# Patient Record
Sex: Male | Born: 1980 | Race: Black or African American | Hispanic: No | Marital: Single | Smoking: Current every day smoker
Health system: Southern US, Community
[De-identification: ages and names within clinical notes are randomized; demographics above are authoritative.]

## PROBLEM LIST (undated history)

## (undated) DIAGNOSIS — H9191 Unspecified hearing loss, right ear: Secondary | ICD-10-CM

## (undated) DIAGNOSIS — N200 Calculus of kidney: Secondary | ICD-10-CM

## (undated) DIAGNOSIS — K2960 Other gastritis without bleeding: Secondary | ICD-10-CM

## (undated) DIAGNOSIS — R1115 Cyclical vomiting syndrome unrelated to migraine: Secondary | ICD-10-CM

## (undated) DIAGNOSIS — T39395A Adverse effect of other nonsteroidal anti-inflammatory drugs [NSAID], initial encounter: Secondary | ICD-10-CM

## (undated) DIAGNOSIS — I4891 Unspecified atrial fibrillation: Secondary | ICD-10-CM

## (undated) DIAGNOSIS — I10 Essential (primary) hypertension: Secondary | ICD-10-CM

## (undated) HISTORY — PX: ESOPHAGOGASTRODUODENOSCOPY: SHX1529

## (undated) HISTORY — PX: CHOLECYSTECTOMY: SHX55

## (undated) HISTORY — PX: INNER EAR SURGERY: SHX679

---

## 2008-07-27 ENCOUNTER — Emergency Department: Payer: Self-pay | Admitting: Unknown Physician Specialty

## 2010-05-21 ENCOUNTER — Emergency Department: Payer: Self-pay | Admitting: Unknown Physician Specialty

## 2010-05-25 ENCOUNTER — Emergency Department: Payer: Self-pay | Admitting: Emergency Medicine

## 2010-05-26 ENCOUNTER — Emergency Department: Payer: Self-pay | Admitting: Emergency Medicine

## 2011-11-02 HISTORY — PX: ESOPHAGOGASTRODUODENOSCOPY: SHX1529

## 2011-11-16 ENCOUNTER — Emergency Department: Payer: Self-pay | Admitting: *Deleted

## 2011-11-16 LAB — TROPONIN I: Troponin-I: 0.04 ng/mL

## 2011-11-16 LAB — CBC
HCT: 52.9 % — ABNORMAL HIGH (ref 40.0–52.0)
HGB: 17.4 g/dL (ref 13.0–18.0)
MCH: 30.7 pg (ref 26.0–34.0)
MCHC: 32.8 g/dL (ref 32.0–36.0)
Platelet: 260 10*3/uL (ref 150–440)
RBC: 5.66 10*6/uL (ref 4.40–5.90)
WBC: 15.3 10*3/uL — ABNORMAL HIGH (ref 3.8–10.6)

## 2011-11-16 LAB — AMYLASE: Amylase: 49 U/L (ref 25–115)

## 2011-11-16 LAB — COMPREHENSIVE METABOLIC PANEL
Alkaline Phosphatase: 93 U/L (ref 50–136)
Bilirubin,Total: 1.2 mg/dL — ABNORMAL HIGH (ref 0.2–1.0)
Creatinine: 1.67 mg/dL — ABNORMAL HIGH (ref 0.60–1.30)
Osmolality: 278 (ref 275–301)
Potassium: 3.4 mmol/L — ABNORMAL LOW (ref 3.5–5.1)
SGPT (ALT): 45 U/L
Sodium: 137 mmol/L (ref 136–145)

## 2011-11-16 LAB — CK TOTAL AND CKMB (NOT AT ARMC)
CK, Total: 1587 U/L — ABNORMAL HIGH (ref 35–232)
CK-MB: 4.9 ng/mL — ABNORMAL HIGH (ref 0.5–3.6)

## 2011-11-17 LAB — COMPREHENSIVE METABOLIC PANEL
Albumin: 5.6 g/dL — ABNORMAL HIGH (ref 3.4–5.0)
Alkaline Phosphatase: 86 U/L (ref 50–136)
BUN: 13 mg/dL (ref 7–18)
Bilirubin,Total: 1.3 mg/dL — ABNORMAL HIGH (ref 0.2–1.0)
Co2: 20 mmol/L — ABNORMAL LOW (ref 21–32)
Creatinine: 1.65 mg/dL — ABNORMAL HIGH (ref 0.60–1.30)
EGFR (African American): >60
Osmolality: 285 (ref 275–301)
Potassium: 3.3 mmol/L — ABNORMAL LOW (ref 3.5–5.1)
SGOT(AST): 45 U/L — ABNORMAL HIGH (ref 15–37)
Sodium: 141 mmol/L (ref 136–145)
Total Protein: 9.6 g/dL — ABNORMAL HIGH (ref 6.4–8.2)

## 2011-11-17 LAB — URINALYSIS, COMPLETE
Nitrite: NEGATIVE
Ph: 5 (ref 4.5–8.0)
Protein: >=500
Squamous Epithelial: NONE SEEN

## 2011-11-17 LAB — LIPASE, BLOOD: Lipase: 111 U/L (ref 73–393)

## 2011-11-17 LAB — CBC
HCT: 52.7 % — ABNORMAL HIGH (ref 40.0–52.0)
HGB: 17.7 g/dL (ref 13.0–18.0)
MCH: 31.7 pg (ref 26.0–34.0)
MCHC: 33.6 g/dL (ref 32.0–36.0)
WBC: 15.6 10*3/uL — ABNORMAL HIGH (ref 3.8–10.6)

## 2011-11-18 ENCOUNTER — Inpatient Hospital Stay: Payer: Self-pay | Admitting: Internal Medicine

## 2011-11-18 LAB — URINALYSIS, COMPLETE
Bacteria: NONE SEEN
Bilirubin,UR: NEGATIVE
Blood: NEGATIVE
Ketone: NEGATIVE
Nitrite: NEGATIVE
Ph: 6 (ref 4.5–8.0)
RBC,UR: 2 /HPF (ref 0–5)
Specific Gravity: 1.028 (ref 1.003–1.030)
Squamous Epithelial: NONE SEEN
WBC UR: 3 /HPF (ref 0–5)

## 2011-11-18 LAB — PROTEIN / CREATININE RATIO, URINE
Creatinine, Urine: 448.1 mg/dL — ABNORMAL HIGH (ref 30.0–125.0)
Protein, Random Urine: 29 mg/dL — ABNORMAL HIGH (ref 0–12)
Protein/Creat. Ratio: 65 mg/gCREAT (ref 0–200)

## 2011-11-18 LAB — PROTIME-INR: Prothrombin Time: 12.9 secs (ref 11.5–14.7)

## 2011-11-18 LAB — HEMOGLOBIN: HGB: 15.6 g/dL (ref 13.0–18.0)

## 2011-11-19 LAB — CBC WITH DIFFERENTIAL/PLATELET
Basophil #: 0 10*3/uL (ref 0.0–0.1)
Basophil %: 0.5 %
Eosinophil #: 0.1 10*3/uL (ref 0.0–0.7)
Eosinophil %: 0.8 %
HCT: 41.3 % (ref 40.0–52.0)
HGB: 13.7 g/dL (ref 13.0–18.0)
Lymphocyte %: 32.3 %
MCH: 31.6 pg (ref 26.0–34.0)
MCHC: 33.2 g/dL (ref 32.0–36.0)
MCV: 95 fL (ref 80–100)
Monocyte #: 1 x10 3/mm (ref 0.2–1.0)
Monocyte %: 12.9 %
Neutrophil %: 53.5 %
Platelet: 157 10*3/uL (ref 150–440)
RDW: 12.5 % (ref 11.5–14.5)
WBC: 7.5 10*3/uL (ref 3.8–10.6)

## 2011-11-19 LAB — BASIC METABOLIC PANEL
Anion Gap: 7 (ref 7–16)
BUN: 10 mg/dL (ref 7–18)
Calcium, Total: 8.3 mg/dL — ABNORMAL LOW (ref 8.5–10.1)
Chloride: 106 mmol/L (ref 98–107)
Creatinine: 1.11 mg/dL (ref 0.60–1.30)
EGFR (African American): >60
Potassium: 3.4 mmol/L — ABNORMAL LOW (ref 3.5–5.1)
Sodium: 139 mmol/L (ref 136–145)

## 2011-11-19 LAB — MAGNESIUM: Magnesium: 1.4 mg/dL — ABNORMAL LOW

## 2011-11-19 LAB — CK: CK, Total: 765 U/L — ABNORMAL HIGH (ref 35–232)

## 2011-11-19 LAB — PROTEIN ELECTROPHORESIS(ARMC)

## 2013-01-07 ENCOUNTER — Emergency Department: Payer: Self-pay | Admitting: Emergency Medicine

## 2013-01-07 LAB — URINALYSIS, COMPLETE
Ketone: NEGATIVE
Nitrite: NEGATIVE
Ph: 6 (ref 4.5–8.0)
Protein: 30
Squamous Epithelial: <1
WBC UR: 4 /HPF (ref 0–5)

## 2013-01-07 LAB — CBC
HCT: 45.2 % (ref 40.0–52.0)
MCH: 32.1 pg (ref 26.0–34.0)
MCHC: 34.8 g/dL (ref 32.0–36.0)
MCV: 92 fL (ref 80–100)
RBC: 4.9 10*6/uL (ref 4.40–5.90)
WBC: 8.5 10*3/uL (ref 3.8–10.6)

## 2013-01-07 LAB — BASIC METABOLIC PANEL
Anion Gap: 7 (ref 7–16)
Calcium, Total: 9.4 mg/dL (ref 8.5–10.1)
Creatinine: 1.17 mg/dL (ref 0.60–1.30)
EGFR (Non-African Amer.): >60
Glucose: 107 mg/dL — ABNORMAL HIGH (ref 65–99)
Osmolality: 281 (ref 275–301)
Potassium: 3.4 mmol/L — ABNORMAL LOW (ref 3.5–5.1)
Sodium: 141 mmol/L (ref 136–145)

## 2013-01-07 LAB — CBC WITH DIFFERENTIAL/PLATELET
Basophil #: 0.1 10*3/uL (ref 0.0–0.1)
Eosinophil #: 0.2 10*3/uL (ref 0.0–0.7)
Eosinophil %: 1.9 %
Lymphocyte #: 3.6 10*3/uL (ref 1.0–3.6)
Monocyte #: 0.8 x10 3/mm (ref 0.2–1.0)
Monocyte %: 9.6 %
Neutrophil #: 3.8 10*3/uL (ref 1.4–6.5)
Neutrophil %: 44.5 %

## 2013-01-09 ENCOUNTER — Emergency Department: Payer: Self-pay | Admitting: Emergency Medicine

## 2013-01-09 LAB — COMPREHENSIVE METABOLIC PANEL
Albumin: 4.4 g/dL (ref 3.4–5.0)
Alkaline Phosphatase: 66 U/L (ref 50–136)
Anion Gap: 6 — ABNORMAL LOW (ref 7–16)
BUN: 9 mg/dL (ref 7–18)
Chloride: 106 mmol/L (ref 98–107)
EGFR (Non-African Amer.): >60
Glucose: 109 mg/dL — ABNORMAL HIGH (ref 65–99)
Potassium: 3 mmol/L — ABNORMAL LOW (ref 3.5–5.1)
SGOT(AST): 21 U/L (ref 15–37)
Sodium: 137 mmol/L (ref 136–145)

## 2013-01-09 LAB — CBC
MCH: 31.2 pg (ref 26.0–34.0)
MCHC: 34.2 g/dL (ref 32.0–36.0)
MCV: 91 fL (ref 80–100)
Platelet: 183 10*3/uL (ref 150–440)
RBC: 5.07 10*6/uL (ref 4.40–5.90)
WBC: 10.8 10*3/uL — ABNORMAL HIGH (ref 3.8–10.6)

## 2013-01-09 LAB — LIPASE, BLOOD: Lipase: 146 U/L (ref 73–393)

## 2013-08-15 ENCOUNTER — Emergency Department: Payer: Self-pay | Admitting: Emergency Medicine

## 2013-08-15 LAB — URINALYSIS, COMPLETE
Bacteria: NONE SEEN
Bilirubin,UR: NEGATIVE
Blood: NEGATIVE
Glucose,UR: NEGATIVE mg/dL (ref 0–75)
LEUKOCYTE ESTERASE: NEGATIVE
Nitrite: NEGATIVE
PH: 8 (ref 4.5–8.0)
Specific Gravity: 1.029 (ref 1.003–1.030)
WBC UR: 1 /HPF (ref 0–5)

## 2013-08-15 LAB — COMPREHENSIVE METABOLIC PANEL
ALBUMIN: 4.5 g/dL (ref 3.4–5.0)
ALK PHOS: 75 U/L
ALT: 31 U/L (ref 12–78)
AST: 32 U/L (ref 15–37)
Anion Gap: 7 (ref 7–16)
BUN: 10 mg/dL (ref 7–18)
Bilirubin,Total: 0.5 mg/dL (ref 0.2–1.0)
CHLORIDE: 104 mmol/L (ref 98–107)
CO2: 25 mmol/L (ref 21–32)
CREATININE: 1.11 mg/dL (ref 0.60–1.30)
Calcium, Total: 9.9 mg/dL (ref 8.5–10.1)
EGFR (African American): >60
EGFR (Non-African Amer.): >60
Glucose: 106 mg/dL — ABNORMAL HIGH (ref 65–99)
Osmolality: 271 (ref 275–301)
POTASSIUM: 3.2 mmol/L — AB (ref 3.5–5.1)
Sodium: 136 mmol/L (ref 136–145)
TOTAL PROTEIN: 8.5 g/dL — AB (ref 6.4–8.2)

## 2013-08-15 LAB — CBC
HCT: 48.6 % (ref 40.0–52.0)
HGB: 16.5 g/dL (ref 13.0–18.0)
MCH: 31 pg (ref 26.0–34.0)
MCHC: 34 g/dL (ref 32.0–36.0)
MCV: 91 fL (ref 80–100)
PLATELETS: 213 10*3/uL (ref 150–440)
RBC: 5.32 10*6/uL (ref 4.40–5.90)
RDW: 12.3 % (ref 11.5–14.5)
WBC: 11.8 10*3/uL — ABNORMAL HIGH (ref 3.8–10.6)

## 2013-08-15 LAB — LIPASE, BLOOD: LIPASE: 149 U/L (ref 73–393)

## 2014-01-01 ENCOUNTER — Emergency Department: Payer: Self-pay | Admitting: Emergency Medicine

## 2014-01-01 LAB — CBC WITH DIFFERENTIAL/PLATELET
BASOS PCT: 0.7 %
Basophil #: 0.1 10*3/uL (ref 0.0–0.1)
EOS PCT: 0.6 %
Eosinophil #: 0.1 10*3/uL (ref 0.0–0.7)
HCT: 51.1 % (ref 40.0–52.0)
HGB: 16.8 g/dL (ref 13.0–18.0)
LYMPHS PCT: 17.4 %
Lymphocyte #: 2.8 10*3/uL (ref 1.0–3.6)
MCH: 30.9 pg (ref 26.0–34.0)
MCHC: 32.9 g/dL (ref 32.0–36.0)
MCV: 94 fL (ref 80–100)
MONO ABS: 1.4 x10 3/mm — AB (ref 0.2–1.0)
Monocyte %: 8.8 %
NEUTROS ABS: 11.6 10*3/uL — AB (ref 1.4–6.5)
Neutrophil %: 72.5 %
Platelet: 225 10*3/uL (ref 150–440)
RBC: 5.44 10*6/uL (ref 4.40–5.90)
RDW: 12.5 % (ref 11.5–14.5)
WBC: 16.1 10*3/uL — ABNORMAL HIGH (ref 3.8–10.6)

## 2014-01-01 LAB — COMPREHENSIVE METABOLIC PANEL
ALK PHOS: 86 U/L
ALT: 26 U/L (ref 12–78)
Albumin: 5.3 g/dL — ABNORMAL HIGH (ref 3.4–5.0)
Anion Gap: 10 (ref 7–16)
BUN: 12 mg/dL (ref 7–18)
Bilirubin,Total: 0.9 mg/dL (ref 0.2–1.0)
CALCIUM: 10.8 mg/dL — AB (ref 8.5–10.1)
Chloride: 103 mmol/L (ref 98–107)
Co2: 25 mmol/L (ref 21–32)
Creatinine: 1.42 mg/dL — ABNORMAL HIGH (ref 0.60–1.30)
EGFR (African American): >60
EGFR (Non-African Amer.): >60
Glucose: 146 mg/dL — ABNORMAL HIGH (ref 65–99)
Osmolality: 278 (ref 275–301)
Potassium: 3.5 mmol/L (ref 3.5–5.1)
SGOT(AST): 36 U/L (ref 15–37)
Sodium: 138 mmol/L (ref 136–145)
Total Protein: 9.6 g/dL — ABNORMAL HIGH (ref 6.4–8.2)

## 2014-01-01 LAB — URINALYSIS, COMPLETE
BACTERIA: NONE SEEN
BILIRUBIN, UR: NEGATIVE
Glucose,UR: NEGATIVE mg/dL (ref 0–75)
Leukocyte Esterase: NEGATIVE
Nitrite: NEGATIVE
Ph: 5 (ref 4.5–8.0)
RBC,UR: 1 /HPF (ref 0–5)
SPECIFIC GRAVITY: 1.028 (ref 1.003–1.030)
SQUAMOUS EPITHELIAL: NONE SEEN
WBC UR: 8 /HPF (ref 0–5)

## 2014-01-01 LAB — LIPASE, BLOOD: LIPASE: 129 U/L (ref 73–393)

## 2014-01-01 LAB — TROPONIN I

## 2014-03-13 ENCOUNTER — Emergency Department: Payer: Self-pay | Admitting: Student

## 2014-03-13 LAB — COMPREHENSIVE METABOLIC PANEL
ANION GAP: 9 (ref 7–16)
Albumin: 5 g/dL (ref 3.4–5.0)
Alkaline Phosphatase: 94 U/L
BUN: 10 mg/dL (ref 7–18)
Bilirubin,Total: 0.7 mg/dL (ref 0.2–1.0)
CREATININE: 1.49 mg/dL — AB (ref 0.60–1.30)
Calcium, Total: 10.3 mg/dL — ABNORMAL HIGH (ref 8.5–10.1)
Chloride: 104 mmol/L (ref 98–107)
Co2: 29 mmol/L (ref 21–32)
Glucose: 128 mg/dL — ABNORMAL HIGH (ref 65–99)
Osmolality: 284 (ref 275–301)
Potassium: 3.2 mmol/L — ABNORMAL LOW (ref 3.5–5.1)
SGOT(AST): 30 U/L (ref 15–37)
SGPT (ALT): 30 U/L
Sodium: 142 mmol/L (ref 136–145)
TOTAL PROTEIN: 9.4 g/dL — AB (ref 6.4–8.2)

## 2014-03-13 LAB — CBC WITH DIFFERENTIAL/PLATELET
BASOS PCT: 0.8 %
Basophil #: 0.1 10*3/uL (ref 0.0–0.1)
EOS PCT: 1.5 %
Eosinophil #: 0.3 10*3/uL (ref 0.0–0.7)
HCT: 50 % (ref 40.0–52.0)
HGB: 16.2 g/dL (ref 13.0–18.0)
Lymphocyte #: 3.8 10*3/uL — ABNORMAL HIGH (ref 1.0–3.6)
Lymphocyte %: 21.4 %
MCH: 31.1 pg (ref 26.0–34.0)
MCHC: 32.4 g/dL (ref 32.0–36.0)
MCV: 96 fL (ref 80–100)
Monocyte #: 1.2 x10 3/mm — ABNORMAL HIGH (ref 0.2–1.0)
Monocyte %: 7 %
NEUTROS ABS: 12.1 10*3/uL — AB (ref 1.4–6.5)
Neutrophil %: 69.3 %
PLATELETS: 234 10*3/uL (ref 150–440)
RBC: 5.21 10*6/uL (ref 4.40–5.90)
RDW: 12.8 % (ref 11.5–14.5)
WBC: 17.5 10*3/uL — ABNORMAL HIGH (ref 3.8–10.6)

## 2014-03-13 LAB — LIPASE, BLOOD: Lipase: 146 U/L (ref 73–393)

## 2014-07-04 ENCOUNTER — Emergency Department (HOSPITAL_COMMUNITY): Payer: Self-pay

## 2014-07-04 ENCOUNTER — Emergency Department (HOSPITAL_COMMUNITY)
Admission: EM | Admit: 2014-07-04 | Discharge: 2014-07-05 | Disposition: A | Discharge status: Home / Self Care | Payer: Self-pay | Attending: Emergency Medicine | Admitting: Emergency Medicine

## 2014-07-04 ENCOUNTER — Encounter (HOSPITAL_COMMUNITY): Payer: Self-pay | Admitting: Emergency Medicine

## 2014-07-04 DIAGNOSIS — R197 Diarrhea, unspecified: Secondary | ICD-10-CM | POA: Insufficient documentation

## 2014-07-04 DIAGNOSIS — Z720 Tobacco use: Secondary | ICD-10-CM | POA: Insufficient documentation

## 2014-07-04 DIAGNOSIS — R61 Generalized hyperhidrosis: Secondary | ICD-10-CM | POA: Insufficient documentation

## 2014-07-04 DIAGNOSIS — Z9049 Acquired absence of other specified parts of digestive tract: Secondary | ICD-10-CM | POA: Insufficient documentation

## 2014-07-04 DIAGNOSIS — R1013 Epigastric pain: Secondary | ICD-10-CM | POA: Insufficient documentation

## 2014-07-04 DIAGNOSIS — R109 Unspecified abdominal pain: Secondary | ICD-10-CM

## 2014-07-04 DIAGNOSIS — I4891 Unspecified atrial fibrillation: Secondary | ICD-10-CM | POA: Insufficient documentation

## 2014-07-04 DIAGNOSIS — R112 Nausea with vomiting, unspecified: Secondary | ICD-10-CM | POA: Insufficient documentation

## 2014-07-04 DIAGNOSIS — R6883 Chills (without fever): Secondary | ICD-10-CM | POA: Insufficient documentation

## 2014-07-04 LAB — URINALYSIS, ROUTINE W REFLEX MICROSCOPIC
Glucose, UA: NEGATIVE mg/dL
Leukocytes, UA: NEGATIVE
NITRITE: NEGATIVE
Protein, ur: 100 mg/dL — AB
Specific Gravity, Urine: 1.035 — ABNORMAL HIGH (ref 1.005–1.030)
UROBILINOGEN UA: 0.2 mg/dL (ref 0.0–1.0)
pH: 6 (ref 5.0–8.0)

## 2014-07-04 LAB — DIFFERENTIAL
Basophils Absolute: 0 10*3/uL (ref 0.0–0.1)
Basophils Relative: 0 % (ref 0–1)
EOS PCT: 1 % (ref 0–5)
Eosinophils Absolute: 0.1 10*3/uL (ref 0.0–0.7)
LYMPHS PCT: 16 % (ref 12–46)
Lymphs Abs: 2.4 10*3/uL (ref 0.7–4.0)
Monocytes Absolute: 0.9 10*3/uL (ref 0.1–1.0)
Monocytes Relative: 6 % (ref 3–12)
Neutro Abs: 11.1 10*3/uL — ABNORMAL HIGH (ref 1.7–7.7)
Neutrophils Relative %: 77 % (ref 43–77)

## 2014-07-04 LAB — COMPREHENSIVE METABOLIC PANEL
ALK PHOS: 64 U/L (ref 39–117)
ALT: 17 U/L (ref 0–53)
ANION GAP: 18 — AB (ref 5–15)
AST: 22 U/L (ref 0–37)
Albumin: 5 g/dL (ref 3.5–5.2)
BUN: 13 mg/dL (ref 6–23)
CALCIUM: 10.4 mg/dL (ref 8.4–10.5)
CO2: 23 mEq/L (ref 19–32)
Chloride: 101 mEq/L (ref 96–112)
Creatinine, Ser: 1.13 mg/dL (ref 0.50–1.35)
GFR calc non Af Amer: 83 mL/min — ABNORMAL LOW (ref >90)
GLUCOSE: 128 mg/dL — AB (ref 70–99)
POTASSIUM: 3.3 meq/L — AB (ref 3.7–5.3)
Sodium: 142 mEq/L (ref 137–147)
TOTAL PROTEIN: 8.6 g/dL — AB (ref 6.0–8.3)
Total Bilirubin: 0.5 mg/dL (ref 0.3–1.2)

## 2014-07-04 LAB — CBC
HCT: 46.8 % (ref 39.0–52.0)
Hemoglobin: 16.6 g/dL (ref 13.0–17.0)
MCH: 32.5 pg (ref 26.0–34.0)
MCHC: 35.5 g/dL (ref 30.0–36.0)
MCV: 91.8 fL (ref 78.0–100.0)
Platelets: 219 10*3/uL (ref 150–400)
RBC: 5.1 MIL/uL (ref 4.22–5.81)
RDW: 12 % (ref 11.5–15.5)
WBC: 14.3 10*3/uL — ABNORMAL HIGH (ref 4.0–10.5)

## 2014-07-04 LAB — I-STAT TROPONIN, ED: TROPONIN I, POC: 0 ng/mL (ref 0.00–0.08)

## 2014-07-04 LAB — URINE MICROSCOPIC-ADD ON

## 2014-07-04 LAB — RAPID URINE DRUG SCREEN, HOSP PERFORMED
AMPHETAMINES: NOT DETECTED
Barbiturates: NOT DETECTED
Benzodiazepines: NOT DETECTED
Cocaine: NOT DETECTED
OPIATES: NOT DETECTED
Tetrahydrocannabinol: POSITIVE — AB

## 2014-07-04 LAB — LIPASE, BLOOD: Lipase: 35 U/L (ref 11–59)

## 2014-07-04 MED ORDER — HALOPERIDOL LACTATE 5 MG/ML IJ SOLN
2.5000 mg | Freq: Once | INTRAMUSCULAR | Status: AC
  Administered 2014-07-04: 2.5 mg via INTRAVENOUS
  Filled 2014-07-04: qty 1

## 2014-07-04 MED ORDER — SODIUM CHLORIDE 0.9 % IV BOLUS (SEPSIS)
1000.0000 mL | Freq: Once | INTRAVENOUS | Status: AC
  Administered 2014-07-04: 1000 mL via INTRAVENOUS

## 2014-07-04 MED ORDER — HYDROMORPHONE HCL 1 MG/ML IJ SOLN
1.0000 mg | Freq: Once | INTRAMUSCULAR | Status: AC
  Administered 2014-07-04: 1 mg via INTRAVENOUS
  Filled 2014-07-04: qty 1

## 2014-07-04 MED ORDER — DILTIAZEM HCL 25 MG/5ML IV SOLN
20.0000 mg | Freq: Once | INTRAVENOUS | Status: AC
  Administered 2014-07-05: 20 mg via INTRAVENOUS
  Filled 2014-07-04: qty 5

## 2014-07-04 MED ORDER — DIPHENHYDRAMINE HCL 50 MG/ML IJ SOLN
25.0000 mg | Freq: Once | INTRAMUSCULAR | Status: AC
  Administered 2014-07-04: 25 mg via INTRAVENOUS
  Filled 2014-07-04: qty 1

## 2014-07-04 NOTE — ED Provider Notes (Signed)
CSN: [id number]     Arrival date & time 07/04/14  2048 History   First MD Initiated Contact with Patient 07/04/14 2110     Chief Complaint  Patient presents with  . Abdominal Pain  . Chest Pain  . Nausea   (Consider location/radiation/quality/duration/timing/severity/associated sxs/prior Treatment) HPI Brandon Allen is a 33 yo male presenting with report of abd pain onset tonight. He reports appr 4 hr PTA he began having epigastric abd pain, followed by nausea, vomiting and profuse sweating.  He reports vomiting multiple times.  He denies any diarrhea.  He also reports feeling very cold and is wrapped in several blankets.  He states this has happened before and is his usual presentation when he has kidney stones. He denies diarrhea, bloody/bilious emesis, or drug use other than marijuana.   History reviewed. No pertinent past medical history. Past Surgical History  Procedure Laterality Date  . Cholecystectomy     No family history on file. History  Substance Use Topics  . Smoking status: Current Every Day Smoker -- 0.50 packs/day    Types: Cigarettes  . Smokeless tobacco: Not on file  . Alcohol Use: No    Review of Systems  Constitutional: Positive for chills and diaphoresis. Negative for fever.  HENT: Negative for sore throat.   Eyes: Negative for visual disturbance.  Respiratory: Negative for cough and shortness of breath.   Cardiovascular: Negative for chest pain and leg swelling.  Gastrointestinal: Positive for nausea, vomiting, abdominal pain and diarrhea.  Genitourinary: Negative for dysuria.  Musculoskeletal: Negative for myalgias.  Skin: Negative for rash.  Neurological: Negative for weakness, numbness and headaches.    Allergies  Review of patient's allergies indicates no known allergies.  Home Medications   Prior to Admission medications   Not on File   BP 156/102 mmHg  Pulse 80  Temp(Src) 98 F (36.7 C) (Oral)  Resp 16  Ht 5\' 11"  (1.803 m)  Wt 210 lb  (95.255 kg)  BMI 29.30 kg/m2  SpO2 100% Physical Exam  Constitutional: He is oriented to person, place, and time. He appears well-developed and well-nourished. He appears ill. No distress.  HENT:  Head: Normocephalic and atraumatic.  Mouth/Throat: Oropharynx is clear and moist. No oropharyngeal exudate.  Eyes: Conjunctivae are normal.  Neck: Neck supple. No thyromegaly present.  Cardiovascular: Intact distal pulses.  An irregular rhythm present. Tachycardia present.  Exam reveals no gallop and no friction rub.   No murmur heard. Pulmonary/Chest: Effort normal and breath sounds normal. No respiratory distress. He has no wheezes. He has no rales. He exhibits no tenderness.  Abdominal: Soft. He exhibits no distension and no mass. There is no hepatosplenomegaly. There is tenderness in the epigastric area. There is no rigidity, no rebound, no guarding, no CVA tenderness, no tenderness at McBurney's point and negative Murphy's sign.  Musculoskeletal: He exhibits no tenderness.  Lymphadenopathy:    He has no cervical adenopathy.  Neurological: He is alert and oriented to person, place, and time.  Skin: Skin is warm. No rash noted. He is diaphoretic. There is pallor.  Psychiatric: He has a normal mood and affect.  Nursing note and vitals reviewed.   ED Course  Procedures (including critical care time) Labs Review Labs Reviewed  CBC - Abnormal; Notable for the following:    WBC 14.3 (*)    All other components within normal limits  URINALYSIS, ROUTINE W REFLEX MICROSCOPIC - Abnormal; Notable for the following:    Color, Urine AMBER (*)  APPearance CLOUDY (*)    Specific Gravity, Urine 1.035 (*)    Hgb urine dipstick TRACE (*)    Bilirubin Urine SMALL (*)    Ketones, ur >80 (*)    Protein, ur 100 (*)    All other components within normal limits  COMPREHENSIVE METABOLIC PANEL - Abnormal; Notable for the following:    Potassium 3.3 (*)    Glucose, Bld 128 (*)    Total Protein 8.6 (*)     GFR calc non Af Amer 83 (*)    Anion gap 18 (*)    All other components within normal limits  DIFFERENTIAL - Abnormal; Notable for the following:    Neutro Abs 11.1 (*)    All other components within normal limits  URINE RAPID DRUG SCREEN (HOSP PERFORMED) - Abnormal; Notable for the following:    Tetrahydrocannabinol POSITIVE (*)    All other components within normal limits  URINE MICROSCOPIC-ADD ON - Abnormal; Notable for the following:    Squamous Epithelial / LPF FEW (*)    All other components within normal limits  LIPASE, BLOOD  I-STAT TROPOININ, ED    Imaging Review  CT ABDOMEN AND PELVIS WITHOUT CONTRAST  TECHNIQUE: Multidetector CT imaging of the abdomen and pelvis was performed following the standard protocol without IV contrast.  COMPARISON: 03/13/2014  FINDINGS: Lung bases are unremarkable.  The patient is status postcholecystectomy. Unenhanced liver shows no biliary ductal dilatation. Unenhanced pancreas, spleen and adrenal glands are unremarkable. Unenhanced kidneys are symmetrical in size. No nephrolithiasis. No hydronephrosis or hydroureter.  No calcified ureteral calculi. Abdominal aorta is unremarkable.  No small bowel obstruction. No ascites or free air. No adenopathy.  Sagittal images of the spine are unremarkable.  Bilateral distal ureter is unremarkable. No calcified calculi are noted within urinary bladder. No pericecal inflammation. Normal appendix clearly visualized in coronal image 19.  Prostate gland and seminal vesicles are unremarkable. Small nonspecific bilateral inguinal lymph nodes are noted. No destructive bony lesions are noted within pelvis.  IMPRESSION: 1. No nephrolithiasis. No hydronephrosis or hydroureter. 2. No calcified ureteral calculi. 3. Normal appendix. No pericecal inflammation. 4. No calcified calculi are noted within urinary bladder. 5. No small bowel obstruction.  EXAM: PORTABLE CHEST - 1  VIEW  COMPARISON: 05/21/2010  FINDINGS: The heart size and mediastinal contours are within normal limits. Both lungs are clear. The visualized skeletal structures are unremarkable.  IMPRESSION: No active disease.    EKG Interpretation   Date/Time:  Thursday July 05 2014 00:17:40 EST Ventricular Rate:  100 PR Interval:  174 QRS Duration: 99 QT Interval:  363 QTC Calculation: 468 R Axis:   102 Text Interpretation:  Atrial fibrillation Right axis deviation Probable  anteroseptal infarct, old ED PHYSICIAN INTERPRETATION AVAILABLE IN [facility] Confirmed by TEST, Record ([id number]) on 07/07/2014 12:48:11 PM      MDM   Final diagnoses:  Abdominal pain  Atrial fibrillation, new onset  Non-intractable vomiting with nausea, vomiting of unspecified type   33 yo male initially appearing ill and distressed, pale and diaphoretic, reports epigastric abd pain and nausea and vomiting.  Pt and mother report this is a recurrent problem and has been evaluated at [facility] without conclusive findings.  At one visit, his gall bladder was removed and his symptoms returned.  At another visit, they saw a non-obstructive stone and the assumption was the symptoms were because of the stone. Case discussed with Dr. Fayrene Fearing.  His labs and imaging are negative for significant abnormality except  for mild leukocytosis and hypokalemia. He also has elevated ketones and an anion gap consistent with dehydration from vomiting.  His ekg on arrival was sinus rhythm with a rate in the 70s.  During his ED visit he went into afib with rvr which is new onset for him.  His rate was controlled with diltiazem but he did not convert to a sinus rhythm.  Consulted Dr. Tresa EndoKelly (cardiology), who reported anti-coagulation not necessary at this time, but pt should be evaluated in their clinic.  Until pt is seen in clinic, he should be given presciption for diltiazem with instructions how to check heart rate and when to take diltiazem  prn.  Teaching provided and pt returned demonstration of to check pulse and count heart rate.  Pt is well-appearing, in no acute distress and heart rate is between 80s to low 100s.   At end of shift, hand-off report given to Dr. Ranae PalmsYelverton, who will assume care of pt.  Pt is still receiving IV potassium repletion, but plan is to discharge as soon as complete assuming heart rate remains rate controlled and pt is still well-appearing and without complaints as he is currently.      Filed Vitals:   07/04/14 2104 07/04/14 2108 07/04/14 2115 07/04/14 2130  BP:  156/102  134/75  Pulse:  80 155   Temp:  98 F (36.7 C)    TempSrc:  Oral    Resp:  16 16 15   Height:  5\' 11"  (1.803 m)    Weight:  210 lb (95.255 kg)    SpO2: 98% 100% 100%    Meds given in ED:  Medications  sodium chloride 0.9 % bolus 1,000 mL (0 mLs Intravenous Stopped 07/04/14 2327)  HYDROmorphone (DILAUDID) injection 1 mg (1 mg Intravenous Given 07/04/14 2147)  haloperidol lactate (HALDOL) injection 2.5 mg (2.5 mg Intravenous Given 07/04/14 2148)  diphenhydrAMINE (BENADRYL) injection 25 mg (25 mg Intravenous Given 07/04/14 2147)  HYDROmorphone (DILAUDID) injection 1 mg (1 mg Intravenous Given 07/04/14 2223)  sodium chloride 0.9 % bolus 1,000 mL (0 mLs Intravenous Stopped 07/05/14 0113)  diltiazem (CARDIZEM) injection 20 mg (0 mg Intravenous Stopped 07/05/14 0005)  potassium chloride 10 mEq in 100 mL IVPB (0 mEq Intravenous Stopped 07/05/14 0218)  diltiazem (CARDIZEM) injection 10 mg (0 mg Intravenous Stopped 07/05/14 0119)    Discharge Medication List as of 07/05/2014  1:08 AM    START taking these medications   Details  diltiazem (CARDIZEM) 30 MG tablet Take 1 tablet (30 mg total) by mouth as needed (Count your heart rate for a full minute.  Take one pill as needed for heart rate greater than 110)., Starting 07/05/2014, Until Discontinued, Print           Harle BattiestElizabeth Cassandra Mcmanaman, NP 07/07/14 1435  Rolland PorterMark James, MD 07/15/14 [id number]

## 2014-07-04 NOTE — ED Notes (Signed)
Per EMS: pt from home for eval of abd pain and cp x2 hours associated with nausea and vomiting after eating chinese food. EMS noted pt was diaphoretic upon arrival, 4 mg of zofran given with no relief and 300 cc of NS. Pt with hx of cholecystectomy. nad noted.

## 2014-07-04 NOTE — ED Notes (Signed)
The patient is unable to give an urine specimen at this time. The tech reported to the RN  In charge.

## 2014-07-04 NOTE — ED Notes (Signed)
Provider informed of vs

## 2014-07-04 NOTE — ED Notes (Signed)
PT states that he cannot provide a urine sample at this time. PT instructed to use call bell when he can urinate.

## 2014-07-04 NOTE — ED Notes (Signed)
Returned from ct 

## 2014-07-04 NOTE — ED Notes (Addendum)
Pt refusing to have rectal temp done, states hx of kidney stones with same symptoms. Pt refusing to have blankets taken off to ensure accurate temp is taken.

## 2014-07-04 NOTE — ED Notes (Signed)
To ct

## 2014-07-05 MED ORDER — DILTIAZEM HCL 30 MG PO TABS: 30.0000 mg | ORAL_TABLET | ORAL | Status: DC | PRN

## 2014-07-05 MED ORDER — POTASSIUM CHLORIDE 10 MEQ/100ML IV SOLN
10.0000 meq | Freq: Once | INTRAVENOUS | Status: AC
  Administered 2014-07-05: 10 meq via INTRAVENOUS
  Filled 2014-07-05: qty 100

## 2014-07-05 MED ORDER — DILTIAZEM HCL 25 MG/5ML IV SOLN
10.0000 mg | Freq: Once | INTRAVENOUS | Status: AC
  Administered 2014-07-05: 10 mg via INTRAVENOUS
  Filled 2014-07-05: qty 5

## 2014-07-05 NOTE — Discharge Instructions (Signed)
Please follow the directions provided.  Be sure to follow-up with the cardiologist to evaluate your heart rhythm.  Check your pulse throughout the day, count your pulse for a full minute, if it is greater than 110 beats per minute, take one pill.  Wait 30 min to an hour to re-check your heart rate.  If your have chest pain, nausea,vomiting or any other new, worsening or concerning symptoms, don't hesitate to return.     SEEK IMMEDIATE MEDICAL CARE IF:  You have chest pain, abdominal pain, sweating, or weakness.  You feel nauseous.  You have shortness of breath.  You suddenly have swollen feet and ankles.  You feel dizzy.  Your face or limbs feel numb or weak.  You have a change in your vision or speech.

## 2014-07-06 ENCOUNTER — Emergency Department: Payer: Self-pay | Admitting: Emergency Medicine

## 2014-07-06 LAB — COMPREHENSIVE METABOLIC PANEL
ALK PHOS: 68 U/L
ALT: 23 U/L
Albumin: 4.5 g/dL (ref 3.4–5.0)
Anion Gap: 8 (ref 7–16)
BUN: 9 mg/dL (ref 7–18)
Bilirubin,Total: 0.9 mg/dL (ref 0.2–1.0)
Calcium, Total: 9.6 mg/dL (ref 8.5–10.1)
Chloride: 107 mmol/L (ref 98–107)
Co2: 26 mmol/L (ref 21–32)
Creatinine: 1.27 mg/dL (ref 0.60–1.30)
EGFR (Non-African Amer.): >60
GLUCOSE: 138 mg/dL — AB (ref 65–99)
OSMOLALITY: 282 (ref 275–301)
POTASSIUM: 4 mmol/L (ref 3.5–5.1)
SGOT(AST): 28 U/L (ref 15–37)
Sodium: 141 mmol/L (ref 136–145)
Total Protein: 8.3 g/dL — ABNORMAL HIGH (ref 6.4–8.2)

## 2014-07-06 LAB — URINALYSIS, COMPLETE
Bacteria: NONE SEEN
Bilirubin,UR: NEGATIVE
GLUCOSE, UR: NEGATIVE mg/dL (ref 0–75)
Leukocyte Esterase: NEGATIVE
Nitrite: NEGATIVE
PH: 5 (ref 4.5–8.0)
RBC,UR: 3 /HPF (ref 0–5)
SPECIFIC GRAVITY: 1.031 (ref 1.003–1.030)
Squamous Epithelial: <1
WBC UR: 3 /HPF (ref 0–5)

## 2014-07-06 LAB — CBC
HCT: 47.6 % (ref 40.0–52.0)
HGB: 15.7 g/dL (ref 13.0–18.0)
MCH: 31.4 pg (ref 26.0–34.0)
MCHC: 32.9 g/dL (ref 32.0–36.0)
MCV: 95 fL (ref 80–100)
Platelet: 210 10*3/uL (ref 150–440)
RBC: 4.99 10*6/uL (ref 4.40–5.90)
RDW: 12.4 % (ref 11.5–14.5)
WBC: 15.8 10*3/uL — AB (ref 3.8–10.6)

## 2014-07-06 LAB — LIPASE, BLOOD: Lipase: 152 U/L (ref 73–393)

## 2014-11-25 NOTE — Discharge Summary (Signed)
PATIENT NAME:  Brandon Allen, Shakeel MR#:  [id number] DATE OF BIRTH:  1980/12/12  DATE OF ADMISSION:  11/18/2011 DATE OF DISCHARGE:  11/19/2011  DISCHARGE DIAGNOSES:  1. Acute gastritis.  2. Acute renal failure.  3. Urinary tract infection.  4. Cholelithiasis.  5. Hypokalemia.  6. Hypertension, controlled.   CONSULTATIONS: Dr. Lutricia FeilPaul Oh.  PROCEDURE:  EGD.   CONDITION: Good.  DISCHARGE MEDICATIONS:  1. Protonix 40 mg p.o. daily for 30 days. 2. Cipro 500 mg p.o. b.i.d. for 3 days.   DIET: Low sodium diet.   ACTIVITY: As tolerated.   OTHER INSTRUCTIONS: Avoid nonsteroidal anti-inflammatory drugs.   FOLLOW-UP CARE: Follow up with primary care physician, Dr. Thurmond ButtsWade, within 1 to 2 weeks.   HOSPITAL COURSE: The patient is a 34 year old African-American male with no significant history but with hypertension, presented to the ED with nausea and vomiting with coffee-ground emesis for three days.  In the  ED, the patient had another episode of coffee ground emesis, and his hemoglobin was around 17.4, and creatinine was 1.65 with an abnormal urinalysis. For a detailed History and Physical examination, please refer to the admission note dictated by Dr. Huey Bienenstockawood Elgergawy.  LABORATORY, DIAGNOSTIC AND RADIOLOGICAL DATA:  The patient's labs on admission showed a white count of 15.6, hemoglobin 17.7, hematocrit 52.7.  Urinalysis: Protein positive.  WBC positive.  CAT scan of the abdomen: No obstruction or inflammatory abnormalities.   HOSPITAL COURSE:  After admission, the patient has been treated with Protonix IV drip.  Dr. Bluford Kaufmannh, GI physician, evaluated the patient with endoscopy which showed gastritis which was possibly caused by NSAIDs.  In addition, the patient was treated with Zofran and IV fluids, kept n.p.o.   Acute renal failure: Dr. Cherylann RatelLateef suggested the patient continue IV fluids and ordered many tests. Today the patient's creatinine is normal.   Hypokalemia: The patient's potassium was low, was  treated with supplement.   Cholelithiasis: Abdominal ultrasound showed cholelithiasis, but according to the patient the patient had gallbladder stones before. In addition, kidney ultrasound showed no hydronephrosis or other acute changes.   Since the patient has a mild urinary tract infection, the patient was treated with Rocephin. White count decreased to 7.5 today. In addition, the patient's creatinine decreased to 1.11. The patient's magnesium is 1.4. He was treated with magnesium oxide today.   PERTINENT DISCHARGE PHYSICAL EXAMINATION:  VITAL SIGNS: The patient's vital signs are stable. Temperature is 97.4, blood pressure 123/73, pulse 56. Physical examination is unremarkable. The patient is clinically stable.   DISPOSITION: According to Dr. Bluford Kaufmannh, the patient can be discharged to home today. I discussed the patient's situation and the discharge plan with the patient.   TIME SPENT: 35 minutes.   ____________________________ Shaune PollackQing Overton Boggus, MD qc:cbb D: 11/19/2011 16:41:04 ET T: 11/20/2011 12:55:41 ET JOB#: [id number] cc: Reginold AgentEugene H. Thurmond ButtsWade, MD Shaune PollackQING Kahleah Crass MD ELECTRONICALLY SIGNED 11/24/2011 6:21

## 2014-11-25 NOTE — Consult Note (Signed)
EGD showed diffuse gastritis. No active bleeding. Bx taken for H.pylori though NSAIDS use likely cause of bleeding. Full liquid diet ordered. Can advance to reg diet if tolerated. Can switch to po protonix. If hgb remains stable and patient can tolerate solids, then ok for discharge in AM on PPI BID and no NSAIDS. thanks.  Electronic Signatures: Lutricia Feilh, Jenine Krisher (MD)  (Signed on 17-Apr-13 12:00)  Authored  Last Updated: 17-Apr-13 12:00 by Lutricia Feilh, Vertie Dibbern (MD)

## 2014-11-25 NOTE — Consult Note (Signed)
Pt seen and examined. Admitted with hematemesis since last night. Was in ER on Sun for abd pain. Given antispasmotic agent?, which made him nauseous. Coffee ground emesis. Hgb stable. Takes ibuprofen, aleve, etc regularly for headaches and muscle aches from lifting weights. Pt npo. Protonix iv ordered. Plan EGD this AM. thanks  Electronic Signatures: Lutricia Feilh, Lekeshia Kram (MD)  (Signed on 17-Apr-13 11:41)  Authored  Last Updated: 17-Apr-13 11:41 by Lutricia Feilh, Blasa Raisch (MD)

## 2014-11-25 NOTE — Consult Note (Signed)
PATIENT NAME:  Brandon Allen, Brandon Allen MR#:  [id number] DATE OF BIRTH:  [birth year]  DATE OF CONSULTATION:  11/18/2011  REFERRING PHYSICIAN:   CONSULTING PHYSICIAN:  Ezzard StandingPaul Y. Wister Hoefle, MD  HISTORY OF PRESENT ILLNESS: Patient is a 34 year old white male with no significant past medical history has been complaining of nausea, vomiting and abdominal pain for the past several days. He started vomiting on Sunday, which is three days ago. The vomitus was dark and coffee ground in color. He also started noticing some dark stools as well on Monday. At that time he was seen in the Emergency Room where he was given some medication. It is unclear what medication he was given. Unfortunately when he took it, it actually made him sicker and more nausea. Because of the worsening symptoms he actually came back to the Emergency Room and was then admitted for further evaluation.   Patient takes ibuprofen and Aleve on a regular basis for his headaches and muscle aches. He has muscle aches from lifting weight. Patient denies any prior history of ulcers or history of reflux.   REVIEW OF SYSTEMS: No fevers or chills. There is no weakness or fatigue. There are no visual or hearing changes. There is no coughing or shortness of breath. There is no chest pain or palpitation. GI symptoms have been described already.   OTHER PAST MEDICAL HISTORY: Hypertension which is controlled with diet.   FAMILY HISTORY: Negative.   SOCIAL HISTORY: He denies alcohol use. He quit smoking last December.   PHYSICAL EXAMINATION:  GENERAL: Patient is in no acute distress.   VITAL SIGNS: His vital signs were stable. He is afebrile.   HEENT: Normocephalic, atraumatic head. Pupils are equally reactive. Throat was clear.   NECK: Supple.   CARDIAC: Regular rhythm and rate without murmurs.   LUNGS: Clear bilaterally.   ABDOMEN: Normoactive bowel sounds, soft. There is some tenderness in the epigastrium. There is no rebound or guarding. There is no  hepatomegaly. There are no palpable masses.   EXTREMITIES: No clubbing, cyanosis, edema.   NEUROLOGICAL: There were no focal deficits. He is alert and oriented.   SKIN: Skin exam was generally normal.   LABORATORY, DIAGNOSTIC, AND RADIOLOGICAL DATA: Sodium 139, potassium 3.4, chloride 106, CO2 26, BUN 10, creatinine 1.1, glucose 90, magnesium level 1.4, CPK 765.   ASSESSMENT AND RECOMMENDATIONS: This is apartment with abdominal pain and nausea and vomiting with hematemesis and melena. We have to be concerned about bleeding ulcers, especially from Aleve and ibuprofen use. Patient is already on Protonix IV. Patient is n.p.o. since admission. Will plan on doing endoscopy later this morning to find out the source of bleeding. Patient agreed.   Thank you for the referral.   ____________________________ Ezzard StandingPaul Y. Bluford Kaufmannh, MD pyo:cms D: 11/26/2011 13:39:37 ET T: 11/26/2011 13:59:25 ET JOB#: [id number]  cc: Ezzard StandingPaul Y. Bluford Kaufmannh, MD, <Dictator> Wallace CullensPAUL Y Nafeesa Dils MD ELECTRONICALLY SIGNED 11/27/2011 9:37

## 2014-11-25 NOTE — H&P (Signed)
PATIENT NAME:  Brandon Allen, Brandon Allen MR#:  [id number] DATE OF BIRTH:  23-Mar-1981  DATE OF ADMISSION:  11/18/2011  PRIMARY CARE PHYSICIAN: Dr. Hassan RowanEugene Wade  REFERRING PHYSICIAN: Dr. Bayard Malesandolph Brown   CHIEF COMPLAINT: Coffee-ground emesis.   HISTORY OF PRESENT ILLNESS: Brandon Allen is a 34 year old male without significant past medical history presents with vomiting, abdominal pain for the last three days. Patient reports he started to have vomiting starting Sunday, multiple episodes, significant amount per day. He said it is a dark in color. As well patient reports he started to notice dark color stools started on Monday. As well patient has been complaining of abdomen pain as well. In ED patient had another episode of coffee-ground emesis. Patient's hemoglobin has been stable around 17.4 yesterday with repeat 17.7 today.   As well patient was found to have elevated creatinine of 1.65 and abnormal urinalysis with some protein and blood present and hyaline casts.   PAST MEDICAL HISTORY: Hypertension, which was controlled with diet.   SURGICAL HISTORY: None.   FAMILY HISTORY: No history of peptic ulcer disease.   SOCIAL HISTORY: Patient is in lawn moving business. Quit smoking 08/03/2011. Smokes marijuana occasionally. No alcohol or drug abuse.   REVIEW OF SYSTEMS: CONSTITUTIONAL: Denies any fever, fatigue, weakness. EYES: Denies any blurry vision, double vision or pain. ENT: Denies any tinnitus, ear pain, hearing loss. RESPIRATORY: Denies any cough, wheezing, hemoptysis. CARDIOVASCULAR: Denies any chest pain, orthopnea, edema. GASTROINTESTINAL: Denies any jaundice, rectal bleed. Has complaints of dark-colored stools, hematemesis, nausea and vomiting. GENITOURINARY: Denies any dysuria, hematuria. ENDO: Denies any polyuria, nocturia, thyroid problems. HEMATOLOGY: Denies any easy bruising, blood clots. NEUROLOGICAL: Denies any numbness, weakness, dysarthria, epilepsy. PSYCH: Denies any alcohol or drug abuse. No  depression or anxiety.   physical exam: vital signs: BP 126/81, temp 98, RR 20, pulse 88, SO2 96%  GENERAL: Well-developed, well-nourished [place], in no acute distress.   HEENT: Normocephalic, atraumatic. Extraocular movements are intact. Pupils equal, round, and reactive to light. No scleral icterus. Conjunctivae are pink. No epistaxis noted. Gross hearing intact. Oral mucosa moist.   NECK: Supple without JVD or lymphadenopathy. no thyromegaly.  LUNGS: Clear to auscultation bilaterally with normal respiratory effort.   HEART: S1 and S2 regular rate and rhythm. No murmurs, rubs, or gallops appreciated.   ABDOMEN: Soft. Mild mid epigastric tenderness noted. No rebound or guarding. No gross organomegaly appreciated.   EXTREMITIES: No clubbing, cyanosis, or edema.   NEUROLOGIC: The patient is alert and oriented to time, person, and place. Strength is 5/5 in both upper and lower extremities.   SKIN: Warm and dry. No rashes noted. Right upper extremity tattoo is noted.   MUSCULOSKELETAL: No joint redness, swelling, or tenderness appreciated.   PSYCHIATRIC: The patient is with an appropriate affect ,AAOX3.     LABORATORY, DIAGNOSTIC AND RADIOLOGICAL DATA: Glucose 159, BUN 13, creatinine 1.65, sodium 141, potassium 3.3, chloride 105, CO2 20.  Total protein 9.6, albumin 5.6, total bilirubin 1.3, alkaline phosphatase 86, AST 45, ALT 45, troponin 0.04.   Total CK 1587.   White blood cell 15.6, hemoglobin 17.7, hematocrit 52.7. Urinalysis protein positive, has hyaline casts and cellular casts.   CT abdomen: No CT evidence of obstructive or inflammatory abnormalities.   ASSESSMENT AND PLAN:  1. Hematemesis. Patient appears to be having upper GI bleed even though with stable hemoglobin will check hemoglobin and hematocrit q.8 hours. Will start him on Protonix drip. Will keep him n.p.o. and will consult GI service.  2.  Renal failure. It is unclear if it is acute or chronic  but patient denies any renal problem so especially given the fact patient has protein in his urine and some hyaline casts and cellular casts and elevated CK total it is unclear if he is having rhabdomyolysis. .Will start patient on fluids and will consult nephrology service.  3. Hypokalemia. Will replace.  4. Will hold any chemical anticoagulation secondary to coffee-ground emesis.  5. CODE STATUS: Patient is FULL CODE.   TOTAL TIME SPENT FOR COORDINATION OF PATIENT CARE: 55 minutes.   ____________________________ Starleen Arms, MD dse:cms D: 11/18/2011 07:07:33 ET T: 11/18/2011 09:13:21 ET JOB#: [id number]  cc: Starleen Arms, MD, <Dictator> Reginold Agent. Thurmond Butts, MD Eilah Common Teena Irani MD ELECTRONICALLY SIGNED 11/19/2011 22:15

## 2014-11-25 NOTE — Consult Note (Signed)
PATIENT NAME:  Brandon Allen, BOROWSKI MR#:  [id number] DATE OF BIRTH:  12-15-1980  DATE OF CONSULTATION:  11/18/2011  REFERRING PHYSICIAN:  Phillips Climes, MD CONSULTING PHYSICIAN:  Plumer Mittelstaedt Lilian Kapur, MD  REASON FOR CONSULTATION: Abnormal renal function tests and proteinuria.   HISTORY OF PRESENT ILLNESS: The patient is a very pleasant 34 year old African American male with past medical history of hypertension, nephrolithiasis, and tobacco abuse who was admitted to [facility] for coffee-ground emesis. The patient presented with vomiting and abdominal pain for three days prior to admission. He originally sought care on Sunday, but was subsequently sent home on Phenergan and another medication that he cannot recall. His symptoms progressed; therefore, he came back for reevaluation. He underwent upper endoscopy today which demonstrated gastritis. The patient revealed that he had been taking significant amounts of NSAIDs, particularly in the form of ibuprofen at home. He reports that he was taking 2 pills twice a day for many, many days. We are consulted for the evaluation and management of abnormal renal function testing, as well as proteinuria. The patient denies history of chronic kidney disease. His baseline creatinine has fluctuated, but it appears on 05/25/2010 his creatinine was 1.28 with an eGFR greater than 60. Currently his creatinine is 1.65. A urinalysis was performed which showed significant amounts of protein, greater than 500 mg/dL. He reports foamy urine at home. In addition, there is a mild bit of hematuria with 9 RBCs per high-power field and there is also significant pyuria with WBCs of 47 per high-power field. Hyalin casts were also noted. CT scan of the abdomen and pelvis was performed, however, kidney size was not mentioned on this study. He also had an abdominal ultrasound; however, it appears that his kidneys were not evaluated on that study.   PAST MEDICAL HISTORY:   1. Hypertension.  2. Nephrolithiasis.  3. History of tobacco abuse, quit smoking in December 2012.   PAST SURGICAL HISTORY: None.   HOME MEDICATIONS: NSAIDs including ibuprofen.     CURRENT INPATIENT MEDICATIONS:  1. Half-normal saline at 100 mL/h. 2. Ceftriaxone 1 gram IV every 24 hours. 3. Dilaudid 1 mg IV every four hours p.r.n. pain.  4. Morphine 2 mg IV every four hours p.r.n. pain.  5. Zofran 4 mg IV every four hours p.r.n. nausea and vomiting. 6. Protonix 40 mg p.o. twice a day.   SOCIAL HISTORY: The patient lives in [place]. He has two children. He is not married. He works in Biomedical scientist. He quit smoking tobacco on 08/03/2011. He smokes marijuana occasionally. He also drinks alcohol very seldom. He denies any illicit drug use, however.   FAMILY HISTORY: Significant for hypertension. No family history of endstage kidney disease or chronic kidney disease, per the patient's report.  REVIEW OF SYSTEMS: CONSTITUTIONAL: Denies fevers, chills, or weight loss. EYES: Denies diplopia or blurry vision. HENT: Has some intermittent headaches for which he takes ibuprofen. Denies hearing loss or tinnitus. CARDIOVASCULAR: Denies chest pain, palpitations, PND, or orthopnea. PULMONARY: Denies cough, shortness of breath, or hemoptysis. GI: Has been having some nausea and vomiting, also had some coffee-ground emesis earlier. GU: Denies frequency, urgency, or dysuria. MUSCULOSKELETAL: Does have some musculoskeletal pain as he lifts weights and takes ibuprofen for this. INTEGUMENTARY: Denies skin rashes or lesions. NEUROLOGIC: Denies focal extremity numbness, weakness, or tingling. PSYCHIATRIC: Denies depression or bipolar disorder. ENDOCRINE: Denies polyuria, polydipsia, or polyphagia.  HEMATOLOGIC/LYMPHATIC: Denies easy bruisability, bleeding, or swollen lymph nodes. ALLERGY/IMMUNOLOGIC: Denies seasonal allergies or history of immunodeficiency.  PHYSICAL EXAMINATION:   VITAL SIGNS: Temperature  98.8, pulse 88, respirations 20, and blood pressure 126/81.   GENERAL: Well-developed, well-nourished African American male who appears his stated age, currently in no acute distress.   HEENT: Normocephalic, atraumatic. Extraocular movements are intact. Pupils equal, round, and reactive to light. No scleral icterus. Conjunctivae are pink. No epistaxis noted. Gross hearing intact. Oral mucosa moist.   NECK: Supple without JVD or lymphadenopathy.   LUNGS: Clear to auscultation bilaterally with normal respiratory effort.   HEART: S1 and S2 regular rate and rhythm. No murmurs, rubs, or gallops appreciated.   ABDOMEN: Soft. Mild mid epigastric tenderness noted. No rebound or guarding. No gross organomegaly appreciated.   EXTREMITIES: No clubbing, cyanosis, or edema.   NEUROLOGIC: The patient is alert and oriented to time, person, and place. Strength is 5/5 in both upper and lower extremities.   SKIN: Warm and dry. No rashes noted. Right upper extremity tattoo is noted.   MUSCULOSKELETAL: No joint redness, swelling, or tenderness appreciated.   PSYCHIATRIC: The patient is with an appropriate affect and appears to have good insight into his current illness.   LABS/STUDIES: Sodium 141, potassium 3.3, chloride 105, CO2 20, BUN 13, creatinine 1.65, and glucose 159. LFTs show total protein 9.6, albumin 5.6, total bilirubin 1.3, alkaline phosphatase 86, AST 45, and ALT 45. Troponin 0.04. CBC shows WBC 15.6, hemoglobin 17, hematocrit 52, and platelets 245. INR 0.9.   Urinalysis shows urine protein greater than 500 mg/dL, 9 RBCs per high-power field, 47 WBCs per high-power field, and 348 hyaline casts.  CK was also noted to be high at 1587.   CT scan of the abdomen and pelvis did not mention kidney size.   Abdominal ultrasound was unremarkable. Kidneys were not evaluated on this study.   IMPRESSION: This is a 34 year old African American male with past medical history of hypertension,  nephrolithiasis, and tobacco abuse who presented to [facility] with coffee-ground emesis and found to have abnormal renal function testing with elevated creatinine and also found to have proteinuria and mild rhabdomyolysis.   PROBLEM LIST:  1. Abnormal labs with elevated creatinine.  2. Proteinuria.  3. Rhabdomyolysis.  4. Hypertension.   PLAN: The patient presents with a very interesting case. It appears that his gastritis was likely due to NSAIDs. He appears to have an elevated creatinine now. However, the chronicity of this is currently unknown. He also had some evidence for proteinuria on the initial urinalysis. We will proceed with extensive serologic work-up including ANA, ANCA antibodies, GBM antibodies, SPEP, UPEP, C3, C4, and urine for eosinophils as a start. We will also obtain a dedicated renal ultrasound to evaluate the size and the shape of the kidneys. Once we further quantify his proteinuria, we will be able to make a better determination in terms of potential etiologies. NSAIDs are known to induce minimal-change disease at times. Alternatively this patient is a young [place] American male and could be at risk for focal segmental glomerulosclerosis. We will await further serologic work-up before recommending any further diagnostic testing. However, he may end up requiring renal biopsy if he is found to have significant proteinuria. I advised the patient to avoid any further NSAID use. Hypertension control will also be important; however, we would like to document stability of creatinine before suggesting an ACE inhibitor.   I would like to thank Dr. Phillips Climes for this interesting referral. Further plan as the patient progresses.  ____________________________ Tama High, MD mnl:slb D:  11/18/2011 15:53:25 ET T: 11/18/2011 16:29:48 ET JOB#: [id number]  cc: Tama High, MD, <Dictator> Mariah Milling Edwena Mayorga MD ELECTRONICALLY SIGNED 12/07/2011 21:08

## 2014-11-25 NOTE — Consult Note (Signed)
Chief Complaint:   Subjective/Chief Complaint Feels much better. No more vomiting. Less abd pain.   VITAL SIGNS/ANCILLARY NOTES: **Vital Signs.:   18-Apr-13 13:47   Vital Signs Type Routine   Temperature Temperature (F) 97.4   Celsius 36.3   Temperature Source oral   Pulse Pulse 56   Pulse source per Dinamap   Respirations Respirations 18   Systolic BP Systolic BP 123   Diastolic BP (mmHg) Diastolic BP (mmHg) 73   Mean BP 89   BP Source Dinamap   Pulse Ox % Pulse Ox % 99   Pulse Ox Activity Level  At rest   Oxygen Delivery Room Air/ 21 %   Brief Assessment:   Cardiac Regular    Respiratory clear BS    Gastrointestinal Normal  lot less tender today   Routine Hem:  18-Apr-13 07:44    Hemoglobin (CBC) 13.3   Assessment/Plan:  Assessment/Plan:   Assessment Gastritis. On protonix    Plan Soft diet ordered. If tolerated, ok for discharge on protonix or PPI bid and no NSAIDS. Pt can f/u with us in few wks. Will sign off.  thanks.   Electronic Signatures: Lutricia Feilh, Zylie Mumaw (MD)  (Signed 18-Apr-13 14:07)  Authored: Chief Complaint, VITAL SIGNS/ANCILLARY NOTES, Brief Assessment, Lab Results, Assessment/Plan   Last Updated: 18-Apr-13 14:07 by Lutricia Feilh, Naomee Nowland (MD)

## 2014-12-19 ENCOUNTER — Encounter: Payer: Self-pay | Admitting: Emergency Medicine

## 2014-12-19 ENCOUNTER — Observation Stay
Admission: EM | Admit: 2014-12-19 | Discharge: 2014-12-21 | Disposition: A | Discharge status: Home / Self Care | Payer: Self-pay | Attending: Internal Medicine | Admitting: Internal Medicine

## 2014-12-19 DIAGNOSIS — R002 Palpitations: Secondary | ICD-10-CM | POA: Insufficient documentation

## 2014-12-19 DIAGNOSIS — E860 Dehydration: Secondary | ICD-10-CM | POA: Insufficient documentation

## 2014-12-19 DIAGNOSIS — Z8249 Family history of ischemic heart disease and other diseases of the circulatory system: Secondary | ICD-10-CM | POA: Insufficient documentation

## 2014-12-19 DIAGNOSIS — R079 Chest pain, unspecified: Secondary | ICD-10-CM

## 2014-12-19 DIAGNOSIS — R61 Generalized hyperhidrosis: Secondary | ICD-10-CM | POA: Insufficient documentation

## 2014-12-19 DIAGNOSIS — I10 Essential (primary) hypertension: Secondary | ICD-10-CM | POA: Diagnosis present

## 2014-12-19 DIAGNOSIS — R112 Nausea with vomiting, unspecified: Secondary | ICD-10-CM | POA: Insufficient documentation

## 2014-12-19 DIAGNOSIS — Z885 Allergy status to narcotic agent status: Secondary | ICD-10-CM | POA: Insufficient documentation

## 2014-12-19 DIAGNOSIS — F17210 Nicotine dependence, cigarettes, uncomplicated: Secondary | ICD-10-CM | POA: Insufficient documentation

## 2014-12-19 DIAGNOSIS — I340 Nonrheumatic mitral (valve) insufficiency: Secondary | ICD-10-CM | POA: Insufficient documentation

## 2014-12-19 DIAGNOSIS — N179 Acute kidney failure, unspecified: Secondary | ICD-10-CM | POA: Insufficient documentation

## 2014-12-19 DIAGNOSIS — Z79899 Other long term (current) drug therapy: Secondary | ICD-10-CM | POA: Insufficient documentation

## 2014-12-19 DIAGNOSIS — Z87442 Personal history of urinary calculi: Secondary | ICD-10-CM | POA: Insufficient documentation

## 2014-12-19 DIAGNOSIS — I4891 Unspecified atrial fibrillation: Secondary | ICD-10-CM

## 2014-12-19 DIAGNOSIS — R1010 Upper abdominal pain, unspecified: Secondary | ICD-10-CM | POA: Insufficient documentation

## 2014-12-19 DIAGNOSIS — R197 Diarrhea, unspecified: Secondary | ICD-10-CM | POA: Insufficient documentation

## 2014-12-19 DIAGNOSIS — I480 Paroxysmal atrial fibrillation: Principal | ICD-10-CM | POA: Insufficient documentation

## 2014-12-19 DIAGNOSIS — Z9049 Acquired absence of other specified parts of digestive tract: Secondary | ICD-10-CM | POA: Insufficient documentation

## 2014-12-19 DIAGNOSIS — R1013 Epigastric pain: Secondary | ICD-10-CM

## 2014-12-19 DIAGNOSIS — D72829 Elevated white blood cell count, unspecified: Secondary | ICD-10-CM | POA: Insufficient documentation

## 2014-12-19 HISTORY — DX: Calculus of kidney: N20.0

## 2014-12-19 HISTORY — DX: Other gastritis without bleeding: K29.60

## 2014-12-19 HISTORY — DX: Unspecified atrial fibrillation: I48.91

## 2014-12-19 HISTORY — DX: Unspecified hearing loss, right ear: H91.91

## 2014-12-19 HISTORY — DX: Adverse effect of other nonsteroidal anti-inflammatory drugs (NSAID), initial encounter: T39.395A

## 2014-12-19 HISTORY — DX: Essential (primary) hypertension: I10

## 2014-12-19 HISTORY — DX: Cyclical vomiting syndrome unrelated to migraine: R11.15

## 2014-12-19 LAB — LACTIC ACID, PLASMA: LACTIC ACID, VENOUS: 2.3 mmol/L — AB (ref 0.5–2.0)

## 2014-12-19 MED ORDER — LORAZEPAM 2 MG/ML IJ SOLN
2.0000 mg | Freq: Once | INTRAMUSCULAR | Status: AC
  Administered 2014-12-19: 2 mg via INTRAVENOUS

## 2014-12-19 MED ORDER — LORAZEPAM 2 MG/ML IJ SOLN
INTRAMUSCULAR | Status: AC
  Administered 2014-12-19: 2 mg via INTRAVENOUS
  Filled 2014-12-19: qty 1

## 2014-12-19 MED ORDER — HYDROMORPHONE HCL 1 MG/ML IJ SOLN
1.0000 mg | Freq: Once | INTRAMUSCULAR | Status: AC
  Administered 2014-12-19: 1 mg via INTRAVENOUS

## 2014-12-19 MED ORDER — SODIUM CHLORIDE 0.9 % IV BOLUS (SEPSIS)
2000.0000 mL | Freq: Once | INTRAVENOUS | Status: AC
  Administered 2014-12-19: 2000 mL via INTRAVENOUS

## 2014-12-19 MED ORDER — HYDROMORPHONE HCL 1 MG/ML IJ SOLN
INTRAMUSCULAR | Status: AC
  Administered 2014-12-19: 1 mg via INTRAVENOUS
  Filled 2014-12-19: qty 1

## 2014-12-19 NOTE — ED Notes (Addendum)
Pt to triage via w/c, wrapped in many blankets, moaning, diaphoretic; Pt reports mid upper abd pain and CP accomp by N/V since 8pm; st hx of same but doesn't know why; pt SB 39-42; charge nurse called for bed; pt now actively vomiting

## 2014-12-19 NOTE — ED Notes (Signed)
   12/19/14 2240  Cardiac  Cardiac (WDL) X  Cardiac Regularity Irregular  Bedside Cardiac Monitor On Yes  Upon arrival to ED room 16, pt alert, with increased respirations, skin clammy with beads of sweat noted.

## 2014-12-19 NOTE — ED Notes (Signed)
After vomiting, noted pt now SVT 180's; taken immediately to room 18 via w/c for further treatment

## 2014-12-19 NOTE — ED Notes (Signed)
   12/19/14 2300  Cardiac  Cardiac (WDL) X  Cardiac Regularity Irregular  Cardiac Rhythm Atrial fibrillation  Bedside Cardiac Monitor On Yes  Pt alert, respirations even and unlabored, skin warm and dry.

## 2014-12-20 ENCOUNTER — Observation Stay: Payer: MEDICAID

## 2014-12-20 ENCOUNTER — Observation Stay: Payer: Self-pay

## 2014-12-20 ENCOUNTER — Observation Stay
Admit: 2014-12-20 | Discharge: 2014-12-20 | Disposition: A | Discharge status: Home / Self Care | Payer: Self-pay | Attending: Internal Medicine | Admitting: Internal Medicine

## 2014-12-20 ENCOUNTER — Emergency Department: Payer: Self-pay

## 2014-12-20 DIAGNOSIS — I10 Essential (primary) hypertension: Secondary | ICD-10-CM

## 2014-12-20 DIAGNOSIS — R079 Chest pain, unspecified: Secondary | ICD-10-CM

## 2014-12-20 DIAGNOSIS — I4891 Unspecified atrial fibrillation: Secondary | ICD-10-CM

## 2014-12-20 HISTORY — DX: Essential (primary) hypertension: I10

## 2014-12-20 LAB — NM MYOCAR MULTI W/SPECT W/WALL MOTION / EF
CHL CUP NUCLEAR SRS: 1
CHL CUP STRESS STAGE 1 HR: 86 {beats}/min
CHL CUP STRESS STAGE 2 HR: 87 {beats}/min
CHL CUP STRESS STAGE 4 HR: 127 {beats}/min
CHL CUP STRESS STAGE 4 SBP: 166 mmHg
CHL CUP STRESS STAGE 5 DBP: 91 mmHg
CHL CUP STRESS STAGE 5 GRADE: 0 %
CHL CUP STRESS STAGE 5 SPEED: 0 mph
CSEPEW: 1 METS
Exercise duration (min): 1 min
LVDIAVOL: 108 mL
LVSYSVOL: 48 mL
NUC STRESS TID: 1.04
Peak HR: 110 {beats}/min
Percent of predicted max HR: 59 %
RATE: 0.58
SDS: 8
SSS: 9
Stage 1 Grade: 0 %
Stage 1 Speed: 0 mph
Stage 2 Grade: 0 %
Stage 2 Speed: 0 mph
Stage 3 Grade: 0 %
Stage 3 HR: 110 {beats}/min
Stage 3 Speed: 0 mph
Stage 4 DBP: 91 mmHg
Stage 4 Grade: 0 %
Stage 4 Speed: 0 mph
Stage 5 HR: 95 {beats}/min
Stage 5 SBP: 171 mmHg

## 2014-12-20 LAB — BASIC METABOLIC PANEL
Anion gap: 17 — ABNORMAL HIGH (ref 5–15)
BUN: 13 mg/dL (ref 6–20)
CALCIUM: 10.8 mg/dL — AB (ref 8.9–10.3)
CHLORIDE: 109 mmol/L (ref 101–111)
CO2: 22 mmol/L (ref 22–32)
CREATININE: 1.26 mg/dL — AB (ref 0.61–1.24)
GFR calc non Af Amer: >60 mL/min (ref >60)
Glucose, Bld: 102 mg/dL — ABNORMAL HIGH (ref 65–99)
Potassium: 3.5 mmol/L (ref 3.5–5.1)
SODIUM: 148 mmol/L — AB (ref 135–145)

## 2014-12-20 LAB — CREATININE, SERUM
CREATININE: 0.98 mg/dL (ref 0.61–1.24)
GFR calc Af Amer: >60 mL/min (ref >60)

## 2014-12-20 LAB — TROPONIN I
Troponin I: <0.03 ng/mL (ref <0.031)
Troponin I: <0.03 ng/mL (ref <0.031)

## 2014-12-20 LAB — URINE DRUG SCREEN, QUALITATIVE (ARMC ONLY)
Amphetamines, Ur Screen: NOT DETECTED
BARBITURATES, UR SCREEN: NOT DETECTED
BENZODIAZEPINE, UR SCRN: NOT DETECTED
COCAINE METABOLITE, UR ~~LOC~~: NOT DETECTED
Cannabinoid 50 Ng, Ur ~~LOC~~: POSITIVE — AB
MDMA (Ecstasy)Ur Screen: NOT DETECTED
Methadone Scn, Ur: NOT DETECTED
OPIATE, UR SCREEN: POSITIVE — AB
PHENCYCLIDINE (PCP) UR S: NOT DETECTED
Tricyclic, Ur Screen: NOT DETECTED

## 2014-12-20 LAB — CBC WITH DIFFERENTIAL/PLATELET
Basophils Absolute: 0.1 10*3/uL (ref 0–0.1)
Basophils Relative: 1 %
Eosinophils Absolute: 0.3 10*3/uL (ref 0–0.7)
Eosinophils Relative: 2 %
HEMATOCRIT: 52.3 % — AB (ref 40.0–52.0)
HEMOGLOBIN: 17.4 g/dL (ref 13.0–18.0)
Lymphocytes Relative: 21 %
Lymphs Abs: 4.5 10*3/uL — ABNORMAL HIGH (ref 1.0–3.6)
MCH: 31.1 pg (ref 26.0–34.0)
MCHC: 33.3 g/dL (ref 32.0–36.0)
MCV: 93.2 fL (ref 80.0–100.0)
MONOS PCT: 7 %
Monocytes Absolute: 1.5 10*3/uL — ABNORMAL HIGH (ref 0.2–1.0)
Neutro Abs: 14.5 10*3/uL — ABNORMAL HIGH (ref 1.4–6.5)
Neutrophils Relative %: 69 %
Platelets: 251 10*3/uL (ref 150–440)
RBC: 5.61 MIL/uL (ref 4.40–5.90)
RDW: 12.6 % (ref 11.5–14.5)
WBC: 21 10*3/uL — ABNORMAL HIGH (ref 3.8–10.6)

## 2014-12-20 LAB — URINALYSIS COMPLETE WITH MICROSCOPIC (ARMC ONLY)
Bacteria, UA: NONE SEEN
Bilirubin Urine: NEGATIVE
GLUCOSE, UA: NEGATIVE mg/dL
Hgb urine dipstick: NEGATIVE
LEUKOCYTES UA: NEGATIVE
NITRITE: NEGATIVE
Protein, ur: 30 mg/dL — AB
Specific Gravity, Urine: 1.023 (ref 1.005–1.030)
Squamous Epithelial / LPF: NONE SEEN
pH: 6 (ref 5.0–8.0)

## 2014-12-20 LAB — HEPATIC FUNCTION PANEL
ALBUMIN: 5.6 g/dL — AB (ref 3.5–5.0)
ALK PHOS: 68 U/L (ref 38–126)
ALT: 18 U/L (ref 17–63)
AST: 30 U/L (ref 15–41)
BILIRUBIN TOTAL: 0.7 mg/dL (ref 0.3–1.2)
TOTAL PROTEIN: 9.5 g/dL — AB (ref 6.5–8.1)

## 2014-12-20 LAB — CBC
HEMATOCRIT: 41.7 % (ref 40.0–52.0)
HEMOGLOBIN: 14.4 g/dL (ref 13.0–18.0)
MCH: 31.8 pg (ref 26.0–34.0)
MCHC: 34.6 g/dL (ref 32.0–36.0)
MCV: 91.9 fL (ref 80.0–100.0)
Platelets: 169 10*3/uL (ref 150–440)
RBC: 4.53 MIL/uL (ref 4.40–5.90)
RDW: 12.2 % (ref 11.5–14.5)
WBC: 10.4 10*3/uL (ref 3.8–10.6)

## 2014-12-20 LAB — TSH: TSH: 0.734 u[IU]/mL (ref 0.350–4.500)

## 2014-12-20 LAB — LIPASE, BLOOD: Lipase: 42 U/L (ref 22–51)

## 2014-12-20 LAB — LACTIC ACID, PLASMA: LACTIC ACID, VENOUS: 0.7 mmol/L (ref 0.5–2.0)

## 2014-12-20 MED ORDER — PROMETHAZINE HCL 25 MG/ML IJ SOLN
12.5000 mg | INTRAMUSCULAR | Status: DC | PRN
  Administered 2014-12-20 – 2014-12-21 (×3): 12.5 mg via INTRAVENOUS
  Filled 2014-12-20 (×3): qty 1

## 2014-12-20 MED ORDER — TECHNETIUM TC 99M SESTAMIBI - CARDIOLITE
13.5360 | Freq: Once | INTRAVENOUS | Status: AC | PRN
  Administered 2014-12-20: 13:00:00 13.53 via INTRAVENOUS

## 2014-12-20 MED ORDER — HYDROMORPHONE HCL 1 MG/ML IJ SOLN
1.0000 mg | INTRAMUSCULAR | Status: DC | PRN
  Administered 2014-12-20 – 2014-12-21 (×6): 1 mg via INTRAVENOUS
  Filled 2014-12-20 (×6): qty 1

## 2014-12-20 MED ORDER — PROMETHAZINE HCL 25 MG PO TABS
25.0000 mg | ORAL_TABLET | Freq: Four times a day (QID) | ORAL | Status: DC | PRN
  Administered 2014-12-20: 25 mg via ORAL
  Filled 2014-12-20 (×2): qty 1

## 2014-12-20 MED ORDER — REGADENOSON 0.4 MG/5ML IV SOLN
0.4000 mg | Freq: Once | INTRAVENOUS | Status: AC
  Administered 2014-12-20: 0.4 mg via INTRAVENOUS

## 2014-12-20 MED ORDER — ONDANSETRON HCL 4 MG/2ML IJ SOLN: 4.0000 mg | Freq: Four times a day (QID) | INTRAMUSCULAR | Status: DC | PRN

## 2014-12-20 MED ORDER — ACETAMINOPHEN 325 MG PO TABS
650.0000 mg | ORAL_TABLET | Freq: Four times a day (QID) | ORAL | Status: DC | PRN
  Administered 2014-12-21: 650 mg via ORAL
  Filled 2014-12-20: qty 2

## 2014-12-20 MED ORDER — HYDROMORPHONE HCL 1 MG/ML IJ SOLN
1.0000 mg | Freq: Once | INTRAMUSCULAR | Status: AC
Start: 2014-12-20 — End: 2014-12-20
  Administered 2014-12-20: 1 mg via INTRAVENOUS

## 2014-12-20 MED ORDER — ONDANSETRON HCL 4 MG/2ML IJ SOLN
4.0000 mg | Freq: Once | INTRAMUSCULAR | Status: AC
  Administered 2014-12-20: 4 mg via INTRAVENOUS

## 2014-12-20 MED ORDER — TECHNETIUM TC 99M SESTAMIBI - CARDIOLITE
30.0000 | Freq: Once | INTRAVENOUS | Status: AC | PRN
  Administered 2014-12-20: 14:00:00 32.88 via INTRAVENOUS

## 2014-12-20 MED ORDER — HYDROMORPHONE HCL 1 MG/ML IJ SOLN
INTRAMUSCULAR | Status: AC
  Administered 2014-12-20: 1 mg via INTRAVENOUS
  Filled 2014-12-20: qty 1

## 2014-12-20 MED ORDER — DILTIAZEM HCL 30 MG PO TABS: 30.0000 mg | ORAL_TABLET | ORAL | Status: DC | PRN

## 2014-12-20 MED ORDER — PANTOPRAZOLE SODIUM 40 MG PO TBEC
40.0000 mg | DELAYED_RELEASE_TABLET | Freq: Every day | ORAL | Status: DC
  Administered 2014-12-20: 40 mg via ORAL
  Filled 2014-12-20: qty 1

## 2014-12-20 MED ORDER — IOHEXOL 350 MG/ML SOLN
100.0000 mL | Freq: Once | INTRAVENOUS | Status: AC | PRN
  Administered 2014-12-20: 100 mL via INTRAVENOUS

## 2014-12-20 MED ORDER — SODIUM CHLORIDE 0.9 % IV SOLN
INTRAVENOUS | Status: DC
  Administered 2014-12-20 – 2014-12-21 (×4): via INTRAVENOUS

## 2014-12-20 MED ORDER — ONDANSETRON HCL 4 MG PO TABS: 4.0000 mg | ORAL_TABLET | Freq: Four times a day (QID) | ORAL | Status: DC | PRN

## 2014-12-20 MED ORDER — SODIUM CHLORIDE 0.9 % IJ SOLN
3.0000 mL | Freq: Two times a day (BID) | INTRAMUSCULAR | Status: DC
  Administered 2014-12-20 (×3): 3 mL via INTRAVENOUS

## 2014-12-20 MED ORDER — HEPARIN SODIUM (PORCINE) 5000 UNIT/ML IJ SOLN
5000.0000 [IU] | Freq: Three times a day (TID) | INTRAMUSCULAR | Status: DC
  Administered 2014-12-20 – 2014-12-21 (×4): 5000 [IU] via SUBCUTANEOUS
  Filled 2014-12-20 (×4): qty 1

## 2014-12-20 MED ORDER — SODIUM CHLORIDE 0.9 % IV BOLUS (SEPSIS)
1000.0000 mL | Freq: Once | INTRAVENOUS | Status: AC
  Administered 2014-12-20: 1000 mL via INTRAVENOUS

## 2014-12-20 MED ORDER — ACETAMINOPHEN 650 MG RE SUPP: 650.0000 mg | Freq: Four times a day (QID) | RECTAL | Status: DC | PRN

## 2014-12-20 MED ORDER — ONDANSETRON HCL 4 MG/2ML IJ SOLN
INTRAMUSCULAR | Status: AC
  Administered 2014-12-20: 4 mg via INTRAVENOUS
  Filled 2014-12-20: qty 2

## 2014-12-20 NOTE — Progress Notes (Signed)
Patient back on floor from stress test. Dr. Mariah MillingGollan at bedside. York SpanielSaid it would be a few hours before stress test can be read. Patient complaining of LUQ pain and an episode of vomiting while in nuclear med. Adella NissenBailey, Tavious Griesinger G

## 2014-12-20 NOTE — ED Notes (Signed)
[facility] Charity fundraiserN. Pt to go to room 258 via stretcher.

## 2014-12-20 NOTE — ED Notes (Signed)
Consult at bedside.

## 2014-12-20 NOTE — Discharge Summary (Signed)
[facility] [place] [facility]   PATIENT NAME: Brandon HuhKevin Allen    MR#:  [id number]  DATE OF BIRTH:  08/18/1980  DATE OF ADMISSION:  12/19/2014 ADMITTING PHYSICIAN: Wyatt Hasteavid K Hower, MD  DATE OF DISCHARGE: 12/20/2014   PRIMARY CARE PHYSICIAN: PROVIDER NOT IN SYSTEM    ADMISSION DIAGNOSIS:  Chest pain at rest [R07.9] Atrial fibrillation with rapid ventricular response [I48.91] Abdominal pain, acute, epigastric [R10.13]  DISCHARGE DIAGNOSIS:  Principal Problem:   Atrial fibrillation with rapid ventricular response Active Problems:   HTN (hypertension)   SECONDARY DIAGNOSIS:   Past Medical History  Diagnosis Date  . HTN (hypertension) 12/20/2014    HOSPITAL COURSE:   34 year old African gentleman history of essential hypertension who originally presented for nausea, vomiting, chest tightness found to be in rapid atrial fibrillation.  1. New onset A. fib with rapid ventricular response:    Troponin negative, monitored on tele- spontaneous conversion to NSR.   TSH and electrolytes are within normal range.   Had some dehydration when came as a result for vomiting and diarrhea for last 2 days.    Likely that is the reason for A fib.    Low [place] score and converted to NSR- no need for anticoagulants.    Cardio decided to do Echo and stress test as his brother had CAD.  2. Abdominal pain:     no acute finding on CT, likely his vomiting and diarrhea was a viral gastritis- feel better now.  3. Essential hypertension: BP stable.  4.Venous thromboembolism prophylactic: Heparin subcutaneous  DISCHARGE CONDITIONS:   stable  CONSULTS OBTAINED:  Treatment Team:  Wyatt Hasteavid K Hower, MD Antonieta Ibaimothy J Gollan, MD  DRUG ALLERGIES:   Allergies  Allergen Reactions  . Morphine And Related Nausea And Vomiting    DISCHARGE MEDICATIONS:   Current Discharge Medication List    CONTINUE these medications which have NOT CHANGED   Details  acetaminophen (TYLENOL)  500 MG tablet Take 500 mg by mouth every 6 (six) hours as needed.    diltiazem (CARDIZEM) 30 MG tablet Take 1 tablet (30 mg total) by mouth as needed (Count your heart rate for a full minute.  Take one pill as needed for heart rate greater than 110). Qty: 20 tablet, Refills: 0         DISCHARGE INSTRUCTIONS:    folow with cardiology clinic in 2 weeks.  If you experience worsening of your admission symptoms, develop shortness of breath, life threatening emergency, suicidal or homicidal thoughts you must seek medical attention immediately by calling 911 or calling your MD immediately  if symptoms less severe.  You Must read complete instructions/literature along with all the possible adverse reactions/side effects for all the Medicines you take and that have been prescribed to you. Take any new Medicines after you have completely understood and accept all the possible adverse reactions/side effects.   Please note  You were cared for by a hospitalist during your hospital stay. If you have any questions about your discharge medications or the care you received while you were in the hospital after you are discharged, you can call the unit and asked to speak with the hospitalist on call if the hospitalist that took care of you is not available. Once you are discharged, your primary care physician will handle any further medical issues. Please note that NO REFILLS for any discharge medications will be authorized once you are discharged, as it is imperative that you return to your primary  care physician (or establish a relationship with a primary care physician if you do not have one) for your aftercare needs so that they can reassess your need for medications and monitor your lab values.    Today   CHIEF COMPLAINT:   Chief Complaint  Patient presents with  . Abdominal Pain    HISTORY OF PRESENT ILLNESS:  Brandon Allen  is a 34 y.o. male with a known history of essential hypertension who is  presenting with nausea and vomiting. He describes 3-4 day duration of abdominal pain, epigastric in location, described only as "pain" intensity ranging from 7-8 out of 10, and no worsening or relieving factors. He describes having associated nausea vomiting for 1 day duration. With today's events he also noted chest tightness without frank chest pain. On arrival to the emergency department he was noted to be in rapid atrial fibrillation heart rate 180s. Since arrival his heart rate has improved as well as his symptoms.  VITAL SIGNS:  Blood pressure 135/85, pulse 81, temperature 98.1 F (36.7 C), temperature source Oral, resp. rate 18, height 6' (1.829 m), weight 98.567 kg (217 lb 4.8 oz), SpO2 99 %.  I/O:    Intake/Output Summary (Last 24 hours) at 12/20/14 1317 Last data filed at 12/20/14 0830  Gross per 24 hour  Intake     50 ml  Output      0 ml  Net     50 ml    PHYSICAL EXAMINATION:  GENERAL:  34 y.o.-year-old patient lying in the bed with no acute distress.  EYES: Pupils equal, round, reactive to light and accommodation. No scleral icterus. Extraocular muscles intact.  HEENT: Head atraumatic, normocephalic. Oropharynx and nasopharynx clear.  NECK:  Supple, no jugular venous distention. No thyroid enlargement, no tenderness.  LUNGS: Normal breath sounds bilaterally, no wheezing, rales,rhonchi or crepitation. No use of accessory muscles of respiration.  CARDIOVASCULAR: S1, S2 normal. No murmurs, rubs, or gallops.  ABDOMEN: Soft, non-tender, non-distended. Bowel sounds present. No organomegaly or mass.  EXTREMITIES: No pedal edema, cyanosis, or clubbing.  NEUROLOGIC: Cranial nerves II through XII are intact. Muscle strength 5/5 in all extremities. Sensation intact. Gait not checked.  PSYCHIATRIC: The patient is alert and oriented x 3.  SKIN: No obvious rash, lesion, or ulcer.   DATA REVIEW:   CBC  Recent Labs Lab 12/20/14 0434  WBC 10.4  HGB 14.4  HCT 41.7  PLT 169     Chemistries   Recent Labs Lab 12/19/14 2245 12/20/14 0434  NA 148*  --   K 3.5  --   CL 109  --   CO2 22  --   GLUCOSE 102*  --   BUN 13  --   CREATININE 1.26* 0.98  CALCIUM 10.8*  --   AST 30  --   ALT 18  --   ALKPHOS 68  --   BILITOT 0.7  --     Cardiac Enzymes  Recent Labs Lab 12/20/14 0958  TROPONINI <0.03    Microbiology Results  Results for orders placed or performed in visit on 11/18/11  Urine culture     Status: None   Collection Time: 11/18/11  7:42 PM  Result Value Ref Range Status   Micro Text Report   Final       SOURCE: CC    COMMENT                   NO GROWTH IN 36 HOURS   ANTIBIOTIC  RADIOLOGY:  Ct Abdomen Pelvis W Contrast  12/20/2014   CLINICAL DATA:  Severe central chest and upper abdominal pain for 2 days.  EXAM: CT ANGIOGRAPHY CHEST  CT ABDOMEN AND PELVIS WITH CONTRAST  TECHNIQUE: Multidetector CT imaging of the chest was performed using the standard protocol during bolus administration of intravenous contrast. Multiplanar CT image reconstructions and MIPs were obtained to evaluate the vascular anatomy. Multidetector CT imaging of the abdomen and pelvis was performed using the standard protocol during bolus administration of intravenous contrast.  CONTRAST:  OMNIPAQUE IOHEXOL 350 MG/ML SOLN  COMPARISON:  CT abdomen/ pelvis 07/04/2014  FINDINGS: CTA CHEST FINDINGS  Normal caliber thoracic aorta without aneurysm, dissection, or acute aortic syndrome. There is a conventional branching pattern from the aortic arch. No significant atherosclerotic disease. There are no filling defects in the central pulmonary arteries to suggest pulmonary embolus. The heart size is normal. There is no pleural or pericardial effusion. No mediastinal or hilar adenopathy.  Mild dependent linear densities in the right and left lower lobe consistent with atelectasis. The lungs are otherwise clear. No  consolidation to suggest pneumonia. No findings of pulmonary edema. No pulmonary nodule or mass. There are no acute or suspicious osseous abnormalities.  CT ABDOMEN and PELVIS FINDINGS  The liver, spleen, pancreas, and adrenal glands are normal. The gallbladder is not seen, surgically absent. Minimal prominence of the common bile duct is a common finding postcholecystectomy. The kidneys demonstrate symmetric enhancement without hydronephrosis or localizing abnormality.  Stomach is physiologically distended. There are no dilated or thickened bowel loops. The appendix is normal. Small volume of stool throughout the colon, no colonic abnormality. No free air, free fluid, or intra-abdominal fluid collection.  No retroperitoneal adenopathy. Abdominal aorta is normal in caliber.  Within the pelvis the bladder is physiologically distended. Prostate gland is normal. No pelvic free fluid or adenopathy.  There are no acute or suspicious osseous abnormalities. Lucency within L5 vertebra extending to the superior endplate is unchanged dating back to 2013 and considered benign. Scattered bone islands are seen.  Review of the MIP images confirms the above findings.  IMPRESSION: 1. Mild dependent atelectasis in the right and left lower lobe. Otherwise normal CT of the chest. Normal thoracic aorta. 2. No acute abnormality in the abdomen/pelvis.  Postcholecystectomy.   Electronically Signed   By: Rubye Oaks M.D.   On: 12/20/2014 02:53   Ct Angio Chest Aorta W/cm &/or Wo/cm  12/20/2014   CLINICAL DATA:  Severe central chest and upper abdominal pain for 2 days.  EXAM: CT ANGIOGRAPHY CHEST  CT ABDOMEN AND PELVIS WITH CONTRAST  TECHNIQUE: Multidetector CT imaging of the chest was performed using the standard protocol during bolus administration of intravenous contrast. Multiplanar CT image reconstructions and MIPs were obtained to evaluate the vascular anatomy. Multidetector CT imaging of the abdomen and pelvis was performed  using the standard protocol during bolus administration of intravenous contrast.  CONTRAST:  OMNIPAQUE IOHEXOL 350 MG/ML SOLN  COMPARISON:  CT abdomen/ pelvis 07/04/2014  FINDINGS: CTA CHEST FINDINGS  Normal caliber thoracic aorta without aneurysm, dissection, or acute aortic syndrome. There is a conventional branching pattern from the aortic arch. No significant atherosclerotic disease. There are no filling defects in the central pulmonary arteries to suggest pulmonary embolus. The heart size is normal. There is no pleural or pericardial effusion. No mediastinal or hilar adenopathy.  Mild dependent linear densities in the right and left lower lobe consistent with atelectasis. The lungs are otherwise clear.  No consolidation to suggest pneumonia. No findings of pulmonary edema. No pulmonary nodule or mass. There are no acute or suspicious osseous abnormalities.  CT ABDOMEN and PELVIS FINDINGS  The liver, spleen, pancreas, and adrenal glands are normal. The gallbladder is not seen, surgically absent. Minimal prominence of the common bile duct is a common finding postcholecystectomy. The kidneys demonstrate symmetric enhancement without hydronephrosis or localizing abnormality.  Stomach is physiologically distended. There are no dilated or thickened bowel loops. The appendix is normal. Small volume of stool throughout the colon, no colonic abnormality. No free air, free fluid, or intra-abdominal fluid collection.  No retroperitoneal adenopathy. Abdominal aorta is normal in caliber.  Within the pelvis the bladder is physiologically distended. Prostate gland is normal. No pelvic free fluid or adenopathy.  There are no acute or suspicious osseous abnormalities. Lucency within L5 vertebra extending to the superior endplate is unchanged dating back to 2013 and considered benign. Scattered bone islands are seen.  Review of the MIP images confirms the above findings.  IMPRESSION: 1. Mild dependent atelectasis in the  right and left lower lobe. Otherwise normal CT of the chest. Normal thoracic aorta. 2. No acute abnormality in the abdomen/pelvis.  Postcholecystectomy.   Electronically Signed   By: Rubye OaksMelanie  Ehinger M.D.   On: 12/20/2014 02:53    Management plans discussed with the patient, family and they are in agreement.  CODE STATUS: full code TOTAL TIME TAKING CARE OF THIS PATIENT: 35  minutes.    Altamese DillingVACHHANI, Brix Brearley M.D on 12/20/2014 at 1:17 PM  Between 7am to 6pm - Pager - [phone]  After 6pm go to [email or web address] - password [email or web address]  [facility]  Office  [phone]  CC: Primary care physician; PROVIDER NOT IN SYSTEM

## 2014-12-20 NOTE — Consult Note (Addendum)
Cardiology Consultation Note  Patient ID: Brandon HuhKevin Segers, MRN: [id number], DOB/AGE: 35/04/1981 35 y.o. Admit date: 12/19/2014   Date of Consult: 12/20/2014 Primary Physician: PROVIDER NOT IN SYSTEM Primary Cardiologist: [facility]  Chief Complaint: Epigastric abdominal pain, chest pain, nausea, vomiting, and diaphoresis x 2-3 days Reason for Consult: A-fib with RVR and chest pain  HPI: 34 y.o. male with h/o HTN and status post cholecystectomy 11/2013 who [facility] with 2-3 day history of epigastric abdominal pain, chest pain, nausea, vomiting, diaphoresis and a 1 day history of palpitations who was found to be in new onset a-fib with RVR with heart rate in the 180s.   He has no previously known cardiac history and has never had a stress test or cardiac catheterization. He is fairly active at baseline and runs on the treadmill a couple of times per week without symptoms. He previously underwent successful laparoscopic cholecystomy on 11/2013. Since undergoing his cholecystectomy he has changed his diet and mainly eats backed foods, fish, chicken,and fruits. [facility] for severe abdominal pain that was traced to undiagnosed source at that time after undergoing CT scan. He continues to smoke <1 pack of cigarettes daily and has done so for the past 10 years. He smokes marijuana and denies ever using any other illicit drug. He also denies any alcohol use.  He does report his younger brother did receive PCI/stenting at age 35. He reports his younger brother was not a smoker. He did drink and ate poorly. He reports he mother and father are in good health. His grandmother has history of CHF.   Beginning 2-3 days ago he started to have severe epigastric abdominal pain that was associated with nausea and vomiting. There was also some associated diaphoresis. He did have some left over Phenergan and "pain pills" from his cholecystectomy and took those with help at first, however  his symptoms returned with associated chest pain prompting him to present [facility]. He did not have associated palpitations until his [facility].   [facility] he was noted to be in new onset a-fib with RVR with heart rate in the 180s. He was complaining of the severe epigastric pain. He was noted to be diaphoretic. Vitals were stable. He was given Dilaudid and Ativan with improvement in symptoms. CT abdomen/pelvis and CTA angio of the chest showed mild dependent atelectasis in the right and left lower lobe, otherwise normal CT of the chest and thoracic aorta. No acute abnormality in the abdomen or pelvis. Urine drug screen was positive for opiates and MJ. Troponin negative x 2 to date. EKG showed a-fib with RVR, 184 bpm, nonspecific st/t changes. WBC 21-->10.4, lipase 42, Ca 10.8. Echo is pending. This morning he is pain free. He was placed on diltiazem 30 mg prn by IM. He did convert to NSR at 10:20 AM this morning.   Past Medical History  Diagnosis Date  . HTN (hypertension) 12/20/2014      Most Recent Cardiac Studies: None   Surgical History:  Past Surgical History  Procedure Laterality Date  . Cholecystectomy       Home Meds: Prior to Admission medications   Medication Sig Start Date End Date Taking? Authorizing Provider  acetaminophen (TYLENOL) 500 MG tablet Take 500 mg by mouth every 6 (six) hours as needed.    Historical Provider, MD  diltiazem (CARDIZEM) 30 MG tablet Take 1 tablet (30 mg total) by  mouth as needed (Count your heart rate for a full minute.  Take one pill as needed for heart rate greater than 110). 07/05/14   Harle Battiest, NP    Inpatient Medications:  . heparin  5,000 Units Subcutaneous 3 times per day  . sodium chloride  3 mL Intravenous Q12H   . sodium chloride 100 mL/hr at 12/20/14 0530    Allergies:  Allergies  Allergen Reactions  . Morphine And Related Nausea And Vomiting    History   Social History  . Marital Status:  Single    Spouse Name: N/A  . Number of Children: N/A  . Years of Education: N/A   Occupational History  . Not on file.   Social History Main Topics  . Smoking status: Current Every Day Smoker -- 0.50 packs/day    Types: Cigarettes  . Smokeless tobacco: Not on file  . Alcohol Use: No  . Drug Use: Yes    Special: Marijuana  . Sexual Activity: Not on file   Other Topics Concern  . Not on file   Social History Narrative     Family History  Problem Relation Age of Onset  . Heart failure Other      Review of Systems: Review of Systems  Constitutional: Positive for diaphoresis. Negative for fever, chills, weight loss and malaise/fatigue.  HENT: Negative for congestion and sore throat.   Eyes: Negative for discharge and redness.  Respiratory: Positive for shortness of breath. Negative for cough, hemoptysis, sputum production and wheezing.   Cardiovascular: Positive for chest pain and palpitations. Negative for orthopnea, claudication, leg swelling and PND.  Gastrointestinal: Positive for nausea, vomiting and abdominal pain. Negative for heartburn, diarrhea, constipation, blood in stool and melena.  Genitourinary: Negative for dysuria, urgency and frequency.  Musculoskeletal: Negative for myalgias and falls.  Skin: Negative for rash.  Neurological: Negative for dizziness, sensory change, speech change, focal weakness and weakness.  Psychiatric/Behavioral: Negative for depression. The patient is not nervous/anxious.   All other systems were reviewed and negative.   Labs:  Recent Labs  12/19/14 2245 12/20/14 0434  TROPONINI <0.03 <0.03   Lab Results  Component Value Date   WBC 10.4 12/20/2014   HGB 14.4 12/20/2014   HCT 41.7 12/20/2014   MCV 91.9 12/20/2014   PLT 169 12/20/2014    Recent Labs Lab 12/19/14 2245 12/20/14 0434  NA 148*  --   K 3.5  --   CL 109  --   CO2 22  --   BUN 13  --   CREATININE 1.26* 0.98  CALCIUM 10.8*  --   PROT 9.5*  --   BILITOT  0.7  --   ALKPHOS 68  --   ALT 18  --   AST 30  --   GLUCOSE 102*  --    No results found for: CHOL, HDL, LDLCALC, TRIG No results found for: DDIMER  Radiology/Studies:  Ct Abdomen Pelvis W Contrast  12/20/2014   CLINICAL DATA:  Severe central chest and upper abdominal pain for 2 days.  EXAM: CT ANGIOGRAPHY CHEST  CT ABDOMEN AND PELVIS WITH CONTRAST  TECHNIQUE: Multidetector CT imaging of the chest was performed using the standard protocol during bolus administration of intravenous contrast. Multiplanar CT image reconstructions and MIPs were obtained to evaluate the vascular anatomy. Multidetector CT imaging of the abdomen and pelvis was performed using the standard protocol during bolus administration of intravenous contrast.  CONTRAST:  OMNIPAQUE IOHEXOL 350 MG/ML SOLN  COMPARISON:  CT abdomen/ pelvis 07/04/2014  FINDINGS: CTA CHEST FINDINGS  Normal caliber thoracic aorta without aneurysm, dissection, or acute aortic syndrome. There is a conventional branching pattern from the aortic arch. No significant atherosclerotic disease. There are no filling defects in the central pulmonary arteries to suggest pulmonary embolus. The heart size is normal. There is no pleural or pericardial effusion. No mediastinal or hilar adenopathy.  Mild dependent linear densities in the right and left lower lobe consistent with atelectasis. The lungs are otherwise clear. No consolidation to suggest pneumonia. No findings of pulmonary edema. No pulmonary nodule or mass. There are no acute or suspicious osseous abnormalities.  CT ABDOMEN and PELVIS FINDINGS  The liver, spleen, pancreas, and adrenal glands are normal. The gallbladder is not seen, surgically absent. Minimal prominence of the common bile duct is a common finding postcholecystectomy. The kidneys demonstrate symmetric enhancement without hydronephrosis or localizing abnormality.  Stomach is physiologically distended. There are no dilated or thickened bowel  loops. The appendix is normal. Small volume of stool throughout the colon, no colonic abnormality. No free air, free fluid, or intra-abdominal fluid collection.  No retroperitoneal adenopathy. Abdominal aorta is normal in caliber.  Within the pelvis the bladder is physiologically distended. Prostate gland is normal. No pelvic free fluid or adenopathy.  There are no acute or suspicious osseous abnormalities. Lucency within L5 vertebra extending to the superior endplate is unchanged dating back to 2013 and considered benign. Scattered bone islands are seen.  Review of the MIP images confirms the above findings.  IMPRESSION: 1. Mild dependent atelectasis in the right and left lower lobe. Otherwise normal CT of the chest. Normal thoracic aorta. 2. No acute abnormality in the abdomen/pelvis.  Postcholecystectomy.   Electronically Signed   By: Rubye OaksMelanie  Ehinger M.D.   On: 12/20/2014 02:53   Ct Angio Chest Aorta W/cm &/or Wo/cm  12/20/2014   CLINICAL DATA:  Severe central chest and upper abdominal pain for 2 days.  EXAM: CT ANGIOGRAPHY CHEST  CT ABDOMEN AND PELVIS WITH CONTRAST  TECHNIQUE: Multidetector CT imaging of the chest was performed using the standard protocol during bolus administration of intravenous contrast. Multiplanar CT image reconstructions and MIPs were obtained to evaluate the vascular anatomy. Multidetector CT imaging of the abdomen and pelvis was performed using the standard protocol during bolus administration of intravenous contrast.  CONTRAST:  100mL OMNIPAQUE IOHEXOL 350 MG/ML SOLN  COMPARISON:  CT abdomen/ pelvis 07/04/2014  FINDINGS: CTA CHEST FINDINGS  Normal caliber thoracic aorta without aneurysm, dissection, or acute aortic syndrome. There is a conventional branching pattern from the aortic arch. No significant atherosclerotic disease. There are no filling defects in the central pulmonary arteries to suggest pulmonary embolus. The heart size is normal. There is no pleural or pericardial  effusion. No mediastinal or hilar adenopathy.  Mild dependent linear densities in the right and left lower lobe consistent with atelectasis. The lungs are otherwise clear. No consolidation to suggest pneumonia. No findings of pulmonary edema. No pulmonary nodule or mass. There are no acute or suspicious osseous abnormalities.  CT ABDOMEN and PELVIS FINDINGS  The liver, spleen, pancreas, and adrenal glands are normal. The gallbladder is not seen, surgically absent. Minimal prominence of the common bile duct is a common finding postcholecystectomy. The kidneys demonstrate symmetric enhancement without hydronephrosis or localizing abnormality.  Stomach is physiologically distended. There are no dilated or thickened bowel loops. The appendix is normal. Small volume of stool throughout the colon, no colonic abnormality. No free air, free fluid, or  intra-abdominal fluid collection.  No retroperitoneal adenopathy. Abdominal aorta is normal in caliber.  Within the pelvis the bladder is physiologically distended. Prostate gland is normal. No pelvic free fluid or adenopathy.  There are no acute or suspicious osseous abnormalities. Lucency within L5 vertebra extending to the superior endplate is unchanged dating back to 2013 and considered benign. Scattered bone islands are seen.  Review of the MIP images confirms the above findings.  IMPRESSION: 1. Mild dependent atelectasis in the right and left lower lobe. Otherwise normal CT of the chest. Normal thoracic aorta. 2. No acute abnormality in the abdomen/pelvis.  Postcholecystectomy.   Electronically Signed   By: Rubye Oaks M.D.   On: 12/20/2014 02:53    EKG: a-fib with RVR, 184 bpm, nonspecific st/t changes  Weights: Filed Weights   12/19/14 2226 12/20/14 0500  Weight: 220 lb (99.791 kg) 217 lb 4.8 oz (98.567 kg)     Physical Exam: Blood pressure 127/90, pulse 100, temperature 97.5 F (36.4 C), temperature source Oral, resp. rate 16, height 6' (1.829 m),  weight 217 lb 4.8 oz (98.567 kg), SpO2 100 %. Body mass index is 29.46 kg/(m^2). General: Well developed, well nourished, in no acute distress. Head: Normocephalic, atraumatic, sclera non-icteric, no xanthomas, nares are without discharge.  Neck: Negative for carotid bruits. JVD not elevated. Lungs: Clear bilaterally to auscultation without wheezes, rales, or rhonchi. Breathing is unlabored. Heart: RRR with S1 S2. No murmurs, rubs, or gallops appreciated. Abdomen: Soft, non-distended with normoactive bowel sounds. No hepatomegaly. No rebound/guarding. No obvious abdominal masses. TTP epigastrum.  Msk:  Strength and tone appear normal for age. Extremities: No clubbing or cyanosis. No edema.  Distal pedal pulses are 2+ and equal bilaterally. Multiple tattoos.  Neuro: Alert and oriented X 3. No facial asymmetry. No focal deficit. Moves all extremities spontaneously. Psych:  Responds to questions appropriately with a normal affect.    Assessment and Plan:  34 y.o. male with h/o HTN and status post cholecystectomy 11/2013 who presented to [facility] on 5/19 with 2-3 day history of epigastric abdominal pain, chest pain, nausea, vomiting, diaphoresis and a 1 day history of palpitations who was found to be in new onset a-fib with RVR with heart rate in the 180s.  1. New onset a-fib with RVR: -Presented with a-fib with heart rate into the 180s. Was given Dilaudid, Ativan, and diltiazem 30 mg prn >110 bpm. -Converted to NSR at 10:20 AM on 5/19 with heart rate in the 70s -Schedule for Lexiscan this afternoon to rule out ischemia as a potential etiology for his new onset a-fib, especially given the family history of his brother having PCI/stenting at age 32 -Echo is pending to evaluate LV function and wall motion  -TSH normal -Presented hypokalemic with K+ of 3.5 -Check magnesium  -CHADSVASc at least 1, (HTN) giving the patient an estimated annual yearly risk of stroke at 1.3% -Hold long term full dose  anticoagulation at this time given CHADSVASc of 1  2. Abdominal pain/chest pain: -Lexiscan Myoview this afternoon -Echo as above -Troponin negative x 3 -Possible surgical adhesions   3. History of drug abuse: -He admits to [facility] abuse, denies any other drug abuse -Cessation is advised  4. HTN: -Controlled -If has normal LV function on echo would discharge on Cardizem CD 120 mg daily for rate control of #1 and BP control of #4  5. Leukocytosis: -Likely inflammatory/dehydration given hgb and SCr -Resolved  6. Acute renal injury: -Resolved   Signed, Eula Listen, PA-C Pager: [phone] 12/20/2014, 10:24 AM   Attending Note Patient seen and examined, agree with detailed note above,  Patient presentation and plan discussed on rounds.   Patient reports severe nausea, vomiting and diarrhea over the past 2 days, severe yesterday Atrial fibrillation likely secondary to underlying GI pathology Converted to normal sinus rhythm with medical management  Pharmacologic Myoview this afternoon He developed severe left abdominal pain, severe, similar to his previous symptoms Nurses gave him Dilaudid for comfort Pictures are still pending  This is his third episode of abdominal pain, 2 prior episodes. He reports symptoms have started following laparoscopic cholecystectomy April 2015 He does not have outpatient GI physician for follow-up He does have a 89-year-old child. Unable to exclude viral gastroenteritis Symptoms seem to have been self-limiting in the past  In summary, likely has gastroenteritis, though unable to exclude other GI etiology This  triggered atrial fibrillation, now normal sinus rhythm with medical management Stress test ordered given family history of sudden death, brother who was 14 years old Now with recurrent pain after his stress test requiring pain medication. We will repeat stress test images when these become available. No further cardiac testing  needed Outpatient follow-up can be scheduled at the time of discharge --- He may benefit from outpatient GI consultation for severe abdominal pain. May need colonoscopy  Signed: Dossie Arbour  M.D., Ph.D. 12/20/14 3:32 pm

## 2014-12-20 NOTE — H&P (Signed)
[facility] - [place] at [facility]   PATIENT NAME: Brandon Allen    MR#:  [id number]  DATE OF BIRTH:  [birth year]   DATE OF ADMISSION:  12/19/2014  PRIMARY CARE PHYSICIAN: PROVIDER NOT IN SYSTEM   REQUESTING/REFERRING PHYSICIAN: Manson Passey  CHIEF COMPLAINT:   Chief Complaint  Patient presents with  . Abdominal Pain    HISTORY OF PRESENT ILLNESS:  Brandon Allen  is a 34 y.o. male with a known history of essential hypertension who is presenting with nausea and vomiting. He describes 3-4 day duration of abdominal pain, epigastric in location, described only as "pain" intensity ranging from 7-8 out of 10, and no worsening or relieving factors. He describes having associated nausea vomiting for 1 day duration. With today's events he also noted chest tightness without frank chest pain. On arrival to the emergency department he was noted to be in rapid atrial fibrillation heart rate 180s. Since arrival his heart rate has improved as well as his symptoms.  PAST MEDICAL HISTORY:   Past Medical History  Diagnosis Date  . HTN (hypertension) 12/20/2014    PAST SURGICAL HISTORY:   Past Surgical History  Procedure Laterality Date  . Cholecystectomy      SOCIAL HISTORY:   History  Substance Use Topics  . Smoking status: Current Every Day Smoker -- 0.50 packs/day    Types: Cigarettes  . Smokeless tobacco: Not on file  . Alcohol Use: No    FAMILY HISTORY:   Family History  Problem Relation Age of Onset  . Heart failure Other     DRUG ALLERGIES:   Allergies  Allergen Reactions  . Morphine And Related Nausea And Vomiting    REVIEW OF SYSTEMS:  REVIEW OF SYSTEMS:  CONSTITUTIONAL: Denies fevers, chills, fatigue, weakness.  EYES: Denies blurred vision, double vision, or eye pain.  EARS, NOSE, THROAT: Denies tinnitus, ear pain, hearing loss.  RESPIRATORY: denies cough, shortness of breath, wheezing  CARDIOVASCULAR: Denies chest pain, positive for  palpitations, denies edema.  GASTROINTESTINAL: Positive nausea, vomiting, abdominal pain.  GENITOURINARY: Denies dysuria, hematuria.  ENDOCRINE: Denies nocturia or thyroid problems. HEMATOLOGIC AND LYMPHATIC: Denies easy bruising or bleeding.  SKIN: Denies rash or lesions.  MUSCULOSKELETAL: Denies pain in neck, back, shoulder, knees, hips, or further arthritic symptoms.  NEUROLOGIC: Denies paralysis, paresthesias.  PSYCHIATRIC: Denies anxiety or depressive symptoms. Otherwise full review of systems performed by me is negative.   MEDICATIONS AT HOME:   Prior to Admission medications   Medication Sig Start Date End Date Taking? Authorizing Provider  acetaminophen (TYLENOL) 500 MG tablet Take 500 mg by mouth every 6 (six) hours as needed.    Historical Provider, MD  diltiazem (CARDIZEM) 30 MG tablet Take 1 tablet (30 mg total) by mouth as needed (Count your heart rate for a full minute.  Take one pill as needed for heart rate greater than 110). 07/05/14   Harle Battiest, NP      VITAL SIGNS:  Blood pressure 124/82, pulse 76, temperature 97.7 F (36.5 C), temperature source Oral, resp. rate 16, height 6' (1.829 m), weight 220 lb (99.791 kg), SpO2 99 %.  PHYSICAL EXAMINATION:  VITAL SIGNS: Filed Vitals:   12/20/14 0329  BP: 124/82  Pulse:   Temp:   Resp: 16   GENERAL:34 y.o.male currently in no acute distress.  HEAD: Normocephalic, atraumatic.  EYES: Pupils equal, round, reactive to light. Extraocular muscles intact. No scleral icterus.  MOUTH: Moist mucosal membrane. Dentition intact. No abscess noted.  EAR, NOSE, THROAT: Clear without exudates. No external lesions.  NECK: Supple. No thyromegaly. No nodules. No JVD.  PULMONARY: Clear to ascultation, without wheeze rails or rhonci. No use of accessory muscles, Good respiratory effort. good air entry bilaterally CHEST: Nontender to palpation.  CARDIOVASCULAR: S1 and S2. Irregular rate and rhythm. No murmurs, rubs, or gallops.  No edema. Pedal pulses 2+ bilaterally.  GASTROINTESTINAL: Soft, nontender, nondistended. No masses. Positive bowel sounds. No hepatosplenomegaly.  MUSCULOSKELETAL: No swelling, clubbing, or edema. Range of motion full in all extremities.  NEUROLOGIC: Cranial nerves II through XII are intact. No gross focal neurological deficits. Sensation intact. Reflexes intact.  SKIN: No ulceration, lesions, rashes, or cyanosis. Skin warm and dry. Turgor intact.  PSYCHIATRIC: Mood, affect within normal limits. The patient is awake, alert and oriented x 3. Insight, judgment intact.    LABORATORY PANEL:   CBC  Recent Labs Lab 12/19/14 2245  WBC 21.0*  HGB 17.4  HCT 52.3*  PLT 251   ------------------------------------------------------------------------------------------------------------------  Chemistries   Recent Labs Lab 12/19/14 2245  NA 148*  K 3.5  CL 109  CO2 22  GLUCOSE 102*  BUN 13  CREATININE 1.26*  CALCIUM 10.8*  AST 30  ALT 18  ALKPHOS 68  BILITOT 0.7   ------------------------------------------------------------------------------------------------------------------  Cardiac Enzymes  Recent Labs Lab 12/19/14 2245  TROPONINI <0.03   ------------------------------------------------------------------------------------------------------------------  RADIOLOGY:  Ct Abdomen Pelvis W Contrast  12/20/2014   CLINICAL DATA:  Severe central chest and upper abdominal pain for 2 days.  EXAM: CT ANGIOGRAPHY CHEST  CT ABDOMEN AND PELVIS WITH CONTRAST  TECHNIQUE: Multidetector CT imaging of the chest was performed using the standard protocol during bolus administration of intravenous contrast. Multiplanar CT image reconstructions and MIPs were obtained to evaluate the vascular anatomy. Multidetector CT imaging of the abdomen and pelvis was performed using the standard protocol during bolus administration of intravenous contrast.  CONTRAST:  OMNIPAQUE IOHEXOL 350 MG/ML SOLN   COMPARISON:  CT abdomen/ pelvis 07/04/2014  FINDINGS: CTA CHEST FINDINGS  Normal caliber thoracic aorta without aneurysm, dissection, or acute aortic syndrome. There is a conventional branching pattern from the aortic arch. No significant atherosclerotic disease. There are no filling defects in the central pulmonary arteries to suggest pulmonary embolus. The heart size is normal. There is no pleural or pericardial effusion. No mediastinal or hilar adenopathy.  Mild dependent linear densities in the right and left lower lobe consistent with atelectasis. The lungs are otherwise clear. No consolidation to suggest pneumonia. No findings of pulmonary edema. No pulmonary nodule or mass. There are no acute or suspicious osseous abnormalities.  CT ABDOMEN and PELVIS FINDINGS  The liver, spleen, pancreas, and adrenal glands are normal. The gallbladder is not seen, surgically absent. Minimal prominence of the common bile duct is a common finding postcholecystectomy. The kidneys demonstrate symmetric enhancement without hydronephrosis or localizing abnormality.  Stomach is physiologically distended. There are no dilated or thickened bowel loops. The appendix is normal. Small volume of stool throughout the colon, no colonic abnormality. No free air, free fluid, or intra-abdominal fluid collection.  No retroperitoneal adenopathy. Abdominal aorta is normal in caliber.  Within the pelvis the bladder is physiologically distended. Prostate gland is normal. No pelvic free fluid or adenopathy.  There are no acute or suspicious osseous abnormalities. Lucency within L5 vertebra extending to the superior endplate is unchanged dating back to 2013 and considered benign. Scattered bone islands are seen.  Review of the MIP images confirms the above findings.  IMPRESSION:  1. Mild dependent atelectasis in the right and left lower lobe. Otherwise normal CT of the chest. Normal thoracic aorta. 2. No acute abnormality in the abdomen/pelvis.   Postcholecystectomy.   Electronically Signed   By: Rubye OaksMelanie  Ehinger M.D.   On: 12/20/2014 02:53   Ct Angio Chest Aorta W/cm &/or Wo/cm  12/20/2014   CLINICAL DATA:  Severe central chest and upper abdominal pain for 2 days.  EXAM: CT ANGIOGRAPHY CHEST  CT ABDOMEN AND PELVIS WITH CONTRAST  TECHNIQUE: Multidetector CT imaging of the chest was performed using the standard protocol during bolus administration of intravenous contrast. Multiplanar CT image reconstructions and MIPs were obtained to evaluate the vascular anatomy. Multidetector CT imaging of the abdomen and pelvis was performed using the standard protocol during bolus administration of intravenous contrast.  CONTRAST:  100mL OMNIPAQUE IOHEXOL 350 MG/ML SOLN  COMPARISON:  CT abdomen/ pelvis 07/04/2014  FINDINGS: CTA CHEST FINDINGS  Normal caliber thoracic aorta without aneurysm, dissection, or acute aortic syndrome. There is a conventional branching pattern from the aortic arch. No significant atherosclerotic disease. There are no filling defects in the central pulmonary arteries to suggest pulmonary embolus. The heart size is normal. There is no pleural or pericardial effusion. No mediastinal or hilar adenopathy.  Mild dependent linear densities in the right and left lower lobe consistent with atelectasis. The lungs are otherwise clear. No consolidation to suggest pneumonia. No findings of pulmonary edema. No pulmonary nodule or mass. There are no acute or suspicious osseous abnormalities.  CT ABDOMEN and PELVIS FINDINGS  The liver, spleen, pancreas, and adrenal glands are normal. The gallbladder is not seen, surgically absent. Minimal prominence of the common bile duct is a common finding postcholecystectomy. The kidneys demonstrate symmetric enhancement without hydronephrosis or localizing abnormality.  Stomach is physiologically distended. There are no dilated or thickened bowel loops. The appendix is normal. Small volume of stool throughout the colon,  no colonic abnormality. No free air, free fluid, or intra-abdominal fluid collection.  No retroperitoneal adenopathy. Abdominal aorta is normal in caliber.  Within the pelvis the bladder is physiologically distended. Prostate gland is normal. No pelvic free fluid or adenopathy.  There are no acute or suspicious osseous abnormalities. Lucency within L5 vertebra extending to the superior endplate is unchanged dating back to 2013 and considered benign. Scattered bone islands are seen.  Review of the MIP images confirms the above findings.  IMPRESSION: 1. Mild dependent atelectasis in the right and left lower lobe. Otherwise normal CT of the chest. Normal thoracic aorta. 2. No acute abnormality in the abdomen/pelvis.  Postcholecystectomy.   Electronically Signed   By: Rubye OaksMelanie  Ehinger M.D.   On: 12/20/2014 02:53    EKG:   Orders placed or performed during the hospital encounter of 12/19/14  . ED EKG  . ED EKG    IMPRESSION AND PLAN:   34 year old African gentleman history of essential hypertension who originally presented for nausea, vomiting, chest tightness found to be in rapid atrial fibrillation.  1. New onset A. fib with rapid ventricular response: Goal heart rate less than 120, we'll provide IV Cardizem as required, place on telemetry, trend cardiac enzymes, check echocardiogram, consult cardiology. His [place]HAD score: 1, we will defer anticoagulation at this time 2. Abdominal pain: I question if this is related solely to his A. fib, as he has no acute findings on CT 3. Essential hypertension: Continue home dosage Cardizem 4.Venous thromboembolism prophylactic: Heparin subcutaneous     All the records are reviewed and case  discussed with ED provider. Management plans discussed with the patient, family and they are in agreement.  CODE STATUS: Full  TOTAL TIME TAKING CARE OF THIS PATIENT: 35 minutes.    Desirey Keahey,  Mardi MainlandDavid K M.D on 12/20/2014 at 3:49 AM  Between 7am to 6pm - Pager -  [phone]  After 6pm: House Pager: - [phone]  [facility]  Office  [phone]  CC: Primary care physician; PROVIDER NOT IN SYSTEM

## 2014-12-20 NOTE — Plan of Care (Signed)
Problem: Discharge Progression Outcomes Goal: Other Discharge Outcomes/Goals Outcome: Progressing Patient was admitted from ER via a stretcher following admission d/t c/o abdominal and epigastric pain and nausea and vomiting with onset of a new afib and RVR. Patient denied pain or N&V on admission to the unit. He was oriented to the unit and educated about fall prevention. He is currently in asymptomatic afib in the low 100, otherwise he is not complaining or appear to be in any distress.

## 2014-12-20 NOTE — ED Provider Notes (Signed)
[facility] Emergency Department Provider Note  ____________________________________________  Time seen: 10:35 PM  I have reviewed the triage vital signs and the nursing notes.   HISTORY  Chief Complaint Abdominal Pain    HPI Lelon HuhKevin Houp is a 34 y.o. male who complains of severe central chest and upper abdomen pain for the last 2 days. He reports that the pain has been constant but waxing and waning in nature. He is unable to describe anything that makes it better or worse. It is currently severe, 10 out of 10, and the patient reports diaphoresis and vomiting. He states that it feels like gallstones. He had in the past, but he had a cholecystectomy one year ago. Pain is sharp, nonradiating other than between the chest and abdomen, no aggravating or alleviating factors. No recent trauma. Denies smoking, alcohol or drug use.     History reviewed. No pertinent past medical history. Irregular heartbeat There are no active problems to display for this patient.   Past Surgical History  Procedure Laterality Date  . Cholecystectomy      Current Outpatient Rx  Name  Route  Sig  Dispense  Refill  . acetaminophen (TYLENOL) 500 MG tablet   Oral   Take 500 mg by mouth every 6 (six) hours as needed.         . diltiazem (CARDIZEM) 30 MG tablet   Oral   Take 1 tablet (30 mg total) by mouth as needed (Count your heart rate for a full minute.  Take one pill as needed for heart rate greater than 110).   20 tablet   0     Allergies Morphine and related  No family history on file.  Social History History  Substance Use Topics  . Smoking status: Current Every Day Smoker -- 0.50 packs/day    Types: Cigarettes  . Smokeless tobacco: Not on file  . Alcohol Use: No    Review of Systems  Constitutional: No fever or chills. No weight changes Eyes:No blurry vision or double vision.  ENT: No sore throat. Cardiovascular: Chest pain as above Respiratory: No  dyspnea or cough. Gastrointestinal: Abdominal pain as above and vomiting.  No BRBPR or melena. Genitourinary: Negative for dysuria, urinary retention, bloody urine, or difficulty urinating. Musculoskeletal: Negative for back pain. No joint swelling or pain. Skin: Negative for rash. Neurological: Negative for headaches, focal weakness or numbness. Psychiatric:Anxiety Endocrine:No hot/cold intolerance, changes in energy, or sleep difficulty.  10-point ROS otherwise negative.  ____________________________________________   PHYSICAL EXAM:  VITAL SIGNS: ED Triage Vitals  Enc Vitals Group     BP 12/19/14 2226 104/70 mmHg     Pulse Rate 12/19/14 2226 39     Resp 12/19/14 2226 30     Temp 12/19/14 2226 97.7 F (36.5 C)     Temp Source 12/19/14 2226 Oral     SpO2 12/19/14 2226 100 %     Weight 12/19/14 2226 220 lb (99.791 kg)     Height 12/19/14 2226 6' (1.829 m)     Head Cir --      Peak Flow --      Pain Score 12/19/14 2245 9     Pain Loc --      Pain Edu? --      Excl. in GC? --      Constitutional: Alert and oriented. Severe distress, diaphoretic. Eyes: No scleral icterus. No conjunctival pallor. PERRL. EOMI ENT   Head: Normocephalic and atraumatic.   Nose: No congestion/rhinnorhea.  No septal hematoma   Mouth/Throat: MMM, no pharyngeal erythema. No peritonsillar mass. No uvula shift.   Neck: No stridor. No SubQ emphysema. No meningismus. Hematological/Lymphatic/Immunilogical: No cervical lymphadenopathy. Cardiovascular: Irregularly irregular rhythm with a rate ranging from 140-160.Marland Kitchen. Normal and symmetric distal pulses are present in all extremities. No murmurs, rubs, or gallops. Chest wall nontender Respiratory: Normal respiratory effort without tachypnea nor retractions. Breath sounds are clear and equal bilaterally. No wheezes/rales/rhonchi. Gastrointestinal: Diffuse abdominal tenderness with guarding. No distention. No abdominal pain with heel  tap Genitourinary: deferred Musculoskeletal: Nontender with normal range of motion in all extremities. No joint effusions.  No lower extremity tenderness.  No edema. Neurologic:   Normal speech and language.  CN 2-10 normal. Motor grossly intact. No gross focal neurologic deficits are appreciated.  Skin:  Skin is warm, dry and intact. No rash noted.  No petechiae, purpura, or bullae. Psychiatric: Anxious, hypervigilant ____________________________________________    LABS (pertinent positives/negatives) (all labs ordered are listed, but only abnormal results are displayed) Labs Reviewed  LACTIC ACID, PLASMA - Abnormal; Notable for the following:    Lactic Acid, Venous 2.3 (*)    All other components within normal limits  BASIC METABOLIC PANEL  HEPATIC FUNCTION PANEL  LIPASE, BLOOD  TROPONIN I  LACTIC ACID, PLASMA  CBC WITH DIFFERENTIAL/PLATELET  URINALYSIS COMPLETEWITH MICROSCOPIC ([facility])   URINE DRUG SCREEN, QUALITATIVE ([facility])   ____________________________________________   EKG  EKG at 10:34 PM reveals A. fib with RVR, rate 184, normal axis, normal ST and T segments.  EKG at 11:16 PM reveals atrial fibrillation with a rate of 136, normal axis intervals, QRS ST and T  ____________________________________________    RADIOLOGY  CT chest, abdomen, pelvis pending  ____________________________________________   PROCEDURES CRITICAL CARE Performed by: Scotty CourtSTAFFORD, Shadow Schedler   Total critical care time: 35 minutes  Critical care time was exclusive of separately billable procedures and treating other patients.  Critical care was necessary to treat or prevent imminent or life-threatening deterioration.  Critical care was time spent personally by me on the following activities: development of treatment plan with patient and/or surrogate as well as nursing, discussions with consultants, evaluation of patient's response to treatment, examination of patient, obtaining history  from patient or surrogate, ordering and performing treatments and interventions, ordering and review of laboratory studies, ordering and review of radiographic studies, pulse oximetry and re-evaluation of patient's condition.  ____________________________________________   INITIAL IMPRESSION / ASSESSMENT AND PLAN / ED COURSE  Pertinent labs & imaging results that were available during my care of the patient were reviewed by me and considered in my medical decision making (see chart for details).  Patient presented that presents in severe distress with unclear clinical picture. Denies any drug use or alcohol dependence. This may be exacerbation of underlying atrial fibrillation with severe anxiety. However, due to the abdominal guarding, we'll proceed with CT scan to evaluate for a surgical abdomen or other abdominal catastrophe. We'll get a CT angiogram of the chest to evaluate for aortic dissection.We'll also get a CT abdomen and pelvis with IV contrast for possible pancreatitis or abscess. Dilaudid and Ativan IV for pain and anxiety. 2 L IV fluids.  ----------------------------------------- 12:28 AM on 12/20/2014 -----------------------------------------  On reassessment, the heart rate is improved and the patient is calm and more comfortable. He does report severe pain still. On reassessment of the abdomen, the abdominal tenderness is localizable to the epigastrium, left upper quadrant and left lower quadrant. It is severe with guarding in those areas. With  fluids. Heart rate has decreased to between 110 and 130. Care of the patient is signed out to Dr. Manson Passey following up on labs and CT for further disposition    ____________________________________________   FINAL CLINICAL IMPRESSION(S) / ED DIAGNOSES  Final diagnoses:  Chest pain at rest   atrial fibrillation with rapid ventricular response Epigastric abdominal pain, acute    Sharman Cheek, MD 12/20/14 [phone]

## 2014-12-20 NOTE — ED Notes (Signed)
Pt to Ct via stretcher

## 2014-12-20 NOTE — Progress Notes (Signed)
Pt again vomited in stress test.  Cardiac work ups negative.  Likely GI issue- gastric or PUD.   Hold d/c as pt vomited again.  GI consult  PPI and symptomatic management.

## 2014-12-20 NOTE — Consult Note (Signed)
Gastroenterology Consultation  Referring Provider:     Dr Madelon LipsVacchani Primary Care Physician:  NO PCP Primary Gastroenterologist:  Dr. Servando SnareWohl        Reason for Consultation:     Abdominal pain  Date of Admission:  12/19/2014 Date of Consultation:  12/20/2014         HPI:   Brandon Allen is a 34 y.o. male admitted with severe abdominal pain, chest pain, nausea, diaphoresis & vomiting.  He had palpitations and was found to have atrial fibrillation with RVR and heart rate in the 180s. He has been seen by cardiology given his new onset atrial fibrillation   He has abdominal bloating & discomfort that started 3 days ago.  He did do a workout yesterday at home.  His vomiting started yesterday with severe diaphoresis after visitng his grandma in the hospital with CHF.  No ill contacts.  Denies fever or chills.  Vomiting has been constant yesterday but ceased this afternoon.  Pain is constant 5/10 now in LUQ & left side.  It was 10/10 when he was admitted.  Pain is decsribed as a stabbing pain.  No heartburn or indigestion. Denies dysphagia or odynophagia.  He has been having a BM every AM without rectal bleeding or melena.  CT abdomen & pelvis with IV and oral contrast was benign.  He tells me he had an EGD years ago that was normal. 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 He did take an old pain pill he had at home prior to his admission but he denies taking any pain medications on a regular basis.   Past Medical History  Diagnosis Date  . HTN (hypertension) 12/20/2014  . Kidney stones   . Deafness in right ear   . Atrial fibrillation     Dr Mariah MillingGollan  . NSAID induced gastritis   . 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 Cholecystectomy    . Esophagogastroduodenoscopy    . Inner ear surgery    . Esophagogastroduodenoscopy  11/2011    NSAID-induced  gastritis (Dr Bluford Kaufmannh)    Prior to Admission medications   Medication Sig Start Date End Date Taking? Authorizing Provider  acetaminophen (TYLENOL) 500 MG tablet Take 500 mg by mouth every 6 (six) hours as needed.    Historical Provider, MD  diltiazem (CARDIZEM) 30 MG tablet Take 1 tablet (30 mg total) by mouth as needed (Count your heart rate for a full minute.  Take one pill as needed for heart rate greater than 110). 07/05/14   Harle BattiestElizabeth Tysinger, NP    Family History  Problem Relation Age of Onset  . Heart failure Other   . Colon cancer Maternal Grandfather 50     History  Substance Use Topics  . Smoking status: Current Every Day Smoker -- 0.50 packs/day    Types: Cigarettes    Start date: 12/19/2009  . Smokeless tobacco: Not on file  . Alcohol Use: No    Allergies as of 12/19/2014 - Review Complete 07/04/2014  Allergen Reaction Noted  . Morphine and related Nausea And Vomiting 12/19/2014    Review of Systems:    All systems reviewed and negative except where noted in HPI.   Physical Exam:  Vital signs in last 24 hours: Temp:  [97.5 F (36.4 C)-98.1 F (36.7 C)] 98.1 F (36.7 C) (  05/19 1117) Pulse Rate:  [39-135] 81 (05/19 1117) Resp:  [16-30] 18 (05/19 1117) BP: (104-157)/(70-141) 135/85 mmHg (05/19 1117) SpO2:  [98 %-100 %] 99 % (05/19 1117) Weight:  [98.567 kg (217 lb 4.8 oz)-99.791 kg (220 lb)] 98.567 kg (217 lb 4.8 oz) (05/19 0500) Last BM Date: 12/19/14 General:   Alert,  Well-developed, well-nourished, pleasant and cooperative in NAD, accompanied by his mother, children, girlfriend and friend Head:  Normocephalic and atraumatic. Eyes:  Sclera clear, no icterus.   Conjunctiva pink. Ears:  Normal auditory acuity. Nose:  No deformity, discharge, or lesions. Mouth:  No deformity or lesions,oropharynx pink & moist. Neck:  Supple; no masses or thyromegaly. Lungs:  Respirations even and unlabored.  Clear throughout to auscultation.   No wheezes, crackles, or rhonchi.  No acute distress. Heart:  Regular rate and rhythm; no murmurs, clicks, rubs, or gallops. Abdomen:  Normal bowel sounds.  No bruits.  Soft, non-distended without masses, hepatosplenomegaly or hernias noted.  No guarding or rebound tenderness.  +Positive Carnett sign with mild pain elicited with palpation LUQ Rectal:  Deferred.  Msk:  Symmetrical without gross deformities.  Good, equal movement & strength bilaterally. Pulses:  Normal pulses noted. Extremities:  No clubbing or edema.  No cyanosis. Neurologic:  Alert and oriented x3;  grossly normal neurologically. Skin:  Intact without significant lesions or rashes.  No jaundice. Lymph Nodes:  No significant cervical adenopathy. Psych:  Alert and cooperative. Normal mood and affect.  LAB RESULTS:  Recent Labs  12/19/14 2245 12/20/14 0434  WBC 21.0* 10.4  HGB 17.4 14.4  HCT 52.3* 41.7  PLT 251 169   BMET  Recent Labs  12/19/14 2245 12/20/14 0434  NA 148*  --   K 3.5  --   CL 109  --   CO2 22  --   GLUCOSE 102*  --   BUN 13  --   CREATININE 1.26* 0.98  CALCIUM 10.8*  --    LFT  Recent Labs  12/19/14 2245  PROT 9.5*  ALBUMIN 5.6*  AST 30  ALT 18  ALKPHOS 68  BILITOT 0.7  BILIDIR <0.1*  IBILI NOT CALCULATED   PT/INR No results for input(s): LABPROT, INR in the last 72 hours.  STUDIES: Ct Abdomen Pelvis W Contrast  12/20/2014   CLINICAL DATA:  Severe central chest and upper abdominal pain for 2 days.  EXAM: CT ANGIOGRAPHY CHEST  CT ABDOMEN AND PELVIS WITH CONTRAST  TECHNIQUE: Multidetector CT imaging of the chest was performed using the standard protocol during bolus administration of intravenous contrast. Multiplanar CT image reconstructions and MIPs were obtained to evaluate the vascular anatomy. Multidetector CT imaging of the abdomen and pelvis was performed using the standard protocol during bolus administration of intravenous contrast.  CONTRAST:  OMNIPAQUE IOHEXOL 350 MG/ML SOLN  COMPARISON:  CT abdomen/  pelvis 07/04/2014  FINDINGS: CTA CHEST FINDINGS  Normal caliber thoracic aorta without aneurysm, dissection, or acute aortic syndrome. There is a conventional branching pattern from the aortic arch. No significant atherosclerotic disease. There are no filling defects in the central pulmonary arteries to suggest pulmonary embolus. The heart size is normal. There is no pleural or pericardial effusion. No mediastinal or hilar adenopathy.  Mild dependent linear densities in the right and left lower lobe consistent with atelectasis. The lungs are otherwise clear. No consolidation to suggest pneumonia. No findings of pulmonary edema. No pulmonary nodule or mass. There are no acute or suspicious osseous abnormalities.  CT ABDOMEN and PELVIS  FINDINGS  The liver, spleen, pancreas, and adrenal glands are normal. The gallbladder is not seen, surgically absent. Minimal prominence of the common bile duct is a common finding postcholecystectomy. The kidneys demonstrate symmetric enhancement without hydronephrosis or localizing abnormality.  Stomach is physiologically distended. There are no dilated or thickened bowel loops. The appendix is normal. Small volume of stool throughout the colon, no colonic abnormality. No free air, free fluid, or intra-abdominal fluid collection.  No retroperitoneal adenopathy. Abdominal aorta is normal in caliber.  Within the pelvis the bladder is physiologically distended. Prostate gland is normal. No pelvic free fluid or adenopathy.  There are no acute or suspicious osseous abnormalities. Lucency within L5 vertebra extending to the superior endplate is unchanged dating back to 2013 and considered benign. Scattered bone islands are seen.  Review of the MIP images confirms the above findings.  IMPRESSION: 1. Mild dependent atelectasis in the right and left lower lobe. Otherwise normal CT of the chest. Normal thoracic aorta. 2. No acute abnormality in the abdomen/pelvis.  Postcholecystectomy.    Electronically Signed   By: Rubye OaksMelanie  Ehinger M.D.   On: 12/20/2014 02:53   Nm Myocar Multi W/spect W/wall Motion / Ef  12/20/2014    Pharmacological myocardial perfusion imaging study with no significant  ischemia.  There was no ST segment deviation on EKG noted during stress or in  recovery.  The left ventricular ejection fraction is normal (55-65%). No wall  motion abnormality noted.  GI uptake artifact noted.  This is a low risk study.    Ct Angio Chest Aorta W/cm &/or Wo/cm  12/20/2014   CLINICAL DATA:  Severe central chest and upper abdominal pain for 2 days.  EXAM: CT ANGIOGRAPHY CHEST  CT ABDOMEN AND PELVIS WITH CONTRAST  TECHNIQUE: Multidetector CT imaging of the chest was performed using the standard protocol during bolus administration of intravenous contrast. Multiplanar CT image reconstructions and MIPs were obtained to evaluate the vascular anatomy. Multidetector CT imaging of the abdomen and pelvis was performed using the standard protocol during bolus administration of intravenous contrast.  CONTRAST:  100mL OMNIPAQUE IOHEXOL 350 MG/ML SOLN  COMPARISON:  CT abdomen/ pelvis 07/04/2014  FINDINGS: CTA CHEST FINDINGS  Normal caliber thoracic aorta without aneurysm, dissection, or acute aortic syndrome. There is a conventional branching pattern from the aortic arch. No significant atherosclerotic disease. There are no filling defects in the central pulmonary arteries to suggest pulmonary embolus. The heart size is normal. There is no pleural or pericardial effusion. No mediastinal or hilar adenopathy.  Mild dependent linear densities in the right and left lower lobe consistent with atelectasis. The lungs are otherwise clear. No consolidation to suggest pneumonia. No findings of pulmonary edema. No pulmonary nodule or mass. There are no acute or suspicious osseous abnormalities.  CT ABDOMEN and PELVIS FINDINGS  The liver, spleen, pancreas, and adrenal glands are normal. The gallbladder is not  seen, surgically absent. Minimal prominence of the common bile duct is a common finding postcholecystectomy. The kidneys demonstrate symmetric enhancement without hydronephrosis or localizing abnormality.  Stomach is physiologically distended. There are no dilated or thickened bowel loops. The appendix is normal. Small volume of stool throughout the colon, no colonic abnormality. No free air, free fluid, or intra-abdominal fluid collection.  No retroperitoneal adenopathy. Abdominal aorta is normal in caliber.  Within the pelvis the bladder is physiologically distended. Prostate gland is normal. No pelvic free fluid or adenopathy.  There are no acute or suspicious osseous abnormalities. Lucency within L5 vertebra  extending to the superior endplate is unchanged dating back to 2013 and considered benign. Scattered bone islands are seen.  Review of the MIP images confirms the above findings.  IMPRESSION: 1. Mild dependent atelectasis in the right and left lower lobe. Otherwise normal CT of the chest. Normal thoracic aorta. 2. No acute abnormality in the abdomen/pelvis.  Postcholecystectomy.   Electronically Signed   By: Rubye Oaks M.D.   On: 12/20/2014 02:53      Impression / Plan:   Brandon Allen is a 34 y.o. y/o male with acute abdominal pain, nausea, vomiting, diaphoresis and leukocytosis. He was diagnosed with atrial fibrillation with RVR and is being followed by cardiology. I suspect he may have had an acute gastroenteritis with some residual abdominal wall tenderness from recurrent vomiting, cyclic hyperemesis syndrome secondary to cannabinoid use, versus gastrointestinal symptoms secondary to atrial fibrillation with RVR.  At this point, pain is minimal and vomiting has resolved. CT scan of abdomen and pelvis with IV and oral contrast was reassuring.  I will discuss his care with Dr Midge Minium.  Plan: 1) continue supportive measures 2) If he has recurrent pain, nausea or vomiting, he will need to  be re-evaluated by gastroenterology and he understands this 3) Protonix  daily 4) Urged marijuana cessation  Thank you for involving me in the care of this patient.    Lorenza Burton, NP  12/20/2014, 5:41 PM [facility]  [address] [place], [place] [address] Phone: [phone] Fax : [phone]

## 2014-12-20 NOTE — ED Provider Notes (Signed)
I assumed care of Dr. Scotty CourtStafford, valuated the patient who continues to have at present 8 out of 10 abdominal pain. CT abdomen and pelvis negative. Leukocytosis WBC 21. Patient initially in atrial fibrillation with rapid ventricular response however at present currently still in atrial fibrillation now rate controlled with a rate of 96 and variable. As such we'll admit the patient to the hospital for leukocytosis of unknown etiology and atrial fibrillation new onset  Darci Currentandolph N Kally Cadden, MD 12/20/14 [phone]

## 2014-12-20 NOTE — Progress Notes (Signed)
Dr. Mariah MillingGollan called RN stating stress test was clean, up to Hospitalist to proceed with discharge or not. Patient complaining of LUQ pain. Per Dr. Elisabeth PigeonVachhani, cancel discharge and order GI consult. Brandon Allen, Halei Hanover G

## 2014-12-21 LAB — CBC WITH DIFFERENTIAL/PLATELET
Basophils Absolute: 0.1 10*3/uL (ref 0–0.1)
Basophils Relative: 1 %
Eosinophils Absolute: 0.1 10*3/uL (ref 0–0.7)
Eosinophils Relative: 2 %
HEMATOCRIT: 42.9 % (ref 40.0–52.0)
Hemoglobin: 14.6 g/dL (ref 13.0–18.0)
LYMPHS ABS: 2.5 10*3/uL (ref 1.0–3.6)
Lymphocytes Relative: 41 %
MCH: 31.6 pg (ref 26.0–34.0)
MCHC: 34.2 g/dL (ref 32.0–36.0)
MCV: 92.6 fL (ref 80.0–100.0)
MONOS PCT: 12 %
Monocytes Absolute: 0.7 10*3/uL (ref 0.2–1.0)
NEUTROS ABS: 2.7 10*3/uL (ref 1.4–6.5)
Neutrophils Relative %: 44 %
Platelets: 159 10*3/uL (ref 150–440)
RBC: 4.63 MIL/uL (ref 4.40–5.90)
RDW: 12.1 % (ref 11.5–14.5)
WBC: 6.1 10*3/uL (ref 3.8–10.6)

## 2014-12-21 LAB — HEPATIC FUNCTION PANEL
ALT: 14 U/L — ABNORMAL LOW (ref 17–63)
AST: 17 U/L (ref 15–41)
Albumin: 4.1 g/dL (ref 3.5–5.0)
Alkaline Phosphatase: 51 U/L (ref 38–126)
BILIRUBIN DIRECT: 0.1 mg/dL (ref 0.1–0.5)
BILIRUBIN TOTAL: 0.8 mg/dL (ref 0.3–1.2)
Indirect Bilirubin: 0.7 mg/dL (ref 0.3–0.9)
Total Protein: 6.9 g/dL (ref 6.5–8.1)

## 2014-12-21 LAB — LIPASE, BLOOD: Lipase: 35 U/L (ref 22–51)

## 2014-12-21 MED ORDER — PANTOPRAZOLE SODIUM 40 MG PO TBEC
40.0000 mg | DELAYED_RELEASE_TABLET | Freq: Two times a day (BID) | ORAL | Status: DC
  Administered 2014-12-21: 40 mg via ORAL
  Filled 2014-12-21: qty 1

## 2014-12-21 MED ORDER — OXYCODONE-ACETAMINOPHEN 5-300 MG PO TABS: 1.0000 | ORAL_TABLET | Freq: Four times a day (QID) | ORAL | Status: DC | PRN

## 2014-12-21 MED ORDER — PANTOPRAZOLE SODIUM 40 MG PO TBEC: 40.0000 mg | DELAYED_RELEASE_TABLET | Freq: Two times a day (BID) | ORAL | Status: DC

## 2014-12-21 MED ORDER — PROMETHAZINE HCL 25 MG PO TABS: 25.0000 mg | ORAL_TABLET | Freq: Four times a day (QID) | ORAL | Status: DC | PRN

## 2014-12-21 MED ORDER — ONDANSETRON 4 MG PO TBDP: 4.0000 mg | ORAL_TABLET | Freq: Three times a day (TID) | ORAL | Status: DC | PRN

## 2014-12-21 NOTE — Plan of Care (Signed)
Problem: Discharge Progression Outcomes Goal: Tolerating diet Outcome: Adequate for Discharge Will continue clear liquid diet at home as tolerated for the next couple days per GI

## 2014-12-21 NOTE — Progress Notes (Signed)
Patient given discharge teaching and paperwork regarding medications/prescriptions, diet (liquids for a few days), follow-up appointments and activity. Patient understanding verbalized. No complaints at this time. IV and telemetry discontinued prior to departure. Skin assessment as previously charted and vitals are stable. Patient being discharged to home. Waiting for ride from family/friend.  Adella NissenBailey, Carrin Vannostrand G

## 2014-12-21 NOTE — Plan of Care (Signed)
Problem: Discharge Progression Outcomes Goal: Discharge plan in place and appropriate Patient converted from afib to NSR and remained NSR overnight. Patient has 1 episode of vomiting. PRN antiemetic was changed from PO to IV. Patient nausea was well controlled after the med adjustment. Pain med was administered for epigastric pain and provided needed pain relieve. Patient remained on RA and hemodynamically stable. Will continue to monitor and report to day shift nurse.

## 2014-12-21 NOTE — Discharge Instructions (Signed)
Liquid diet for 2-3 days. Use pain meds only if needed. Avoid dairy products for next 3-4 days.

## 2014-12-21 NOTE — Progress Notes (Signed)
Patient had another episode of nausea/vomiting and sharp LUQ pain - improved with PRN medications. Spoke with Brandon Allen (GI) earlier this morning, she was aware of vomiting over night; changed diet to clear liquids and plans to round again this afternoon.  Brandon Allen, Brandon Allen

## 2014-12-21 NOTE — Progress Notes (Signed)
[facility] - [place] [facility]   PATIENT NAME: Brandon Allen    MR#:  [id number]  DATE OF BIRTH:  07/24/1981  SUBJECTIVE:  CHIEF COMPLAINT:   Chief Complaint  Patient presents with  . Abdominal Pain    Had one time vomit today- after having milk- feels fine.  REVIEW OF SYSTEMS:  CONSTITUTIONAL: No fever, fatigue or weakness.  EYES: No blurred or double vision.  EARS, NOSE, AND THROAT: No tinnitus or ear pain.  RESPIRATORY: No cough, shortness of breath, wheezing or hemoptysis.  CARDIOVASCULAR: No chest pain, orthopnea, edema.  GASTROINTESTINAL: positive for nausea, vomiting,no diarrhea or abdominal pain.  GENITOURINARY: No dysuria, hematuria.  ENDOCRINE: No polyuria, nocturia,  HEMATOLOGY: No anemia, easy bruising or bleeding SKIN: No rash or lesion. MUSCULOSKELETAL: No joint pain or arthritis.   NEUROLOGIC: No tingling, numbness, weakness.  PSYCHIATRY: No anxiety or depression.   ROS  DRUG ALLERGIES:   Allergies  Allergen Reactions  . Morphine And Related Nausea And Vomiting    VITALS:  Blood pressure 141/96, pulse 67, temperature 97.5 F (36.4 C), temperature source Oral, resp. rate 18, height 6' (1.829 m), weight 99.973 kg (220 lb 6.4 oz), SpO2 100 %.  PHYSICAL EXAMINATION:  GENERAL:  34 y.o.-year-old patient lying in the bed with no acute distress.  EYES: Pupils equal, round, reactive to light and accommodation. No scleral icterus. Extraocular muscles intact.  HEENT: Head atraumatic, normocephalic. Oropharynx and nasopharynx clear.  NECK:  Supple, no jugular venous distention. No thyroid enlargement, no tenderness.  LUNGS: Normal breath sounds bilaterally, no wheezing, rales,rhonchi or crepitation. No use of accessory muscles of respiration.  CARDIOVASCULAR: S1, S2 normal. No murmurs, rubs, or gallops.  ABDOMEN: Soft, mild tender, nondistended. Bowel sounds present. No organomegaly or mass.  EXTREMITIES: No pedal edema, cyanosis, or  clubbing.  NEUROLOGIC: Cranial nerves II through XII are intact. Muscle strength 5/5 in all extremities. Sensation intact. Gait not checked.  PSYCHIATRIC: The patient is alert and oriented x 3.  SKIN: No obvious rash, lesion, or ulcer.   Physical Exam LABORATORY PANEL:   CBC  Recent Labs Lab 12/21/14 1000  WBC 6.1  HGB 14.6  HCT 42.9  PLT 159   ------------------------------------------------------------------------------------------------------------------  Chemistries   Recent Labs Lab 12/19/14 2245 12/20/14 0434 12/21/14 1000  NA 148*  --   --   K 3.5  --   --   CL 109  --   --   CO2 22  --   --   GLUCOSE 102*  --   --   BUN 13  --   --   CREATININE 1.26* 0.98  --   CALCIUM 10.8*  --   --   AST 30  --  17  ALT 18  --  14*  ALKPHOS 68  --  51  BILITOT 0.7  --  0.8   ------------------------------------------------------------------------------------------------------------------  Cardiac Enzymes  Recent Labs Lab 12/20/14 0958 12/20/14 1522  TROPONINI <0.03 <0.03   ------------------------------------------------------------------------------------------------------------------  RADIOLOGY:  Ct Abdomen Pelvis W Contrast  12/20/2014   CLINICAL DATA:  Severe central chest and upper abdominal pain for 2 days.  EXAM: CT ANGIOGRAPHY CHEST  CT ABDOMEN AND PELVIS WITH CONTRAST  TECHNIQUE: Multidetector CT imaging of the chest was performed using the standard protocol during bolus administration of intravenous contrast. Multiplanar CT image reconstructions and MIPs were obtained to evaluate the vascular anatomy. Multidetector CT imaging of the abdomen and pelvis was performed using the standard protocol during bolus  administration of intravenous contrast.  CONTRAST:  OMNIPAQUE IOHEXOL 350 MG/ML SOLN  COMPARISON:  CT abdomen/ pelvis 07/04/2014  FINDINGS: CTA CHEST FINDINGS  Normal caliber thoracic aorta without aneurysm, dissection, or acute aortic syndrome. There  is a conventional branching pattern from the aortic arch. No significant atherosclerotic disease. There are no filling defects in the central pulmonary arteries to suggest pulmonary embolus. The heart size is normal. There is no pleural or pericardial effusion. No mediastinal or hilar adenopathy.  Mild dependent linear densities in the right and left lower lobe consistent with atelectasis. The lungs are otherwise clear. No consolidation to suggest pneumonia. No findings of pulmonary edema. No pulmonary nodule or mass. There are no acute or suspicious osseous abnormalities.  CT ABDOMEN and PELVIS FINDINGS  The liver, spleen, pancreas, and adrenal glands are normal. The gallbladder is not seen, surgically absent. Minimal prominence of the common bile duct is a common finding postcholecystectomy. The kidneys demonstrate symmetric enhancement without hydronephrosis or localizing abnormality.  Stomach is physiologically distended. There are no dilated or thickened bowel loops. The appendix is normal. Small volume of stool throughout the colon, no colonic abnormality. No free air, free fluid, or intra-abdominal fluid collection.  No retroperitoneal adenopathy. Abdominal aorta is normal in caliber.  Within the pelvis the bladder is physiologically distended. Prostate gland is normal. No pelvic free fluid or adenopathy.  There are no acute or suspicious osseous abnormalities. Lucency within L5 vertebra extending to the superior endplate is unchanged dating back to 2013 and considered benign. Scattered bone islands are seen.  Review of the MIP images confirms the above findings.  IMPRESSION: 1. Mild dependent atelectasis in the right and left lower lobe. Otherwise normal CT of the chest. Normal thoracic aorta. 2. No acute abnormality in the abdomen/pelvis.  Postcholecystectomy.   Electronically Signed   By: Rubye Oaks M.D.   On: 12/20/2014 02:53   Nm Myocar Multi W/spect W/wall Motion / Ef  12/20/2014     Pharmacological myocardial perfusion imaging study with no significant  ischemia.  There was no ST segment deviation on EKG noted during stress or in  recovery.  The left ventricular ejection fraction is normal (55-65%). No wall  motion abnormality noted.  GI uptake artifact noted.  This is a low risk study.    Ct Angio Chest Aorta W/cm &/or Wo/cm  12/20/2014   CLINICAL DATA:  Severe central chest and upper abdominal pain for 2 days.  EXAM: CT ANGIOGRAPHY CHEST  CT ABDOMEN AND PELVIS WITH CONTRAST  TECHNIQUE: Multidetector CT imaging of the chest was performed using the standard protocol during bolus administration of intravenous contrast. Multiplanar CT image reconstructions and MIPs were obtained to evaluate the vascular anatomy. Multidetector CT imaging of the abdomen and pelvis was performed using the standard protocol during bolus administration of intravenous contrast.  CONTRAST:  OMNIPAQUE IOHEXOL 350 MG/ML SOLN  COMPARISON:  CT abdomen/ pelvis 07/04/2014  FINDINGS: CTA CHEST FINDINGS  Normal caliber thoracic aorta without aneurysm, dissection, or acute aortic syndrome. There is a conventional branching pattern from the aortic arch. No significant atherosclerotic disease. There are no filling defects in the central pulmonary arteries to suggest pulmonary embolus. The heart size is normal. There is no pleural or pericardial effusion. No mediastinal or hilar adenopathy.  Mild dependent linear densities in the right and left lower lobe consistent with atelectasis. The lungs are otherwise clear. No consolidation to suggest pneumonia. No findings of pulmonary edema. No pulmonary nodule or mass. There  are no acute or suspicious osseous abnormalities.  CT ABDOMEN and PELVIS FINDINGS  The liver, spleen, pancreas, and adrenal glands are normal. The gallbladder is not seen, surgically absent. Minimal prominence of the common bile duct is a common finding postcholecystectomy. The kidneys demonstrate  symmetric enhancement without hydronephrosis or localizing abnormality.  Stomach is physiologically distended. There are no dilated or thickened bowel loops. The appendix is normal. Small volume of stool throughout the colon, no colonic abnormality. No free air, free fluid, or intra-abdominal fluid collection.  No retroperitoneal adenopathy. Abdominal aorta is normal in caliber.  Within the pelvis the bladder is physiologically distended. Prostate gland is normal. No pelvic free fluid or adenopathy.  There are no acute or suspicious osseous abnormalities. Lucency within L5 vertebra extending to the superior endplate is unchanged dating back to 2013 and considered benign. Scattered bone islands are seen.  Review of the MIP images confirms the above findings.  IMPRESSION: 1. Mild dependent atelectasis in the right and left lower lobe. Otherwise normal CT of the chest. Normal thoracic aorta. 2. No acute abnormality in the abdomen/pelvis.  Postcholecystectomy.   Electronically Signed   By: Rubye OaksMelanie  Ehinger M.D.   On: 12/20/2014 02:53     ASSESSMENT AND PLAN:   34 year old African gentleman history of essential hypertension who originally presented for nausea, vomiting, chest tightness found to be in rapid atrial fibrillation.  1. New onset A. fib with rapid ventricular response:   Troponin negative, monitored on tele- spontaneous conversion to NSR.  TSH and electrolytes are within normal range.  Had some dehydration when came as a result for vomiting and diarrhea for last 2 days.  Likely that is the reason for A fib.  Low [place]HAD score and converted to NSR- no need for anticoagulants.  Cardio decided to do Echo and stress test as his brother had CAD. Negative.  2. Abdominal pain:  no acute finding on CT, likely his vomiting and diarrhea was a viral gastritis- or due to cannabinoid use.     Seen by GI and suggested conservative management, and liquid diet for 1-2 days.    Pt request , he will  do the same at home- instead of staying here.      D/c today.  3. Essential hypertension: BP stable.  4.Venous thromboembolism prophylactic: Heparin subcutaneous   All the records are reviewed and case discussed with Care Management/Social Workerr. Management plans discussed with the patient, family and they are in agreement.  CODE STATUS: full  TOTAL TIME TAKING CARE OF THIS PATIENT: 35 minutes.     Altamese DillingVACHHANI, Barnaby Rippeon M.D on 12/21/2014   Between 7am to 6pm - Pager - [phone]  After 6pm go to [email or web address] - password [email or web address] [facility]  [facility]  Office  [phone]  CC: Primary care physician; PROVIDER NOT IN SYSTEM

## 2014-12-21 NOTE — Progress Notes (Signed)
  Subjective: Pt vomited last night after eating milk & cereal.  Vomited once this AM as well.  He has 3/10 epigastric discomfort.  No diarrhea.  Objective: Vital signs in last 24 hours: Temp:  [98.3 F (36.8 C)-98.4 F (36.9 C)] 98.3 F (36.8 C) (05/20 0435) Pulse Rate:  [73-78] 73 (05/20 0435) Resp:  [18] 18 (05/20 0435) BP: (121-147)/(77-85) 121/77 mmHg (05/20 0435) SpO2:  [97 %-100 %] 97 % (05/20 0435) Weight:  [99.973 kg (220 lb 6.4 oz)] 99.973 kg (220 lb 6.4 oz) (05/20 0435) Last BM Date: 12/19/14 No LMP for male patient. Body mass index is 29.89 kg/(m^2). General:   Alert,  Well-developed, well-nourished, pleasant and cooperative in NAD Head:  Normocephalic and atraumatic. Eyes:  Sclera clear, no icterus.   Conjunctiva pink. Mouth:  No deformity or lesions, oropharynx pink & moist. Neck:  Supple; no masses or thyromegaly. Heart:  Regular rate and rhythm; no murmurs, clicks, rubs, or gallops. Abdomen:   Normal bowel sounds.  Soft, nondistended. +mild TTP epigastric area.  No masses, hepatosplenomegaly or hernias noted.  No guarding or rebound tenderness.   Msk:  Symmetrical without gross deformities. Good equal movement & strength bilaterally. Pulses:  Normal pulses noted. Extremities:  Without clubbing or edema.  No cyanosis Neurologic:  Alert and  oriented x3;  grossly normal neurologically. Skin:  Intact without significant lesions or rashes. Cervical Nodes:  No significant cervical adenopathy. Psych:  Alert and cooperative. Normal mood and affect.  Intake/Output from previous day: 05/19 0701 - 05/20 0700 In: 2998.3 [P.O.:600; I.V.:2398.3] Out: 2252 [Urine:2252]  Lab Results:  Recent Labs  12/19/14 2245 12/20/14 0434 12/21/14 1000  WBC 21.0* 10.4 6.1  HGB 17.4 14.4 14.6  HCT 52.3* 41.7 42.9  PLT 251 169 159   BMET  Recent Labs  12/19/14 2245 12/20/14 0434  NA 148*  --   K 3.5  --   CL 109  --   CO2 22  --   GLUCOSE 102*  --   BUN 13  --     CREATININE 1.26* 0.98  CALCIUM 10.8*  --    LFT  Recent Labs  12/19/14 2245 12/21/14 1000  PROT 9.5* 6.9  ALBUMIN 5.6* 4.1  AST 30 17  ALT 18 14*  ALKPHOS 68 51  BILITOT 0.7 0.8  BILIDIR <0.1* 0.1  IBILI NOT CALCULATED 0.7  LIPASE 42 35   Assessment: Acute abdominal pain, nausea, vomiting, and leukocytosis: Improving but not resolved.  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 CT scan of abdomen and pelvis with IV and oral contrast was reassuring. Hgb, WBC, lipase & LFTS all normal today.  I have discussed his care with Dr Midge Miniumarren Wohl.  Plan: 1) continue supportive measures including phenergan or zofran as symptoms may last 3-5 days 2) Protonix 40mg  daily will treat any gastritis or ulcers as well as acid reflux & should be continued outpatient 3) Urged marijuana cessation 4) Discussed push fluids, avoid Dairy, remain on clears for next 24-48 hours  [facility] will be for us covering over the weekend if needed  Lorenza BurtonJONES, Aarnav Steagall  12/21/2014, 11:23 AM [facility]  [address] [place], [place] [address] Phone: [phone] Fax : [phone]

## 2014-12-22 LAB — H. PYLORI ANTIBODY, IGG: H PYLORI IGG: 3.8 U/mL — AB (ref 0.0–0.8)

## 2015-01-11 ENCOUNTER — Encounter: Payer: Self-pay | Admitting: Cardiovascular Disease

## 2015-04-30 ENCOUNTER — Other Ambulatory Visit: Payer: Self-pay

## 2015-04-30 ENCOUNTER — Emergency Department: Payer: Self-pay

## 2015-04-30 ENCOUNTER — Encounter: Payer: Self-pay | Admitting: Emergency Medicine

## 2015-04-30 ENCOUNTER — Emergency Department
Admission: EM | Admit: 2015-04-30 | Discharge: 2015-05-01 | Disposition: A | Discharge status: Home / Self Care | Payer: Self-pay | Attending: Emergency Medicine | Admitting: Emergency Medicine

## 2015-04-30 DIAGNOSIS — I10 Essential (primary) hypertension: Secondary | ICD-10-CM | POA: Insufficient documentation

## 2015-04-30 DIAGNOSIS — Z720 Tobacco use: Secondary | ICD-10-CM | POA: Insufficient documentation

## 2015-04-30 DIAGNOSIS — F419 Anxiety disorder, unspecified: Secondary | ICD-10-CM | POA: Insufficient documentation

## 2015-04-30 DIAGNOSIS — K2100 Gastro-esophageal reflux disease with esophagitis, without bleeding: Secondary | ICD-10-CM

## 2015-04-30 DIAGNOSIS — R0602 Shortness of breath: Secondary | ICD-10-CM | POA: Insufficient documentation

## 2015-04-30 DIAGNOSIS — Z79899 Other long term (current) drug therapy: Secondary | ICD-10-CM | POA: Insufficient documentation

## 2015-04-30 LAB — COMPREHENSIVE METABOLIC PANEL
ALT: 22 U/L (ref 17–63)
ANION GAP: 11 (ref 5–15)
AST: 28 U/L (ref 15–41)
Albumin: 5.1 g/dL — ABNORMAL HIGH (ref 3.5–5.0)
Alkaline Phosphatase: 86 U/L (ref 38–126)
BUN: 11 mg/dL (ref 6–20)
CHLORIDE: 100 mmol/L — AB (ref 101–111)
CO2: 26 mmol/L (ref 22–32)
Calcium: 10.1 mg/dL (ref 8.9–10.3)
Creatinine, Ser: 1.25 mg/dL — ABNORMAL HIGH (ref 0.61–1.24)
Glucose, Bld: 105 mg/dL — ABNORMAL HIGH (ref 65–99)
POTASSIUM: 3.7 mmol/L (ref 3.5–5.1)
Sodium: 137 mmol/L (ref 135–145)
TOTAL PROTEIN: 8.6 g/dL — AB (ref 6.5–8.1)
Total Bilirubin: 0.6 mg/dL (ref 0.3–1.2)

## 2015-04-30 LAB — CBC WITH DIFFERENTIAL/PLATELET
BASOS ABS: 0.1 10*3/uL (ref 0–0.1)
BASOS PCT: 1 %
Eosinophils Absolute: 0.4 10*3/uL (ref 0–0.7)
Eosinophils Relative: 2 %
HCT: 50.4 % (ref 40.0–52.0)
HEMOGLOBIN: 16.7 g/dL (ref 13.0–18.0)
Lymphocytes Relative: 22 %
Lymphs Abs: 3.6 10*3/uL (ref 1.0–3.6)
MCH: 30.3 pg (ref 26.0–34.0)
MCHC: 33.1 g/dL (ref 32.0–36.0)
MCV: 91.3 fL (ref 80.0–100.0)
Monocytes Absolute: 1.1 10*3/uL — ABNORMAL HIGH (ref 0.2–1.0)
Monocytes Relative: 7 %
NEUTROS PCT: 68 %
Neutro Abs: 11 10*3/uL — ABNORMAL HIGH (ref 1.4–6.5)
Platelets: 214 10*3/uL (ref 150–440)
RBC: 5.52 MIL/uL (ref 4.40–5.90)
RDW: 12.7 % (ref 11.5–14.5)
WBC: 16.3 10*3/uL — AB (ref 3.8–10.6)

## 2015-04-30 LAB — TROPONIN I

## 2015-04-30 LAB — GLUCOSE, CAPILLARY: GLUCOSE-CAPILLARY: 105 mg/dL — AB (ref 65–99)

## 2015-04-30 LAB — LIPASE, BLOOD: LIPASE: 36 U/L (ref 22–51)

## 2015-04-30 LAB — PROTIME-INR
INR: 0.89
PROTHROMBIN TIME: 12.3 s (ref 11.4–15.0)

## 2015-04-30 LAB — FIBRIN DERIVATIVES D-DIMER (ARMC ONLY): FIBRIN DERIVATIVES D-DIMER (ARMC): 280 (ref 0–499)

## 2015-04-30 LAB — APTT: aPTT: 27 seconds (ref 24–36)

## 2015-04-30 MED ORDER — LORAZEPAM 2 MG/ML IJ SOLN
2.0000 mg | Freq: Once | INTRAMUSCULAR | Status: AC
  Administered 2015-04-30: 2 mg via INTRAVENOUS

## 2015-04-30 MED ORDER — GI COCKTAIL ~~LOC~~
30.0000 mL | ORAL | Status: AC
  Administered 2015-04-30: 30 mL via ORAL
  Filled 2015-04-30: qty 30

## 2015-04-30 MED ORDER — FAMOTIDINE 20 MG PO TABS
40.0000 mg | ORAL_TABLET | Freq: Once | ORAL | Status: AC
  Administered 2015-04-30: 40 mg via ORAL
  Filled 2015-04-30: qty 2

## 2015-04-30 MED ORDER — SODIUM CHLORIDE 0.9 % IV BOLUS (SEPSIS)
1000.0000 mL | Freq: Once | INTRAVENOUS | Status: AC
  Administered 2015-04-30: 1000 mL via INTRAVENOUS

## 2015-04-30 MED ORDER — ONDANSETRON HCL 4 MG/2ML IJ SOLN
4.0000 mg | Freq: Once | INTRAMUSCULAR | Status: AC
  Administered 2015-04-30: 4 mg via INTRAVENOUS

## 2015-04-30 MED ORDER — LORAZEPAM 2 MG/ML IJ SOLN
INTRAMUSCULAR | Status: AC
  Administered 2015-04-30: 2 mg via INTRAVENOUS
  Filled 2015-04-30: qty 1

## 2015-04-30 MED ORDER — HYDROMORPHONE HCL 1 MG/ML IJ SOLN
1.0000 mg | INTRAMUSCULAR | Status: AC
  Administered 2015-04-30: 1 mg via INTRAVENOUS
  Filled 2015-04-30: qty 1

## 2015-04-30 MED ORDER — SUCRALFATE 1 G PO TABS: 1.0000 g | ORAL_TABLET | Freq: Four times a day (QID) | ORAL | Status: DC

## 2015-04-30 MED ORDER — ONDANSETRON HCL 4 MG/2ML IJ SOLN
INTRAMUSCULAR | Status: AC
  Administered 2015-04-30: 4 mg via INTRAVENOUS
  Filled 2015-04-30: qty 2

## 2015-04-30 MED ORDER — RANITIDINE HCL 150 MG PO CAPS: 150.0000 mg | ORAL_CAPSULE | Freq: Two times a day (BID) | ORAL | Status: DC

## 2015-04-30 MED ORDER — NITROGLYCERIN 0.4 MG SL SUBL
0.4000 mg | SUBLINGUAL_TABLET | Freq: Once | SUBLINGUAL | Status: AC
  Administered 2015-04-30: 0.4 mg via SUBLINGUAL

## 2015-04-30 MED ORDER — NITROGLYCERIN 0.4 MG SL SUBL
SUBLINGUAL_TABLET | SUBLINGUAL | Status: AC
  Administered 2015-04-30: 0.4 mg via SUBLINGUAL
  Filled 2015-04-30: qty 1

## 2015-04-30 NOTE — ED Notes (Signed)
Pt arrived to the ED for complaints of chest pain diaphoretic and nauseous. Pt states that he pain began to experience the pain while watching tv and began to vomit and having cold sweats. Pt was taken straight to a room and MD at bedside.

## 2015-04-30 NOTE — ED Provider Notes (Signed)
[facility] Emergency Department Provider Note  ____________________________________________  Time seen: 9:15 PM on arrival  I have reviewed the triage vital signs and the nursing notes.   HISTORY  Chief Complaint Chest Pain    HPI Brandon Allen is a 34 y.o. male who complains of severe sudden onset chest pain radiating into his epigastrium. He feels short of breath and rates the pain a 10 out of 10. He feels that it may be related to either previous episodes of A. fib with RVR or something he ate as he is had GERD in the past and this feels similar. Last Dilaudid for pain. Pain is sharp,  positive vomiting. No recent travel or trauma. No sudden tearing pain.     Past Medical History  Diagnosis Date  . HTN (hypertension) 12/20/2014  . Kidney stones   . Deafness in right ear   . Atrial fibrillation     Dr Mariah Milling  . NSAID induced gastritis   . 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 Atrial fibrillation with rapid ventricular response 12/20/2014  . HTN (hypertension) 12/20/2014     Past Surgical History  Procedure Laterality Date  . Cholecystectomy    . Esophagogastroduodenoscopy    . Inner ear surgery    . Esophagogastroduodenoscopy  11/2011    NSAID-induced gastritis (Dr Bluford Kaufmann)     Current Outpatient Rx  Name  Route  Sig  Dispense  Refill  . acetaminophen (TYLENOL) 500 MG tablet   Oral   Take 500 mg by mouth every 6 (six) hours as needed.         . diltiazem (CARDIZEM) 30 MG tablet   Oral   Take 1 tablet (30 mg total) by mouth as needed (Count your heart rate for a full minute.  Take one pill as needed for heart rate greater than 110).   20 tablet   0   . ondansetron (ZOFRAN ODT) 4 MG disintegrating tablet   Oral   Take 1 tablet (4 mg total) by mouth every 8 (eight) hours as needed for nausea or vomiting.   20 tablet   0   . oxycodone-acetaminophen (LYNOX) 5-300  MG per tablet   Oral   Take 1 tablet by mouth every 6 (six) hours as needed for pain.   15 tablet   0   . pantoprazole (PROTONIX) 40 MG tablet   Oral   Take 1 tablet (40 mg total) by mouth 2 (two) times daily.   60 tablet   0   . promethazine (PHENERGAN) 25 MG tablet   Oral   Take 1 tablet (25 mg total) by mouth every 6 (six) hours as needed for nausea or vomiting.   15 tablet   0   . ranitidine (ZANTAC) 150 MG capsule   Oral   Take 1 capsule (150 mg total) by mouth 2 (two) times daily.   28 capsule   0   . sucralfate (CARAFATE) 1 G tablet   Oral   Take 1 tablet (1 g total) by mouth 4 (four) times daily.   120 tablet   1      Allergies Morphine and related   Family History  Problem Relation Age of Onset  . Heart failure Other   . Colon cancer Maternal Grandfather 27    Social History Social History  Substance Use Topics  . Smoking status:  Current Every Day Smoker -- 0.50 packs/day    Types: Cigarettes    Start date: 12/19/2009  . Smokeless tobacco: None  . Alcohol Use: No    Review of Systems  Constitutional:   No fever or chills. No weight changes Eyes:   No blurry vision or double vision.  ENT:   No sore throat. Cardiovascular:    Positive. Respiratory:   No dyspnea or cough. Gastrointestinal:   Negative for abdominal pain, positive  vomiting No BRBPR or melena. Genitourinary:   Negative for dysuria, urinary retention, bloody urine, or difficulty urinating. Musculoskeletal:   Negative for back pain. No joint swelling or pain. Skin:   Negative for rash. Neurological:   Negative for headaches, focal weakness or numbness. Psychiatric: positive anxiety.   Endocrine:  No hot/cold intolerance, changes in energy, or sleep difficulty.  10-point ROS otherwise negative.  ____________________________________________   PHYSICAL EXAM:  VITAL SIGNS: ED Triage Vitals  Enc Vitals Group     BP 04/30/15 2132 162/122 mmHg     Pulse Rate 04/30/15 2132 81      Resp 04/30/15 2132 22     Temp 04/30/15 2132 97.7 F (36.5 C)     Temp Source 04/30/15 2132 Oral     SpO2 04/30/15 2132 100 %     Weight 04/30/15 2132 225 lb (102.059 kg)     Height 04/30/15 2132 6' (1.829 m)     Head Cir --      Peak Flow --      Pain Score 04/30/15 2134 8     Pain Loc --      Pain Edu? --      Excl. in GC? --      Constitutional:   Alert and oriented. Moderate distress due to pain eyes:   No scleral icterus. No conjunctival pallor. PERRL. EOMI ENT   Head:   Normocephalic and atraumatic.   Nose:   No congestion/rhinnorhea. No septal hematoma   Mouth/Throat:   MMM, no pharyngeal erythema. No peritonsillar mass. No uvula shift.   Neck:   No stridor. No SubQ emphysema. No meningismus. Hematological/Lymphatic/Immunilogical:   No cervical lymphadenopathy. Cardiovascular:   RRR. Normal and symmetric distal pulses are present in all extremities. No murmurs, rubs, or gallops. Respiratory:   Normal respiratory effort without tachypnea nor retractions. Breath sounds are clear and equal bilaterally. No wheezes/rales/rhonchi. Gastrointestinal:    soft with epigastric tendernessNo distention. There is no CVA tenderness.  No rebound, rigidity, or guarding. Genitourinary:   deferred Musculoskeletal:   Nontender with normal range of motion in all extremities. No joint effusions.  No lower extremity tenderness.  No edema. Neurologic:   Normal speech and language.  CN 2-10 normal. Motor grossly intact. No pronator drift.  Normal gait. No gross focal neurologic deficits are appreciated.  Skin:    Skin is warm,  diaphoretic. No rash noted.  No petechiae, purpura, or bullae. Psychiatric:anxious  Speech and behavior are normal. Patient exhibits appropriate insight and judgment.  ____________________________________________    LABS (pertinent positives/negatives) (all labs ordered are listed, but only abnormal results are displayed) Labs Reviewed  COMPREHENSIVE  METABOLIC PANEL - Abnormal; Notable for the following:    Chloride 100 (*)    Glucose, Bld 105 (*)    Creatinine, Ser 1.25 (*)    Total Protein 8.6 (*)    Albumin 5.1 (*)    All other components within normal limits  CBC WITH DIFFERENTIAL/PLATELET - Abnormal; Notable for the following:  WBC 16.3 (*)    Neutro Abs 11.0 (*)    Monocytes Absolute 1.1 (*)    All other components within normal limits  GLUCOSE, CAPILLARY - Abnormal; Notable for the following:    Glucose-Capillary 105 (*)    All other components within normal limits  LIPASE, BLOOD  TROPONIN I  FIBRIN DERIVATIVES D-DIMER ([facility] ONLY)  PROTIME-INR  APTT   ____________________________________________   EKG  interpreted by me  Date: 04/30/2015  Rate: 94  Rhythm: normal sinus rhythm  QRS Axis: normal  Intervals: normal  ST/T Wave abnormalities: normal  Conduction Disutrbances: none  Narrative Interpretation: unremarkable    _______________________________________    RADIOLOGY   chest x-ray unremarkable  ____________________________________________   PROCEDURES   ____________________________________________   INITIAL IMPRESSION / ASSESSMENT AND PLAN / ED COURSE  Pertinent labs & imaging results that were available during my care of the patient were reviewed by me and considered in my medical decision making (see chart for details).  Patient presents with severe chest pain. He is in a sinus rhythm at a normal rate. We'll check labs and chest x-ray. Check a d-dimer. If workup is negative will consider possible CT angiogram to evaluate his aorta.  ----------------------------------------- 11:43 PM on 04/30/2015 -----------------------------------------  Upon reassessment of the patient at 10:45 PM, after Ativan, Zofran, nitroglycerin, and finally Dilaudid, the patient is now calm and comfortable. He was the Dilaudid that helped. Reassessment after all the other medications showed no significant  improvement. Workup negative. Patient feels comfortable that this is his GERD. Review of records reveals that I saw this patient in May of this year and I do recall a similar presentation. No chills that he presented with the same syndrome and complaints and had an extensive workup including CT angiogram of the chest and CT of the abdomen pelvis which were all unremarkable. At this point with his symptoms improved as d-dimer negative, I think it is not prudent to repeat any CT angiogram at this time. He has a normal chest x-ray and normal EKG and suspicion for aortic dissection is very low. Low suspicion for ACS or PE.     ____________________________________________   FINAL CLINICAL IMPRESSION(S) / ED DIAGNOSES  Final diagnoses:  Gastroesophageal reflux disease with esophagitis  Anxiety      Sharman Cheek, MD 04/30/15 [phone]

## 2015-04-30 NOTE — ED Notes (Signed)
Assumed pt care at this time. NAD noted. RR even and nonlabored. Family at bedside. Will continue to monitor. 

## 2015-04-30 NOTE — Discharge Instructions (Signed)
Gastroesophageal Reflux Disease, Adult  Gastroesophageal reflux disease (GERD) happens when acid from your stomach flows up into the esophagus. When acid comes in contact with the esophagus, the acid causes soreness (inflammation) in the esophagus. Over time, GERD may create small holes (ulcers) in the lining of the esophagus.  CAUSES   · Increased body weight. This puts pressure on the stomach, making acid rise from the stomach into the esophagus.  · Smoking. This increases acid production in the stomach.  · Drinking alcohol. This causes decreased pressure in the lower esophageal sphincter (valve or ring of muscle between the esophagus and stomach), allowing acid from the stomach into the esophagus.  · Late evening meals and a full stomach. This increases pressure and acid production in the stomach.  · A malformed lower esophageal sphincter.  Sometimes, no cause is found.  SYMPTOMS   · Burning pain in the lower part of the mid-chest behind the breastbone and in the mid-stomach area. This may occur twice a week or more often.  · Trouble swallowing.  · Sore throat.  · Dry cough.  · Asthma-like symptoms including chest tightness, shortness of breath, or wheezing.  DIAGNOSIS   Your caregiver may be able to diagnose GERD based on your symptoms. In some cases, X-rays and other tests may be done to check for complications or to check the condition of your stomach and esophagus.  TREATMENT   Your caregiver may recommend over-the-counter or prescription medicines to help decrease acid production. Ask your caregiver before starting or adding any new medicines.   HOME CARE INSTRUCTIONS   · Change the factors that you can control. Ask your caregiver for guidance concerning weight loss, quitting smoking, and alcohol consumption.  · Avoid foods and drinks that make your symptoms worse, such as:  ¨ Caffeine or alcoholic drinks.  ¨ Chocolate.  ¨ Peppermint or mint flavorings.  ¨ Garlic and onions.  ¨ Spicy foods.  ¨ Citrus fruits,  such as oranges, lemons, or limes.  ¨ Tomato-based foods such as sauce, chili, salsa, and pizza.  ¨ Fried and fatty foods.  · Avoid lying down for the 3 hours prior to your bedtime or prior to taking a nap.  · Eat small, frequent meals instead of large meals.  · Wear loose-fitting clothing. Do not wear anything tight around your waist that causes pressure on your stomach.  · Raise the head of your bed 6 to 8 inches with wood blocks to help you sleep. Extra pillows will not help.  · Only take over-the-counter or prescription medicines for pain, discomfort, or fever as directed by your caregiver.  · Do not take aspirin, ibuprofen, or other nonsteroidal anti-inflammatory drugs (NSAIDs).  SEEK IMMEDIATE MEDICAL CARE IF:   · You have pain in your arms, neck, jaw, teeth, or back.  · Your pain increases or changes in intensity or duration.  · You develop nausea, vomiting, or sweating (diaphoresis).  · You develop shortness of breath, or you faint.  · Your vomit is green, yellow, black, or looks like coffee grounds or blood.  · Your stool is red, bloody, or black.  These symptoms could be signs of other problems, such as heart disease, gastric bleeding, or esophageal bleeding.  MAKE SURE YOU:   · Understand these instructions.  · Will watch your condition.  · Will get help right away if you are not doing well or get worse.  Document Released: 04/29/2005 Document Revised: 10/12/2011 Document Reviewed: 02/06/2011  ExitCare® Patient   Information ©2015 [employer]. This information is not intended to replace advice given to you by your health care provider. Make sure you discuss any questions you have with your health care provider.  Food Choices for Gastroesophageal Reflux Disease  When you have gastroesophageal reflux disease (GERD), the foods you eat and your eating habits are very important. Choosing the right foods can help ease the discomfort of GERD.  WHAT GENERAL GUIDELINES DO I NEED TO FOLLOW?  · Choose fruits,  vegetables, whole grains, low-fat dairy products, and low-fat meat, fish, and poultry.  · Limit fats such as oils, salad dressings, butter, nuts, and avocado.  · Keep a food diary to identify foods that cause symptoms.  · Avoid foods that cause reflux. These may be different for different people.  · Eat frequent small meals instead of three large meals each day.  · Eat your meals slowly, in a relaxed setting.  · Limit fried foods.  · Cook foods using methods other than frying.  · Avoid drinking alcohol.  · Avoid drinking large amounts of liquids with your meals.  · Avoid bending over or lying down until 2-3 hours after eating.  WHAT FOODS ARE NOT RECOMMENDED?  The following are some foods and drinks that may worsen your symptoms:  Vegetables  Tomatoes. Tomato juice. Tomato and spaghetti sauce. Chili peppers. Onion and garlic. Horseradish.  Fruits  Oranges, grapefruit, and lemon (fruit and juice).  Meats  High-fat meats, fish, and poultry. This includes hot dogs, ribs, ham, sausage, salami, and bacon.  Dairy  Whole milk and chocolate milk. Sour cream. Cream. Butter. Ice cream. Cream cheese.   Beverages  Coffee and tea, with or without caffeine. Carbonated beverages or energy drinks.  Condiments  Hot sauce. Barbecue sauce.   Sweets/Desserts  Chocolate and cocoa. Donuts. Peppermint and spearmint.  Fats and Oils  High-fat foods, including French fries and potato chips.  Other  Vinegar. Strong spices, such as black pepper, white pepper, red pepper, cayenne, curry powder, cloves, ginger, and chili powder.  The items listed above may not be a complete list of foods and beverages to avoid. Contact your dietitian for more information.  Document Released: 07/20/2005 Document Revised: 07/25/2013 Document Reviewed: 05/24/2013  ExitCare® Patient Information ©2015 [employer]. This information is not intended to replace advice given to you by your health care provider. Make sure you discuss any questions you have with your  health care provider.

## 2015-07-05 ENCOUNTER — Emergency Department
Admission: EM | Admit: 2015-07-05 | Discharge: 2015-07-06 | Disposition: A | Discharge status: Home / Self Care | Payer: Self-pay | Attending: Emergency Medicine | Admitting: Emergency Medicine

## 2015-07-05 ENCOUNTER — Emergency Department: Payer: Self-pay

## 2015-07-05 ENCOUNTER — Encounter: Payer: Self-pay | Admitting: Emergency Medicine

## 2015-07-05 DIAGNOSIS — F1210 Cannabis abuse, uncomplicated: Secondary | ICD-10-CM | POA: Insufficient documentation

## 2015-07-05 DIAGNOSIS — R079 Chest pain, unspecified: Secondary | ICD-10-CM | POA: Insufficient documentation

## 2015-07-05 DIAGNOSIS — R1115 Cyclical vomiting syndrome unrelated to migraine: Secondary | ICD-10-CM

## 2015-07-05 DIAGNOSIS — Z79899 Other long term (current) drug therapy: Secondary | ICD-10-CM | POA: Insufficient documentation

## 2015-07-05 DIAGNOSIS — F419 Anxiety disorder, unspecified: Secondary | ICD-10-CM | POA: Insufficient documentation

## 2015-07-05 DIAGNOSIS — F17210 Nicotine dependence, cigarettes, uncomplicated: Secondary | ICD-10-CM | POA: Insufficient documentation

## 2015-07-05 DIAGNOSIS — G43A0 Cyclical vomiting, not intractable: Secondary | ICD-10-CM | POA: Insufficient documentation

## 2015-07-05 DIAGNOSIS — I10 Essential (primary) hypertension: Secondary | ICD-10-CM | POA: Insufficient documentation

## 2015-07-05 DIAGNOSIS — F1310 Sedative, hypnotic or anxiolytic abuse, uncomplicated: Secondary | ICD-10-CM | POA: Insufficient documentation

## 2015-07-05 DIAGNOSIS — F1110 Opioid abuse, uncomplicated: Secondary | ICD-10-CM | POA: Insufficient documentation

## 2015-07-05 LAB — CBC
HEMATOCRIT: 49.6 % (ref 40.0–52.0)
HEMOGLOBIN: 16.5 g/dL (ref 13.0–18.0)
MCH: 30.5 pg (ref 26.0–34.0)
MCHC: 33.3 g/dL (ref 32.0–36.0)
MCV: 91.6 fL (ref 80.0–100.0)
Platelets: 208 10*3/uL (ref 150–440)
RBC: 5.42 MIL/uL (ref 4.40–5.90)
RDW: 12.6 % (ref 11.5–14.5)
WBC: 14 10*3/uL — ABNORMAL HIGH (ref 3.8–10.6)

## 2015-07-05 LAB — BASIC METABOLIC PANEL
ANION GAP: 12 (ref 5–15)
BUN: 8 mg/dL (ref 6–20)
CO2: 23 mmol/L (ref 22–32)
Calcium: 10.9 mg/dL — ABNORMAL HIGH (ref 8.9–10.3)
Chloride: 108 mmol/L (ref 101–111)
Creatinine, Ser: 1.15 mg/dL (ref 0.61–1.24)
GFR calc Af Amer: >60 mL/min (ref >60)
GFR calc non Af Amer: >60 mL/min (ref >60)
GLUCOSE: 128 mg/dL — AB (ref 65–99)
POTASSIUM: 3.2 mmol/L — AB (ref 3.5–5.1)
Sodium: 143 mmol/L (ref 135–145)

## 2015-07-05 LAB — TROPONIN I: Troponin I: <0.03 ng/mL (ref <0.031)

## 2015-07-05 MED ORDER — HALOPERIDOL LACTATE 5 MG/ML IJ SOLN: 5.0000 mg | Freq: Once | INTRAMUSCULAR | Status: DC

## 2015-07-05 MED ORDER — PROMETHAZINE HCL 25 MG/ML IJ SOLN
INTRAMUSCULAR | Status: AC
  Administered 2015-07-05: 12.5 mg via INTRAVENOUS
  Filled 2015-07-05: qty 1

## 2015-07-05 MED ORDER — HYDROMORPHONE HCL 1 MG/ML IJ SOLN
1.0000 mg | INTRAMUSCULAR | Status: AC
  Administered 2015-07-05: 1 mg via INTRAVENOUS
  Filled 2015-07-05: qty 1

## 2015-07-05 MED ORDER — HYDROMORPHONE HCL 1 MG/ML IJ SOLN
1.0000 mg | Freq: Once | INTRAMUSCULAR | Status: AC
  Administered 2015-07-05: 1 mg via INTRAVENOUS

## 2015-07-05 MED ORDER — PROMETHAZINE HCL 25 MG/ML IJ SOLN
12.5000 mg | Freq: Once | INTRAMUSCULAR | Status: AC
  Administered 2015-07-05: 12.5 mg via INTRAVENOUS

## 2015-07-05 MED ORDER — LORAZEPAM 2 MG/ML IJ SOLN
2.0000 mg | Freq: Once | INTRAMUSCULAR | Status: AC
  Administered 2015-07-05: 2 mg via INTRAVENOUS
  Filled 2015-07-05: qty 1

## 2015-07-05 MED ORDER — HALOPERIDOL LACTATE 5 MG/ML IJ SOLN
1.0000 mg | Freq: Once | INTRAMUSCULAR | Status: AC
  Administered 2015-07-05: 1 mg via INTRAVENOUS
  Filled 2015-07-05: qty 1

## 2015-07-05 MED ORDER — DIPHENHYDRAMINE HCL 50 MG/ML IJ SOLN
25.0000 mg | Freq: Once | INTRAMUSCULAR | Status: AC
  Administered 2015-07-05: 25 mg via INTRAVENOUS
  Filled 2015-07-05: qty 1

## 2015-07-05 MED ORDER — HALOPERIDOL LACTATE 5 MG/ML IJ SOLN
3.0000 mg | Freq: Once | INTRAMUSCULAR | Status: DC
  Filled 2015-07-05: qty 1

## 2015-07-05 MED ORDER — HYDROMORPHONE HCL 1 MG/ML IJ SOLN
INTRAMUSCULAR | Status: AC
  Administered 2015-07-05: 1 mg via INTRAVENOUS
  Filled 2015-07-05: qty 1

## 2015-07-05 MED ORDER — SODIUM CHLORIDE 0.9 % IV BOLUS (SEPSIS)
1000.0000 mL | Freq: Once | INTRAVENOUS | Status: AC
  Administered 2015-07-05: 1000 mL via INTRAVENOUS

## 2015-07-05 MED ORDER — ONDANSETRON HCL 4 MG/2ML IJ SOLN
4.0000 mg | Freq: Once | INTRAMUSCULAR | Status: AC
  Administered 2015-07-05: 4 mg via INTRAVENOUS
  Filled 2015-07-05: qty 2

## 2015-07-05 NOTE — ED Notes (Signed)
pt reports abdominal pain and chest pain started suddenly tonight pt is extremely diaphoretic

## 2015-07-06 LAB — URINE DRUG SCREEN, QUALITATIVE (ARMC ONLY)
AMPHETAMINES, UR SCREEN: NOT DETECTED
BENZODIAZEPINE, UR SCRN: POSITIVE — AB
Barbiturates, Ur Screen: NOT DETECTED
Cannabinoid 50 Ng, Ur ~~LOC~~: POSITIVE — AB
Cocaine Metabolite,Ur ~~LOC~~: NOT DETECTED
MDMA (Ecstasy)Ur Screen: NOT DETECTED
METHADONE SCREEN, URINE: NOT DETECTED
Opiate, Ur Screen: POSITIVE — AB
Phencyclidine (PCP) Ur S: NOT DETECTED
TRICYCLIC, UR SCREEN: NOT DETECTED

## 2015-07-06 MED ORDER — PROMETHAZINE HCL 12.5 MG RE SUPP: 12.5000 mg | Freq: Four times a day (QID) | RECTAL | Status: DC | PRN

## 2015-07-06 MED ORDER — PROMETHAZINE HCL 12.5 MG PO TABS: 12.5000 mg | ORAL_TABLET | Freq: Four times a day (QID) | ORAL | Status: DC | PRN

## 2015-07-06 NOTE — ED Provider Notes (Signed)
Time Seen: Approximately 2300 I have reviewed the triage notes  Chief Complaint: Emesis   History of Present Illness: Brandon Allen is a 34 y.o. 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 Patient denies any recent marijuana but states this evening that he started developing same symptoms of persistent nausea and vomiting at home with substernal chest discomfort. The patient denies any hematemesis or biliary emesis. He denies any loose stool or diarrhea. Denies any melena or hematochezia. He states he feels very diaphoretic but denies any fever at home. Prior to my evaluation patient was given Haldol and Dilaudid for pain.   Past Medical History  Diagnosis Date  . HTN (hypertension) 12/20/2014  . Kidney stones   . Deafness in right ear   . Atrial fibrillation (HCC)     Dr Mariah MillingGollan  . NSAID induced gastritis   . 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 Atrial fibrillation with rapid ventricular response (HCC) 12/20/2014  . HTN (hypertension) 12/20/2014    Past Surgical History  Procedure Laterality Date  . Cholecystectomy    . Esophagogastroduodenoscopy    . Inner ear surgery    . Esophagogastroduodenoscopy  11/2011    NSAID-induced gastritis (Dr Bluford Kaufmannh)    Past Surgical History  Procedure Laterality Date  . Cholecystectomy    . Esophagogastroduodenoscopy    . Inner ear surgery    . Esophagogastroduodenoscopy  11/2011    NSAID-induced gastritis (Dr Bluford Kaufmannh)    Current Outpatient Rx  Name  Route  Sig  Dispense  Refill  . acetaminophen (TYLENOL) 500 MG tablet   Oral   Take 500 mg by mouth every 6 (six) hours as needed.         . diltiazem (CARDIZEM) 30 MG tablet   Oral   Take 1 tablet (30 mg total) by mouth as needed (Count your heart rate for a full minute.  Take one pill as needed for heart rate greater than 110). Patient not taking: Reported  on 07/06/2015   20 tablet   0   . ondansetron (ZOFRAN ODT) 4 MG disintegrating tablet   Oral   Take 1 tablet (4 mg total) by mouth every 8 (eight) hours as needed for nausea or vomiting. Patient not taking: Reported on 07/06/2015   20 tablet   0   . oxycodone-acetaminophen (LYNOX) 5-300 MG per tablet   Oral   Take 1 tablet by mouth every 6 (six) hours as needed for pain. Patient not taking: Reported on 07/06/2015   15 tablet   0   . pantoprazole (PROTONIX) 40 MG tablet   Oral   Take 1 tablet (40 mg total) by mouth 2 (two) times daily. Patient not taking: Reported on 07/06/2015   60 tablet   0   . promethazine (PHENERGAN) 12.5 MG suppository   Rectal   Place 1 suppository (12.5 mg total) rectally every 6 (six) hours as needed for nausea or vomiting.   12 each   0   . promethazine (PHENERGAN) 12.5 MG tablet   Oral   Take 1 tablet (12.5 mg total) by mouth every 6 (six) hours as needed for nausea or vomiting.   30 tablet   0   . ranitidine (ZANTAC) 150 MG capsule   Oral   Take 1 capsule (150 mg total) by mouth 2 (  two) times daily. Patient not taking: Reported on 07/06/2015   28 capsule   0   . sucralfate (CARAFATE) 1 G tablet   Oral   Take 1 tablet (1 g total) by mouth 4 (four) times daily. Patient not taking: Reported on 07/06/2015   120 tablet   1     Allergies:  Morphine and related  Family History: Family History  Problem Relation Age of Onset  . Heart failure Other   . Colon cancer Maternal Grandfather 80    Social History: Social History  Substance Use Topics  . Smoking status: Current Every Day Smoker -- 0.50 packs/day    Types: Cigarettes    Start date: 12/19/2009  . Smokeless tobacco: None  . Alcohol Use: No     Review of Systems:   10 point review of systems was performed and was otherwise negative:  Constitutional: No fever Eyes: No visual disturbances ENT: No sore throat, ear pain Cardiac: Chest pain is substernal and started after  the vomiting had begun. Respiratory: No shortness of breath, wheezing, or stridor Abdomen: No abdominal pain, no vomiting, No diarrhea Endocrine: No weight loss, No night sweats Extremities: No peripheral edema, cyanosis Skin: No rashes, easy bruising Neurologic: No focal weakness, trouble with speech or swollowing Urologic: No dysuria, Hematuria, or urinary frequency   Physical Exam:  ED Triage Vitals  Enc Vitals Group     BP 07/05/15 2143 191/115 mmHg     Pulse Rate 07/05/15 2143 97     Resp 07/05/15 2143 20     Temp 07/05/15 2143 97.9 F (36.6 C)     Temp Source 07/05/15 2344 Oral     SpO2 07/05/15 2143 100 %     Weight 07/05/15 2143 220 lb (99.791 kg)     Height 07/05/15 2143 6' (1.829 m)     Head Cir --      Peak Flow --      Pain Score 07/05/15 2144 10     Pain Loc --      Pain Edu? --      Excl. in GC? --     General: Awake , Alert , and Oriented times 3; GCS 15 somewhat dramatic historian with some associated anxiety Head: Normal cephalic , atraumatic Eyes: Pupils equal , round, reactive to light Nose/Throat: No nasal drainage, patent upper airway without erythema or exudate.  Neck: Supple, Full range of motion, No anterior adenopathy or palpable thyroid masses Lungs: Clear to ascultation without wheezes , rhonchi, or rales Heart: Regular rate, regular rhythm without murmurs , gallops , or rubs Abdomen: Soft, non tender without rebound, guarding , or rigidity; bowel sounds positive and symmetric in all 4 quadrants. No organomegaly .        Extremities: 2 plus symmetric pulses. No edema, clubbing or cyanosis Neurologic: normal ambulation, Motor symmetric without deficits, sensory intact Skin: warm, dry, no rashes   Labs:   All laboratory work was reviewed including any pertinent negatives or positives listed below:  Labs Reviewed  BASIC METABOLIC PANEL - Abnormal; Notable for the following:    Potassium 3.2 (*)    Glucose, Bld 128 (*)    Calcium 10.9 (*)     All other components within normal limits  CBC - Abnormal; Notable for the following:    WBC 14.0 (*)    All other components within normal limits  URINE DRUG SCREEN, QUALITATIVE ([facility] ONLY) - Abnormal; Notable for the following:    Opiate, Ur Screen  POSITIVE (*)    Cannabinoid 50 Ng, Ur Vale POSITIVE (*)    Benzodiazepine, Ur Scrn POSITIVE (*)    All other components within normal limits  TROPONIN I    EKG: * ED ECG REPORT I, Jennye Moccasin, the attending physician, personally viewed and interpreted this ECG.  Date: 07/06/2015 EKG Time: 2148 Rate: *82 Rhythm: normal sinus rhythm QRS Axis: normal Intervals: normal ST/T Wave abnormalities: normal Conduction Disutrbances: none Narrative Interpretation: unremarkable No acute ischemic changes   Radiology:   Final result by Rad Results In Interface (07/05/15 22:17:21)   Narrative:   CLINICAL DATA: Chest pain  EXAM: PORTABLE CHEST 1 VIEW  COMPARISON: 04/30/2015 chest radiograph.  FINDINGS: Stable cardiomediastinal silhouette with normal heart size. No pneumothorax. No pleural effusion. Clear lungs, with no focal lung consolidation and no pulmonary edema.  IMPRESSION: No active disease.        I personally reviewed the radiologic studies   P ED Course:  Patient with symptomatic relief with IV fluid bolus. He also received IV Dilaudid, IV Phenergan. He also received Ativan review of previous records showed that the Dilaudid and Ativan combination seemed to be the most effective. Patient did not tolerate the Haldol well and declined on any other further doses of the medication. He was discharged pain free and symptomatically improved and encouraged to follow up with gastroenterology. He was advised take over-the-counter antacid medications.  Assessment:  Cyclic vomiting syndrome   Final Clinical Impression: *  Final diagnoses:  Non-intractable cyclical vomiting with nausea     Plan: * Outpatient  management Patient was advised to return immediately if condition worsens. Patient was advised to follow up with her primary care physician or other specialized physicians involved in their outpatient care             Jennye Moccasin, MD 07/06/15 [phone]

## 2015-07-06 NOTE — ED Notes (Signed)
Patient noted resting comfortably.  

## 2015-07-06 NOTE — ED Notes (Signed)
Patient with no complaints at this time. Respirations even and unlabored. Skin warm/dry. Discharge instructions reviewed with patient at this time. Patient given opportunity to voice concerns/ask questions. IV removed per policy and band-aid applied to site. Patient discharged at this time and left Emergency Department, via wheelchair.   

## 2015-07-13 ENCOUNTER — Emergency Department
Admission: EM | Admit: 2015-07-13 | Discharge: 2015-07-13 | Disposition: A | Discharge status: Home / Self Care | Payer: Self-pay | Attending: Emergency Medicine | Admitting: Emergency Medicine

## 2015-07-13 ENCOUNTER — Other Ambulatory Visit: Payer: Self-pay

## 2015-07-13 ENCOUNTER — Encounter: Payer: Self-pay | Admitting: Emergency Medicine

## 2015-07-13 DIAGNOSIS — I10 Essential (primary) hypertension: Secondary | ICD-10-CM | POA: Insufficient documentation

## 2015-07-13 DIAGNOSIS — R1013 Epigastric pain: Secondary | ICD-10-CM | POA: Insufficient documentation

## 2015-07-13 DIAGNOSIS — F17210 Nicotine dependence, cigarettes, uncomplicated: Secondary | ICD-10-CM | POA: Insufficient documentation

## 2015-07-13 DIAGNOSIS — F1290 Cannabis use, unspecified, uncomplicated: Secondary | ICD-10-CM | POA: Insufficient documentation

## 2015-07-13 DIAGNOSIS — R61 Generalized hyperhidrosis: Secondary | ICD-10-CM | POA: Insufficient documentation

## 2015-07-13 DIAGNOSIS — F419 Anxiety disorder, unspecified: Secondary | ICD-10-CM | POA: Insufficient documentation

## 2015-07-13 DIAGNOSIS — F12988 Cannabis use, unspecified with other cannabis-induced disorder: Secondary | ICD-10-CM

## 2015-07-13 DIAGNOSIS — R112 Nausea with vomiting, unspecified: Secondary | ICD-10-CM | POA: Insufficient documentation

## 2015-07-13 LAB — BASIC METABOLIC PANEL
ANION GAP: 11 (ref 5–15)
BUN: 8 mg/dL (ref 6–20)
CHLORIDE: 107 mmol/L (ref 101–111)
CO2: 23 mmol/L (ref 22–32)
CREATININE: 1.13 mg/dL (ref 0.61–1.24)
Calcium: 10.2 mg/dL (ref 8.9–10.3)
GFR calc non Af Amer: >60 mL/min (ref >60)
Glucose, Bld: 119 mg/dL — ABNORMAL HIGH (ref 65–99)
POTASSIUM: 3.4 mmol/L — AB (ref 3.5–5.1)
SODIUM: 141 mmol/L (ref 135–145)

## 2015-07-13 LAB — CBC
HEMATOCRIT: 48.4 % (ref 40.0–52.0)
HEMOGLOBIN: 16 g/dL (ref 13.0–18.0)
MCH: 30.5 pg (ref 26.0–34.0)
MCHC: 33.1 g/dL (ref 32.0–36.0)
MCV: 92 fL (ref 80.0–100.0)
PLATELETS: 246 10*3/uL (ref 150–440)
RBC: 5.27 MIL/uL (ref 4.40–5.90)
RDW: 12.4 % (ref 11.5–14.5)
WBC: 18.7 10*3/uL — AB (ref 3.8–10.6)

## 2015-07-13 LAB — LIPASE, BLOOD: Lipase: 33 U/L (ref 11–51)

## 2015-07-13 LAB — TROPONIN I: Troponin I: <0.03 ng/mL (ref <0.031)

## 2015-07-13 MED ORDER — LORAZEPAM 2 MG/ML IJ SOLN
2.0000 mg | Freq: Once | INTRAMUSCULAR | Status: AC
  Administered 2015-07-13: 2 mg via INTRAVENOUS
  Filled 2015-07-13: qty 1

## 2015-07-13 MED ORDER — PROMETHAZINE HCL 25 MG PO TABS: 25.0000 mg | ORAL_TABLET | Freq: Four times a day (QID) | ORAL | Status: DC | PRN

## 2015-07-13 MED ORDER — HALOPERIDOL LACTATE 5 MG/ML IJ SOLN
2.0000 mg | Freq: Once | INTRAMUSCULAR | Status: AC
  Administered 2015-07-13: 2 mg via INTRAVENOUS
  Filled 2015-07-13: qty 1

## 2015-07-13 MED ORDER — SODIUM CHLORIDE 0.9 % IV SOLN
Freq: Once | INTRAVENOUS | Status: AC
  Administered 2015-07-13: 22:00:00 via INTRAVENOUS

## 2015-07-13 MED ORDER — PROMETHAZINE HCL 25 MG/ML IJ SOLN
INTRAMUSCULAR | Status: AC
  Administered 2015-07-13: 25 mg via INTRAVENOUS
  Filled 2015-07-13: qty 1

## 2015-07-13 MED ORDER — PROMETHAZINE HCL 25 MG/ML IJ SOLN: 25.0000 mg | Freq: Once | INTRAMUSCULAR | Status: DC

## 2015-07-13 MED ORDER — PROMETHAZINE HCL 25 MG/ML IJ SOLN
25.0000 mg | Freq: Once | INTRAMUSCULAR | Status: AC
  Administered 2015-07-13: 25 mg via INTRAVENOUS

## 2015-07-13 MED ORDER — ONDANSETRON HCL 4 MG/2ML IJ SOLN
4.0000 mg | Freq: Once | INTRAMUSCULAR | Status: AC
  Administered 2015-07-13: 4 mg via INTRAVENOUS
  Filled 2015-07-13: qty 2

## 2015-07-13 MED ORDER — PROMETHAZINE HCL 25 MG RE SUPP: 25.0000 mg | Freq: Four times a day (QID) | RECTAL | Status: DC | PRN

## 2015-07-13 NOTE — ED Notes (Addendum)
Pt seen here recently for same.  C/o central chest pain with NV that started 2 hours ago.  Pt is diaphoretic in triage.  Vomiting in triage.

## 2015-07-13 NOTE — Discharge Instructions (Signed)
Cannabis Use Disorder °Cannabis use disorder is a mental disorder. It is not one-time or occasional use of cannabis, more commonly known as marijuana. Cannabis use disorder is the continued, nonmedical use of cannabis that interferes with normal life activities or causes health problems. People with cannabis use disorder get a feeling of extreme pleasure and relaxation from cannabis use. This "high" is very rewarding and causes people to use over and over.  °The mind-altering ingredient in cannabis is know as THC. THC can also interfere with motor coordination, memory, judgment, and accurate sense of space and time. These effects can last for a few days after using cannabis. Regular heavy cannabis use can cause long-lasting problems with thinking and learning. In young people, these problems may be permanent. Cannabis sometimes causes severe anxiety, paranoia, or visual hallucinations. Man-made (synthetic) cannabis-like drugs, such as "spice" and "K2," cause the same effects as THC but are much stronger. Cannabis-like drugs can cause dangerously high blood pressure and heart rate.  °Cannabis use disorder usually starts in the teenage years. It can trigger the development of schizophrenia. It is somewhat more common in men than women. People who have family members with the disorder or existing mental health issues such as depression and posttraumatic stress disorder are more likely to develop cannabis use disorder. People with cannabis use disorder are at higher risk for use of other drugs of abuse.  °SIGNS AND SYMPTOMS °Signs and symptoms of cannabis use disorder include:  °· Use of cannabis in larger amounts or over a longer period than intended.   °· Unsuccessful attempts to cut down or control cannabis use.   °· A lot of time spent obtaining, using, or recovering from the effects of cannabis.   °· A strong desire or urge to use cannabis (cravings).   °· Continued use of cannabis in spite of problems at work,  school, or home because of use.   °· Continued use of cannabis in spite of relationship problems because of use. °· Giving up or cutting down on important life activities because of cannabis use. °· Use of cannabis over and over even in situations when it is physically hazardous, such as when driving a car.   °· Continued use of cannabis in spite of a physical problem that is likely related to use. Physical problems can include: °· Chronic cough. °· Bronchitis. °· Emphysema. °· Throat and lung cancer. °· Continued use of cannabis in spite of a mental problem that is likely related to use. Mental problems can include: °· Psychosis. °· Anxiety. °· Difficulty sleeping. °· Need to use more and more cannabis to get the same effect, or lessened effect over time with use of the same amount (tolerance). °· Having withdrawal symptoms when cannabis use is stopped, or using cannabis to reduce or avoid withdrawal symptoms. Withdrawal symptoms include: °· Irritability or anger. °· Anxiety or restlessness. °· Difficulty sleeping. °· Loss of appetite or weight. °· Aches and pains. °· Shakiness. °· Sweating. °· Chills. °DIAGNOSIS °Cannabis use disorder is diagnosed by your health care provider. You may be asked questions about your cannabis use and how it affects your life. A physical exam may be done. A drug screen may be done. You may be referred to a mental health professional. The diagnosis of cannabis use disorder requires at least two symptoms within 12 months. The type of cannabis use disorder you have depends on the number of symptoms you have. The type may be: °· Mild. Two or three signs and symptoms.   °· Moderate. Four or   five signs and symptoms.   °· Severe. Six or more signs and symptoms.   °TREATMENT °Treatment is usually provided by mental health professionals with training in substance use disorders. The following options are available: °· Counseling or talk therapy. Talk therapy addresses the reasons you use  cannabis. It also addresses ways to keep you from using again. The goals of talk therapy include: °¨ Identifying and avoiding triggers for use. °¨ Learning how to handle cravings. °¨ Replacing use with healthy activities. °· Support groups. Support groups provide emotional support, advice, and guidance. °· Medicine. Medicine is used to treat mental health issues that trigger cannabis use or that result from it. °HOME CARE INSTRUCTIONS °· Take medicines only as directed by your health care provider. °· Check with your health care provider before starting any new medicines. °· Keep all follow-up visits as directed by your health care provider. °SEEK MEDICAL CARE IF: °· You are not able to take your medicines as directed. °· Your symptoms get worse. °SEEK IMMEDIATE MEDICAL CARE IF: °You have serious thoughts about hurting yourself or others. °FOR MORE INFORMATION °· National Institute on Drug Abuse: www.drugabuse.gov °· Substance Abuse and Mental Health Services Administration: www.samhsa.gov °  °This information is not intended to replace advice given to you by your health care provider. Make sure you discuss any questions you have with your health care provider. °  °Document Released: 07/17/2000 Document Revised: 08/10/2014 Document Reviewed: 08/02/2013 °Elsevier Interactive Patient Education ©2016 [employer] °Nausea and Vomiting °Nausea is a sick feeling that often comes before throwing up (vomiting). Vomiting is a reflex where stomach contents come out of your mouth. Vomiting can cause severe loss of body fluids (dehydration). Children and elderly adults can become dehydrated quickly, especially if they also have diarrhea. Nausea and vomiting are symptoms of a condition or disease. It is important to find the cause of your symptoms. °CAUSES  °· Direct irritation of the stomach lining. This irritation can result from increased acid production (gastroesophageal reflux disease), infection, food poisoning, taking  certain medicines (such as nonsteroidal anti-inflammatory drugs), alcohol use, or tobacco use. °· Signals from the brain. These signals could be caused by a headache, heat exposure, an inner ear disturbance, increased pressure in the brain from injury, infection, a tumor, or a concussion, pain, emotional stimulus, or metabolic problems. °· An obstruction in the gastrointestinal tract (bowel obstruction). °· Illnesses such as diabetes, hepatitis, gallbladder problems, appendicitis, kidney problems, cancer, sepsis, atypical symptoms of a heart attack, or eating disorders. °· Medical treatments such as chemotherapy and radiation. °· Receiving medicine that makes you sleep (general anesthetic) during surgery. °DIAGNOSIS °Your caregiver may ask for tests to be done if the problems do not improve after a few days. Tests may also be done if symptoms are severe or if the reason for the nausea and vomiting is not clear. Tests may include: °· Urine tests. °· Blood tests. °· Stool tests. °· Cultures (to look for evidence of infection). °· X-rays or other imaging studies. °Test results can help your caregiver make decisions about treatment or the need for additional tests. °TREATMENT °You need to stay well hydrated. Drink frequently but in small amounts. You may wish to drink water, sports drinks, clear broth, or eat frozen ice pops or gelatin dessert to help stay hydrated. When you eat, eating slowly may help prevent nausea. There are also some antinausea medicines that may help prevent nausea. °HOME CARE INSTRUCTIONS  °· Take all medicine as directed by your caregiver. °· If you do not   have an appetite, do not force yourself to eat. However, you must continue to drink fluids. °· If you have an appetite, eat a normal diet unless your caregiver tells you differently. °¨ Eat a variety of complex carbohydrates (rice, wheat, potatoes, bread), lean meats, yogurt, fruits, and vegetables. °¨ Avoid high-fat foods because they are more  difficult to digest. °· Drink enough water and fluids to keep your urine clear or pale yellow. °· If you are dehydrated, ask your caregiver for specific rehydration instructions. Signs of dehydration may include: °¨ Severe thirst. °¨ Dry lips and mouth. °¨ Dizziness. °¨ Dark urine. °¨ Decreasing urine frequency and amount. °¨ Confusion. °¨ Rapid breathing or pulse. °SEEK IMMEDIATE MEDICAL CARE IF:  °· You have blood or brown flecks (like coffee grounds) in your vomit. °· You have black or bloody stools. °· You have a severe headache or stiff neck. °· You are confused. °· You have severe abdominal pain. °· You have chest pain or trouble breathing. °· You do not urinate at least once every 8 hours. °· You develop cold or clammy skin. °· You continue to vomit for longer than 24 to 48 hours. °· You have a fever. °MAKE SURE YOU:  °· Understand these instructions. °· Will watch your condition. °· Will get help right away if you are not doing well or get worse. °  °This information is not intended to replace advice given to you by your health care provider. Make sure you discuss any questions you have with your health care provider. °  °Document Released: 07/20/2005 Document Revised: 10/12/2011 Document Reviewed: 12/17/2010 °Elsevier Interactive Patient Education ©2016 [employer] ° °

## 2015-07-13 NOTE — ED Notes (Signed)
Girlfriend at bedside. Pt requesting pain mediation "to help me feel better". ivf infusing without difficulty. Call bell at side. Pt readjusted in bed for comfort.

## 2015-07-13 NOTE — ED Provider Notes (Addendum)
[facility] Emergency Department Provider Note     Time seen: ----------------------------------------- 9:23 PM on 07/13/2015 -----------------------------------------    I have reviewed the triage vital signs and the nursing notes.   HISTORY  Chief Complaint Chest Pain    HPI Brandon Allen is a 34 y.o. male who presents ER for chest pain with nausea vomiting started 2 hours ago. Patient brought in diaphoretic, vomiting in triage. Patient was here recently for same, reportedly has cyclic vomiting syndrome that is secondary to frequent marijuana use. Patient states he has smoked some marijuana today, usually uses every day.   Past Medical History  Diagnosis Date  . HTN (hypertension) 12/20/2014  . Kidney stones   . Deafness in right ear   . Atrial fibrillation (HCC)     Dr Mariah MillingGollan  . NSAID induced gastritis   . 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 Atrial fibrillation with rapid ventricular response (HCC) 12/20/2014  . HTN (hypertension) 12/20/2014    Past Surgical History  Procedure Laterality Date  . Cholecystectomy    . Esophagogastroduodenoscopy    . Inner ear surgery    . Esophagogastroduodenoscopy  11/2011    NSAID-induced gastritis (Dr Bluford Kaufmannh)    Allergies Morphine and related  Social History Social History  Substance Use Topics  . Smoking status: Current Every Day Smoker -- 0.50 packs/day    Types: Cigarettes    Start date: 12/19/2009  . Smokeless tobacco: None  . Alcohol Use: No    Review of Systems Constitutional: Negative for fever. Eyes: Negative for visual changes. ENT: Negative for sore throat. Cardiovascular: Positive for chest pain Respiratory: Negative for shortness of breath. Gastrointestinal: Positive for abdominal pain, vomiting Genitourinary: Negative for dysuria. Musculoskeletal: Negative for back pain. Skin: Positive for  diaphoresis Neurological: Negative for headaches, focal weakness or numbness.  10-point ROS otherwise negative.  ____________________________________________   PHYSICAL EXAM:  VITAL SIGNS: ED Triage Vitals  Enc Vitals Group     BP 07/13/15 2056 166/110 mmHg     Pulse Rate 07/13/15 2056 88     Resp 07/13/15 2056 28     Temp 07/13/15 2056 98 F (36.7 C)     Temp Source 07/13/15 2056 Oral     SpO2 07/13/15 2056 99 %     Weight 07/13/15 2056 220 lb (99.791 kg)     Height 07/13/15 2056 6' (1.829 m)     Head Cir --      Peak Flow --      Pain Score 07/13/15 2054 10     Pain Loc --      Pain Edu? --      Excl. in GC? --     Constitutional: Alert and oriented, profusely diaphoretic, mild distress and anxious. Eyes: Conjunctivae are normal. PERRL. Normal extraocular movements. ENT   Head: Normocephalic and atraumatic.   Nose: No congestion/rhinnorhea.   Mouth/Throat: Mucous membranes are moist.   Neck: No stridor. Cardiovascular: Normal rate, regular rhythm. Normal and symmetric distal pulses are present in all extremities. No murmurs, rubs, or gallops. Respiratory: Normal respiratory effort without tachypnea nor retractions. Breath sounds are clear and equal bilaterally. No wheezes/rales/rhonchi. Gastrointestinal: Epigastric tenderness, no rebound or guarding. Normal bowel sounds. Musculoskeletal: Nontender with normal range of motion in all extremities. No joint effusions.  No lower extremity tenderness nor edema. Neurologic:  Normal speech and language. No gross focal neurologic  deficits are appreciated. Speech is normal. No gait instability. Skin:  Profuse diaphoresis. Psychiatric: Mood and affect are normal. Speech and behavior are normal. Patient exhibits appropriate insight and judgment. ____________________________________________  EKG: Interpreted by me. Sinus rhythm with a rate of 86 bpm, normal PR interval, normal QS with, normal QT  interval.  ____________________________________________  ED COURSE:  Pertinent labs & imaging results that were available during my care of the patient were reviewed by me and considered in my medical decision making (see chart for details). 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 Patient will receive fluids antiemetics and likely antipsychotics. ____________________________________________    LABS (pertinent positives/negatives)  Labs Reviewed  BASIC METABOLIC PANEL - Abnormal; Notable for the following:    Potassium 3.4 (*)    Glucose, Bld 119 (*)    All other components within normal limits  CBC - Abnormal; Notable for the following:    WBC 18.7 (*)    All other components within normal limits  TROPONIN I  LIPASE, BLOOD   ____________________________________________  FINAL ASSESSMENT AND PLAN  Hyperemesis cannabinoids syndrome  Plan: Patient with labs and imaging as dictated above. Patient with leukocytosis secondary to the persistent vomiting that is had. This certainly appears to be hyperemesis cannabinoids syndrome. He is advised to stop smoking marijuana every day, he'll be discharged with antiemetics and encouraged to follow-up with his doctor. I have advised him and his girlfriend friend to stop using marijuana every day.   Emily Filbert, MD   Emily Filbert, MD 07/13/15 1610  Emily Filbert, MD 07/13/15 [phone]

## 2015-07-13 NOTE — ED Notes (Signed)
Report received from susan, rn.  

## 2015-07-13 NOTE — ED Notes (Signed)
Pt diaphorectic while wrapped up in blanket - given towel to wipe the sweat from his face and advised he uncover a little to keep from sweating.

## 2015-09-13 ENCOUNTER — Emergency Department: Payer: Self-pay

## 2015-09-13 ENCOUNTER — Emergency Department
Admission: EM | Admit: 2015-09-13 | Discharge: 2015-09-13 | Disposition: A | Discharge status: Home / Self Care | Payer: Self-pay | Attending: Student | Admitting: Student

## 2015-09-13 DIAGNOSIS — I10 Essential (primary) hypertension: Secondary | ICD-10-CM | POA: Insufficient documentation

## 2015-09-13 DIAGNOSIS — R1084 Generalized abdominal pain: Secondary | ICD-10-CM | POA: Insufficient documentation

## 2015-09-13 DIAGNOSIS — F17210 Nicotine dependence, cigarettes, uncomplicated: Secondary | ICD-10-CM | POA: Insufficient documentation

## 2015-09-13 DIAGNOSIS — R112 Nausea with vomiting, unspecified: Secondary | ICD-10-CM

## 2015-09-13 DIAGNOSIS — R197 Diarrhea, unspecified: Secondary | ICD-10-CM | POA: Insufficient documentation

## 2015-09-13 DIAGNOSIS — M549 Dorsalgia, unspecified: Secondary | ICD-10-CM | POA: Insufficient documentation

## 2015-09-13 DIAGNOSIS — R110 Nausea: Secondary | ICD-10-CM | POA: Insufficient documentation

## 2015-09-13 LAB — URINALYSIS COMPLETE WITH MICROSCOPIC (ARMC ONLY)
BILIRUBIN URINE: NEGATIVE
GLUCOSE, UA: 50 mg/dL — AB
LEUKOCYTES UA: NEGATIVE
NITRITE: NEGATIVE
Protein, ur: >500 mg/dL — AB
SQUAMOUS EPITHELIAL / LPF: NONE SEEN
Specific Gravity, Urine: 1.033 — ABNORMAL HIGH (ref 1.005–1.030)
WBC, UA: NONE SEEN WBC/hpf (ref 0–5)
pH: 5 (ref 5.0–8.0)

## 2015-09-13 LAB — COMPREHENSIVE METABOLIC PANEL
ALK PHOS: 64 U/L (ref 38–126)
ALT: 17 U/L (ref 17–63)
AST: 18 U/L (ref 15–41)
Albumin: 5.2 g/dL — ABNORMAL HIGH (ref 3.5–5.0)
Anion gap: 10 (ref 5–15)
BILIRUBIN TOTAL: 0.8 mg/dL (ref 0.3–1.2)
BUN: 13 mg/dL (ref 6–20)
CO2: 22 mmol/L (ref 22–32)
Calcium: 10.2 mg/dL (ref 8.9–10.3)
Chloride: 109 mmol/L (ref 101–111)
Creatinine, Ser: 1.14 mg/dL (ref 0.61–1.24)
Glucose, Bld: 141 mg/dL — ABNORMAL HIGH (ref 65–99)
Potassium: 3.7 mmol/L (ref 3.5–5.1)
Sodium: 141 mmol/L (ref 135–145)
TOTAL PROTEIN: 8.9 g/dL — AB (ref 6.5–8.1)

## 2015-09-13 LAB — CBC
HEMATOCRIT: 46 % (ref 40.0–52.0)
HEMOGLOBIN: 15.6 g/dL (ref 13.0–18.0)
MCH: 30.6 pg (ref 26.0–34.0)
MCHC: 33.9 g/dL (ref 32.0–36.0)
MCV: 90.4 fL (ref 80.0–100.0)
Platelets: 245 10*3/uL (ref 150–440)
RBC: 5.09 MIL/uL (ref 4.40–5.90)
RDW: 12.4 % (ref 11.5–14.5)
WBC: 18 10*3/uL — AB (ref 3.8–10.6)

## 2015-09-13 LAB — LIPASE, BLOOD: Lipase: 19 U/L (ref 11–51)

## 2015-09-13 MED ORDER — IOHEXOL 240 MG/ML SOLN
25.0000 mL | Freq: Once | INTRAMUSCULAR | Status: AC | PRN
  Administered 2015-09-13: 25 mL via ORAL

## 2015-09-13 MED ORDER — PROMETHAZINE HCL 12.5 MG PO TABS: 12.5000 mg | ORAL_TABLET | Freq: Four times a day (QID) | ORAL | Status: DC | PRN

## 2015-09-13 MED ORDER — PROMETHAZINE HCL 25 MG/ML IJ SOLN
12.5000 mg | Freq: Once | INTRAMUSCULAR | Status: AC
  Administered 2015-09-13: 12.5 mg via INTRAVENOUS
  Filled 2015-09-13: qty 1

## 2015-09-13 MED ORDER — HYDROMORPHONE HCL 1 MG/ML IJ SOLN
INTRAMUSCULAR | Status: AC
  Administered 2015-09-13: 1 mg via INTRAVENOUS
  Filled 2015-09-13: qty 1

## 2015-09-13 MED ORDER — HYDROMORPHONE HCL 1 MG/ML IJ SOLN
1.0000 mg | Freq: Once | INTRAMUSCULAR | Status: AC
  Administered 2015-09-13: 1 mg via INTRAVENOUS

## 2015-09-13 MED ORDER — IOHEXOL 300 MG/ML  SOLN
100.0000 mL | Freq: Once | INTRAMUSCULAR | Status: AC | PRN
  Administered 2015-09-13: 100 mL via INTRAVENOUS

## 2015-09-13 MED ORDER — HYDROCHLOROTHIAZIDE 12.5 MG PO CAPS: 12.5000 mg | ORAL_CAPSULE | Freq: Every day | ORAL | Status: DC

## 2015-09-13 MED ORDER — PROMETHAZINE HCL 25 MG/ML IJ SOLN
INTRAMUSCULAR | Status: AC
  Administered 2015-09-13: 12.5 mg via INTRAVENOUS
  Filled 2015-09-13: qty 1

## 2015-09-13 MED ORDER — PROMETHAZINE HCL 25 MG/ML IJ SOLN
12.5000 mg | Freq: Once | INTRAMUSCULAR | Status: AC
  Administered 2015-09-13: 12.5 mg via INTRAVENOUS

## 2015-09-13 NOTE — ED Provider Notes (Signed)
[facility] Emergency Department Provider Note  ____________________________________________  Time seen: Approximately 10:50 AM  I have reviewed the triage vital signs and the nursing notes.   HISTORY  Chief Complaint Emesis; Diarrhea; Flank Pain; and Abdominal Pain    HPI Brandon Allen is a 35 y.o. male with history of hypertension, cyclic vomiting syndrome secondary to cannabis use, kidney stones who presents for evaluation of one day diffuse abdominal pain, recurrent vomiting which has been nonbloody nonbilious, gradual onset, constant since onset, currently moderate to severe. He has also had several episodes of nonbloody diarrhea. No fevers. No chest pain or difficulty breathing. He feels pain "in my kidneys" bilaterally. His daughter has also been ill with similar complaints.   Past Medical History  Diagnosis Date  . HTN (hypertension) 12/20/2014  . Kidney stones   . Deafness in right ear   . Atrial fibrillation (HCC)     Dr Mariah Milling  . NSAID induced gastritis   . 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 Atrial fibrillation with rapid ventricular response (HCC) 12/20/2014  . HTN (hypertension) 12/20/2014    Past Surgical History  Procedure Laterality Date  . Cholecystectomy    . Esophagogastroduodenoscopy    . Inner ear surgery    . Esophagogastroduodenoscopy  11/2011    NSAID-induced gastritis (Dr Bluford Kaufmann)    Current Outpatient Rx  Name  Route  Sig  Dispense  Refill  . acetaminophen (TYLENOL) 500 MG tablet   Oral   Take 500 mg by mouth every 6 (six) hours as needed.         Marland Kitchen ibuprofen (ADVIL,MOTRIN) 200 MG tablet   Oral   Take 200 mg by mouth every 6 (six) hours as needed for fever, headache, mild pain, moderate pain or cramping.         . hydrochlorothiazide (MICROZIDE) 12.5 MG capsule   Oral   Take 1 capsule (12.5 mg total) by mouth daily.   15 capsule   0   .  promethazine (PHENERGAN) 12.5 MG tablet   Oral   Take 1 tablet (12.5 mg total) by mouth every 6 (six) hours as needed for nausea or vomiting.   12 tablet   0     Allergies Morphine and related  Family History  Problem Relation Age of Onset  . Heart failure Other   . Colon cancer Maternal Grandfather 64    Social History Social History  Substance Use Topics  . Smoking status: Current Every Day Smoker -- 0.50 packs/day    Types: Cigarettes    Start date: 12/19/2009  . Smokeless tobacco: Not on file  . Alcohol Use: No    Review of Systems Constitutional: No fever/chills Eyes: No visual changes. ENT: No sore throat. Cardiovascular: Denies chest pain. Respiratory: Denies shortness of breath. Gastrointestinal: + abdominal pain.  + nausea, + vomiting.  No diarrhea.  No constipation. Genitourinary: Negative for dysuria. Musculoskeletal: Positive for back pain. Skin: Negative for rash. Neurological: Negative for headaches, focal weakness or numbness.  10-point ROS otherwise negative.  ____________________________________________   PHYSICAL EXAM:  VITAL SIGNS: ED Triage Vitals  Enc Vitals Group     BP 09/13/15 0711 151/94 mmHg     Pulse Rate 09/13/15 0711 104     Resp 09/13/15 0711 20     Temp 09/13/15 0711 98.2 F (36.8 C)     Temp Source 09/13/15 0711  Oral     SpO2 09/13/15 0711 98 %     Weight 09/13/15 0711 220 lb (99.791 kg)     Height 09/13/15 0711 6' (1.829 m)     Head Cir --      Peak Flow --      Pain Score 09/13/15 0712 10     Pain Loc --      Pain Edu? --      Excl. in GC? --     Constitutional: Alert and oriented. Nontoxic-appearing in no acute distress. Eyes: Conjunctivae are normal. PERRL. EOMI. Head: Atraumatic. Nose: No congestion/rhinnorhea. Mouth/Throat: Mucous membranes are moist.  Oropharynx non-erythematous. Neck: No stridor.  Cardiovascular: Normal rate, regular rhythm. Grossly normal heart sounds.  Good peripheral  circulation. Respiratory: Normal respiratory effort.  No retractions. Lungs CTAB. Gastrointestinal: Soft diffuse tenderness throughout the abdomen, worse in the right lower quadrant.. No CVA tenderness. Genitourinary: deferred Musculoskeletal: No lower extremity tenderness nor edema.  No joint effusions. Neurologic:  Normal speech and language. No gross focal neurologic deficits are appreciated.  Skin:  Skin is warm, dry and intact. No rash noted. Psychiatric: Mood and affect are normal. Speech and behavior are normal.  ____________________________________________   LABS (all labs ordered are listed, but only abnormal results are displayed)  Labs Reviewed  COMPREHENSIVE METABOLIC PANEL - Abnormal; Notable for the following:    Glucose, Bld 141 (*)    Total Protein 8.9 (*)    Albumin 5.2 (*)    All other components within normal limits  CBC - Abnormal; Notable for the following:    WBC 18.0 (*)    All other components within normal limits  URINALYSIS COMPLETEWITH MICROSCOPIC ([facility] ONLY) - Abnormal; Notable for the following:    Color, Urine YELLOW (*)    APPearance TURBID (*)    Glucose, UA 50 (*)    Ketones, ur 1+ (*)    Specific Gravity, Urine 1.033 (*)    Hgb urine dipstick 2+ (*)    Protein, ur >500 (*)    Bacteria, UA RARE (*)    All other components within normal limits  LIPASE, BLOOD   ____________________________________________  EKG  none ____________________________________________  RADIOLOGY  CT abdomen and pelvis IMPRESSION: Gallbladder absent.  No bowel obstruction. No mesenteric thickening. No abscess. Appendix appears normal.  No renal or ureteral calculus. No hydronephrosis.  A cause for patient's symptoms has not been established with this study.  ____________________________________________   PROCEDURES  Procedure(s) performed: None  Critical Care performed: No  ____________________________________________   INITIAL IMPRESSION /  ASSESSMENT AND PLAN / ED COURSE  Pertinent labs & imaging results that were available during my care of the patient were reviewed by me and considered in my medical decision making (see chart for details).  Brandon Allen is a 35 y.o. male with history of hypertension, cyclic vomiting syndrome secondary to cannabis use, kidney stones who presents for evaluation of one day diffuse abdominal pain and vomiting. On exam, he is nontoxic appearing and in no acute distress. He is afebrile. He is hypertensive but he has a history of hypertension and does not appear to be aching hypertension meds currently. He does have tenderness to palpation throughout the abdomen, worse in the right lower quadrant. Plan for screening labs, urinalysis, CT of the abdomen and pelvis.  ----------------------------------------- 1:09 PM on 09/13/2015 -----------------------------------------  CT scan shows no acute pathology. Labs reviewed. CBC is notable for leukocytosis at 18,000 however this appears chronic on chart review. Normal CMP  and lipase. Urinalysis with numerous red blood cells, no kidney stone noted on CT. I discussed with him that his abdominal pain, nausea vomiting and diarrhea may be secondary to viral illness given that his daughter was ill with similar symptoms. We will discharge with symptomatic care, HCTZ for blood pressure control as well as close follow up with primary care doctor for reevaluation of microhematuria. We discussed return precautions and he is comfortable with the discharge plan. DC home. ____________________________________________   FINAL CLINICAL IMPRESSION(S) / ED DIAGNOSES  Final diagnoses:  Generalized abdominal pain  Non-intractable vomiting with nausea, vomiting of unspecified type  Essential hypertension      Gayla Doss, MD 09/13/15 [phone]

## 2015-09-13 NOTE — ED Notes (Signed)
Pt unable to keep down oral contrast, vomiting first bottle.  Pt attempted to drink second bottle, reports abdominal cramping.  MD notified.  CT notified to scan patient without oral contrast per Dr. Inocencio Homes.

## 2015-09-13 NOTE — ED Notes (Signed)
Pt arrives from home with reports of nausea and vomiting since 1830 last pm  Pt also reports diarrhea x2  10/10 flank pain

## 2015-09-13 NOTE — Discharge Instructions (Signed)
You were seen in the emergency department for abdominal pain, nausea, vomiting and diarrhea which may be due to a virus. You're CT scan was unremarkable. Your blood pressure was elevated today. Start taking HCTZ and follow-up with a primary care doctor within the next 1-2 days for reevaluation. Additionally, there is a small amount of blood in your urine, the cause of this is unclear but this also needs to be rechecked by primary care doctor within 1 week. Return immediately to the emergency department if you develop severe or worsening abdominal pain, recurrent vomiting, blood in vomit or stool, fevers, chest pain, difficulty breathing, numbness, weakness, vision change, or for any other concerns.

## 2016-01-25 ENCOUNTER — Other Ambulatory Visit: Payer: Self-pay

## 2016-01-25 ENCOUNTER — Observation Stay
Admission: EM | Admit: 2016-01-25 | Discharge: 2016-01-26 | Disposition: A | Discharge status: Home / Self Care | Payer: Self-pay | Attending: Internal Medicine | Admitting: Internal Medicine

## 2016-01-25 ENCOUNTER — Emergency Department: Payer: Self-pay

## 2016-01-25 ENCOUNTER — Encounter: Payer: Self-pay | Admitting: *Deleted

## 2016-01-25 DIAGNOSIS — F17210 Nicotine dependence, cigarettes, uncomplicated: Secondary | ICD-10-CM | POA: Insufficient documentation

## 2016-01-25 DIAGNOSIS — Z87442 Personal history of urinary calculi: Secondary | ICD-10-CM | POA: Insufficient documentation

## 2016-01-25 DIAGNOSIS — R1115 Cyclical vomiting syndrome unrelated to migraine: Secondary | ICD-10-CM | POA: Diagnosis present

## 2016-01-25 DIAGNOSIS — Z9049 Acquired absence of other specified parts of digestive tract: Secondary | ICD-10-CM | POA: Insufficient documentation

## 2016-01-25 DIAGNOSIS — F1210 Cannabis abuse, uncomplicated: Secondary | ICD-10-CM

## 2016-01-25 DIAGNOSIS — R739 Hyperglycemia, unspecified: Secondary | ICD-10-CM

## 2016-01-25 DIAGNOSIS — H9191 Unspecified hearing loss, right ear: Secondary | ICD-10-CM | POA: Insufficient documentation

## 2016-01-25 DIAGNOSIS — R112 Nausea with vomiting, unspecified: Principal | ICD-10-CM | POA: Insufficient documentation

## 2016-01-25 DIAGNOSIS — I4891 Unspecified atrial fibrillation: Secondary | ICD-10-CM | POA: Insufficient documentation

## 2016-01-25 DIAGNOSIS — R1084 Generalized abdominal pain: Secondary | ICD-10-CM

## 2016-01-25 DIAGNOSIS — F1110 Opioid abuse, uncomplicated: Secondary | ICD-10-CM | POA: Insufficient documentation

## 2016-01-25 DIAGNOSIS — F12988 Cannabis use, unspecified with other cannabis-induced disorder: Secondary | ICD-10-CM

## 2016-01-25 DIAGNOSIS — I10 Essential (primary) hypertension: Secondary | ICD-10-CM

## 2016-01-25 DIAGNOSIS — D72829 Elevated white blood cell count, unspecified: Secondary | ICD-10-CM

## 2016-01-25 LAB — URINE DRUG SCREEN, QUALITATIVE (ARMC ONLY)
Amphetamines, Ur Screen: NOT DETECTED
BARBITURATES, UR SCREEN: NOT DETECTED
BENZODIAZEPINE, UR SCRN: NOT DETECTED
CANNABINOID 50 NG, UR ~~LOC~~: POSITIVE — AB
Cocaine Metabolite,Ur ~~LOC~~: NOT DETECTED
MDMA (Ecstasy)Ur Screen: NOT DETECTED
METHADONE SCREEN, URINE: NOT DETECTED
Opiate, Ur Screen: POSITIVE — AB
Phencyclidine (PCP) Ur S: NOT DETECTED
TRICYCLIC, UR SCREEN: NOT DETECTED

## 2016-01-25 LAB — CBC
HEMATOCRIT: 47.5 % (ref 40.0–52.0)
Hemoglobin: 16.4 g/dL (ref 13.0–18.0)
MCH: 31.1 pg (ref 26.0–34.0)
MCHC: 34.4 g/dL (ref 32.0–36.0)
MCV: 90.4 fL (ref 80.0–100.0)
PLATELETS: 207 10*3/uL (ref 150–440)
RBC: 5.26 MIL/uL (ref 4.40–5.90)
RDW: 12.6 % (ref 11.5–14.5)
WBC: 13.6 10*3/uL — AB (ref 3.8–10.6)

## 2016-01-25 LAB — COMPREHENSIVE METABOLIC PANEL
ALK PHOS: 56 U/L (ref 38–126)
ALT: 17 U/L (ref 17–63)
AST: 31 U/L (ref 15–41)
Albumin: 5.1 g/dL — ABNORMAL HIGH (ref 3.5–5.0)
Anion gap: 10 (ref 5–15)
BUN: 11 mg/dL (ref 6–20)
CALCIUM: 10 mg/dL (ref 8.9–10.3)
CO2: 22 mmol/L (ref 22–32)
CREATININE: 1.14 mg/dL (ref 0.61–1.24)
Chloride: 109 mmol/L (ref 101–111)
Glucose, Bld: 113 mg/dL — ABNORMAL HIGH (ref 65–99)
Potassium: 4.2 mmol/L (ref 3.5–5.1)
Sodium: 141 mmol/L (ref 135–145)
Total Bilirubin: 2 mg/dL — ABNORMAL HIGH (ref 0.3–1.2)
Total Protein: 8.5 g/dL — ABNORMAL HIGH (ref 6.5–8.1)

## 2016-01-25 LAB — URINALYSIS COMPLETE WITH MICROSCOPIC (ARMC ONLY)
BACTERIA UA: NONE SEEN
BILIRUBIN URINE: NEGATIVE
Glucose, UA: NEGATIVE mg/dL
HGB URINE DIPSTICK: NEGATIVE
LEUKOCYTES UA: NEGATIVE
Nitrite: NEGATIVE
PH: 8 (ref 5.0–8.0)
PROTEIN: 30 mg/dL — AB
SQUAMOUS EPITHELIAL / LPF: NONE SEEN
Specific Gravity, Urine: 1.03 (ref 1.005–1.030)

## 2016-01-25 LAB — TSH: TSH: 0.596 u[IU]/mL (ref 0.350–4.500)

## 2016-01-25 LAB — LIPASE, BLOOD: Lipase: 24 U/L (ref 11–51)

## 2016-01-25 LAB — TROPONIN I

## 2016-01-25 MED ORDER — ONDANSETRON HCL 4 MG/2ML IJ SOLN: 4.0000 mg | Freq: Four times a day (QID) | INTRAMUSCULAR | Status: DC | PRN

## 2016-01-25 MED ORDER — HYDROCHLOROTHIAZIDE 12.5 MG PO CAPS
12.5000 mg | ORAL_CAPSULE | Freq: Every day | ORAL | Status: DC
  Administered 2016-01-26: 12.5 mg via ORAL
  Filled 2016-01-25: qty 1

## 2016-01-25 MED ORDER — PANTOPRAZOLE SODIUM 40 MG PO TBEC
40.0000 mg | DELAYED_RELEASE_TABLET | Freq: Every day | ORAL | Status: DC
  Administered 2016-01-26: 40 mg via ORAL
  Filled 2016-01-25: qty 1

## 2016-01-25 MED ORDER — HYDROMORPHONE HCL 1 MG/ML IJ SOLN
0.5000 mg | INTRAMUSCULAR | Status: DC | PRN
  Administered 2016-01-25 – 2016-01-26 (×2): 0.5 mg via INTRAVENOUS
  Filled 2016-01-25 (×2): qty 1

## 2016-01-25 MED ORDER — DIATRIZOATE MEGLUMINE & SODIUM 66-10 % PO SOLN: 15.0000 mL | Freq: Once | ORAL | Status: DC

## 2016-01-25 MED ORDER — HYDRALAZINE HCL 20 MG/ML IJ SOLN
10.0000 mg | Freq: Once | INTRAMUSCULAR | Status: AC
  Administered 2016-01-25: 10 mg via INTRAVENOUS
  Filled 2016-01-25: qty 1

## 2016-01-25 MED ORDER — HYDROMORPHONE HCL 1 MG/ML IJ SOLN
1.0000 mg | Freq: Once | INTRAMUSCULAR | Status: AC
  Administered 2016-01-25: 1 mg via INTRAVENOUS
  Filled 2016-01-25: qty 1

## 2016-01-25 MED ORDER — ACETAMINOPHEN 325 MG PO TABS: 650.0000 mg | ORAL_TABLET | Freq: Four times a day (QID) | ORAL | Status: DC | PRN

## 2016-01-25 MED ORDER — IOPAMIDOL (ISOVUE-300) INJECTION 61%
100.0000 mL | Freq: Once | INTRAVENOUS | Status: AC | PRN
  Administered 2016-01-25: 100 mL via INTRAVENOUS

## 2016-01-25 MED ORDER — ONDANSETRON HCL 4 MG PO TABS: 4.0000 mg | ORAL_TABLET | Freq: Four times a day (QID) | ORAL | Status: DC | PRN

## 2016-01-25 MED ORDER — PNEUMOCOCCAL VAC POLYVALENT 25 MCG/0.5ML IJ INJ: 0.5000 mL | INJECTION | INTRAMUSCULAR | Status: DC

## 2016-01-25 MED ORDER — ACETAMINOPHEN 650 MG RE SUPP: 650.0000 mg | Freq: Four times a day (QID) | RECTAL | Status: DC | PRN

## 2016-01-25 MED ORDER — DOCUSATE SODIUM 100 MG PO CAPS
100.0000 mg | ORAL_CAPSULE | Freq: Two times a day (BID) | ORAL | Status: DC
  Administered 2016-01-26: 100 mg via ORAL
  Filled 2016-01-25 (×2): qty 1

## 2016-01-25 MED ORDER — ONDANSETRON HCL 4 MG/2ML IJ SOLN
4.0000 mg | Freq: Once | INTRAMUSCULAR | Status: AC | PRN
  Administered 2016-01-25: 4 mg via INTRAVENOUS
  Filled 2016-01-25: qty 2

## 2016-01-25 MED ORDER — IBUPROFEN 200 MG PO TABS
200.0000 mg | ORAL_TABLET | Freq: Four times a day (QID) | ORAL | Status: DC | PRN
  Filled 2016-01-25: qty 1

## 2016-01-25 MED ORDER — CLONIDINE HCL 0.1 MG PO TABS
ORAL_TABLET | ORAL | Status: AC
Start: 2016-01-25 — End: 2016-01-26
  Filled 2016-01-25: qty 2

## 2016-01-25 MED ORDER — PROMETHAZINE HCL 25 MG/ML IJ SOLN
12.5000 mg | Freq: Once | INTRAMUSCULAR | Status: AC
  Administered 2016-01-25: 12.5 mg via INTRAVENOUS
  Filled 2016-01-25: qty 1

## 2016-01-25 MED ORDER — SODIUM CHLORIDE 0.9 % IV SOLN
INTRAVENOUS | Status: DC
  Administered 2016-01-25 – 2016-01-26 (×2): via INTRAVENOUS

## 2016-01-25 MED ORDER — CLONIDINE HCL 0.1 MG PO TABS: 0.1000 mg | ORAL_TABLET | Freq: Four times a day (QID) | ORAL | Status: DC | PRN

## 2016-01-25 MED ORDER — METOCLOPRAMIDE HCL 5 MG/ML IJ SOLN
10.0000 mg | Freq: Once | INTRAMUSCULAR | Status: AC
  Administered 2016-01-25: 10 mg via INTRAVENOUS
  Filled 2016-01-25: qty 2

## 2016-01-25 MED ORDER — CLONIDINE HCL 0.1 MG PO TABS
0.2000 mg | ORAL_TABLET | ORAL | Status: AC
  Administered 2016-01-25: 0.2 mg via ORAL

## 2016-01-25 MED ORDER — SODIUM CHLORIDE 0.9 % IV BOLUS (SEPSIS)
1000.0000 mL | Freq: Once | INTRAVENOUS | Status: AC
  Administered 2016-01-25: 1000 mL via INTRAVENOUS

## 2016-01-25 MED ORDER — HALOPERIDOL LACTATE 5 MG/ML IJ SOLN
5.0000 mg | Freq: Once | INTRAMUSCULAR | Status: AC
  Administered 2016-01-25: 5 mg via INTRAMUSCULAR
  Filled 2016-01-25: qty 1

## 2016-01-25 MED ORDER — ONDANSETRON HCL 4 MG/2ML IJ SOLN
4.0000 mg | Freq: Once | INTRAMUSCULAR | Status: AC
  Administered 2016-01-25: 4 mg via INTRAVENOUS
  Filled 2016-01-25: qty 2

## 2016-01-25 MED ORDER — HYDRALAZINE HCL 20 MG/ML IJ SOLN
10.0000 mg | INTRAMUSCULAR | Status: DC | PRN
  Administered 2016-01-25: 20 mg via INTRAVENOUS
  Filled 2016-01-25: qty 1

## 2016-01-25 NOTE — H&P (Signed)
Brandon Allen is an 35 y.o. male.   Chief Complaint: Nausea and vomiting HPI: The patient with history of cyclical vomiting presents emergency department complaining of nausea and vomiting. Emesis has been nonbloody nonbilious. It is a dark brown color and liquid in consistency. The patient states that his symptoms began yesterday but it persisted throughout the day. He reports that he cannot keep anything down. This is happened before and was assumed to be related to his marijuana use. The patient reports that he smokes marijuana twice per week on average. He admits to some abdominal pain. He denies any change in his bowel habits. Stool has been the same color consistency and volume. Despite multiple doses of antiemetics in the emergency department the patient continued to have vomiting which prompted the emergency department staff to call for admission.  Past Medical History  Diagnosis Date  . HTN (hypertension) 12/20/2014  . Kidney stones   . Deafness in right ear   . Atrial fibrillation ([place])     Dr Rockey Situ  . NSAID induced gastritis   . 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 Cholecystectomy    . Esophagogastroduodenoscopy    . Inner ear surgery    . Esophagogastroduodenoscopy  11/2011    NSAID-induced gastritis (Dr Candace Cruise)    Family History  Problem Relation Age of Onset  . Heart failure Other   . Colon cancer Maternal Grandfather 67   Social History:  reports that he has been smoking Cigarettes.  He started smoking about 6 years ago. He has been smoking about 0.50 packs per day. He does not have any smokeless tobacco history on file. He reports that he uses illicit drugs (Marijuana) about 3 times per week. He reports that he does not drink alcohol.  Allergies:  Allergies  Allergen Reactions  . Morphine And Related Nausea And Vomiting    Medications Prior to Admission  Medication Sig Dispense Refill  .  acetaminophen (TYLENOL) 500 MG tablet Take 500 mg by mouth every 6 (six) hours as needed.    Marland Kitchen ibuprofen (ADVIL,MOTRIN) 200 MG tablet Take 200 mg by mouth every 6 (six) hours as needed for fever, headache, mild pain, moderate pain or cramping.    . promethazine (PHENERGAN) 25 MG tablet Take 25 mg by mouth every 6 (six) hours as needed. For nausea up to 7 days.    . hydrochlorothiazide (MICROZIDE) 12.5 MG capsule Take 1 capsule (12.5 mg total) by mouth daily. (Patient not taking: Reported on 01/25/2016) 15 capsule 0  . promethazine (PHENERGAN) 12.5 MG tablet Take 1 tablet (12.5 mg total) by mouth every 6 (six) hours as needed for nausea or vomiting. 12 tablet 0    Results for orders placed or performed during the hospital encounter of 01/25/16 (from the past 48 hour(s))  Lipase, blood     Status: None   Collection Time: 01/25/16  1:03 PM  Result Value Ref Range   Lipase 24 11 - 51 U/L  Comprehensive metabolic panel     Status: Abnormal   Collection Time: 01/25/16  1:03 PM  Result Value Ref Range   Sodium 141 135 - 145 mmol/L   Potassium 4.2 3.5 - 5.1 mmol/L    Comment: HEMOLYSIS AT THIS LEVEL MAY AFFECT RESULT   Chloride 109 101 - 111 mmol/L   CO2 22 22 - 32 mmol/L   Glucose, Bld 113 (H) 65 - 99 mg/dL  BUN 11 6 - 20 mg/dL   Creatinine, Ser 1.14 0.61 - 1.24 mg/dL   Calcium 10.0 8.9 - 10.3 mg/dL   Total Protein 8.5 (H) 6.5 - 8.1 g/dL   Albumin 5.1 (H) 3.5 - 5.0 g/dL   AST 31 15 - 41 U/L    Comment: HEMOLYSIS AT THIS LEVEL MAY AFFECT RESULT   ALT 17 17 - 63 U/L   Alkaline Phosphatase 56 38 - 126 U/L   Total Bilirubin 2.0 (H) 0.3 - 1.2 mg/dL    Comment: HEMOLYSIS AT THIS LEVEL MAY AFFECT RESULT   GFR calc non Af Amer >60 >60 mL/min   GFR calc Af Amer >60 >60 mL/min    Comment: (NOTE) The eGFR has been calculated using the CKD EPI equation. This calculation has not been validated in all clinical situations. eGFR's persistently <60 mL/min signify possible Chronic Kidney Disease.     Anion gap 10 5 - 15  CBC     Status: Abnormal   Collection Time: 01/25/16  1:03 PM  Result Value Ref Range   WBC 13.6 (H) 3.8 - 10.6 K/uL   RBC 5.26 4.40 - 5.90 MIL/uL   Hemoglobin 16.4 13.0 - 18.0 g/dL   HCT 47.5 40.0 - 52.0 %   MCV 90.4 80.0 - 100.0 fL   MCH 31.1 26.0 - 34.0 pg   MCHC 34.4 32.0 - 36.0 g/dL   RDW 12.6 11.5 - 14.5 %   Platelets 207 150 - 440 K/uL  Troponin I     Status: None   Collection Time: 01/25/16  1:03 PM  Result Value Ref Range   Troponin I <0.03 <0.031 ng/mL    Comment:        NO INDICATION OF MYOCARDIAL INJURY.   Urinalysis complete, with microscopic     Status: Abnormal   Collection Time: 01/25/16  5:05 PM  Result Value Ref Range   Color, Urine YELLOW (A) YELLOW   APPearance CLEAR (A) CLEAR   Glucose, UA NEGATIVE NEGATIVE mg/dL   Bilirubin Urine NEGATIVE NEGATIVE   Ketones, ur 1+ (A) NEGATIVE mg/dL   Specific Gravity, Urine 1.030 1.005 - 1.030   Hgb urine dipstick NEGATIVE NEGATIVE   pH 8.0 5.0 - 8.0   Protein, ur 30 (A) NEGATIVE mg/dL   Nitrite NEGATIVE NEGATIVE   Leukocytes, UA NEGATIVE NEGATIVE   RBC / HPF 0-5 0 - 5 RBC/hpf   WBC, UA 0-5 0 - 5 WBC/hpf   Bacteria, UA NONE SEEN NONE SEEN   Squamous Epithelial / LPF NONE SEEN NONE SEEN   Mucous PRESENT   Urine Drug Screen, Qualitative ([facility] only)     Status: Abnormal   Collection Time: 01/25/16  5:05 PM  Result Value Ref Range   Tricyclic, Ur Screen NONE DETECTED NONE DETECTED   Amphetamines, Ur Screen NONE DETECTED NONE DETECTED   MDMA (Ecstasy)Ur Screen NONE DETECTED NONE DETECTED   Cocaine Metabolite,Ur [place] NONE DETECTED NONE DETECTED   Opiate, Ur Screen POSITIVE (A) NONE DETECTED   Phencyclidine (PCP) Ur S NONE DETECTED NONE DETECTED   Cannabinoid 50 Ng, Ur [place] POSITIVE (A) NONE DETECTED   Barbiturates, Ur Screen NONE DETECTED NONE DETECTED   Benzodiazepine, Ur Scrn NONE DETECTED NONE DETECTED   Methadone Scn, Ur NONE DETECTED NONE DETECTED    Comment: (NOTE) 784  Tricyclics, urine                Cutoff 1000 ng/mL 200  Amphetamines, urine  Cutoff 1000 ng/mL 300  MDMA (Ecstasy), urine           Cutoff 500 ng/mL 400  Cocaine Metabolite, urine       Cutoff 300 ng/mL 500  Opiate, urine                   Cutoff 300 ng/mL 600  Phencyclidine (PCP), urine      Cutoff 25 ng/mL 700  Cannabinoid, urine              Cutoff 50 ng/mL 800  Barbiturates, urine             Cutoff 200 ng/mL 900  Benzodiazepine, urine           Cutoff 200 ng/mL 1000 Methadone, urine                Cutoff 300 ng/mL 1100 1200 The urine drug screen provides only a preliminary, unconfirmed 1300 analytical test result and should not be used for non-medical 1400 purposes. Clinical consideration and professional judgment should 1500 be applied to any positive drug screen result due to possible 1600 interfering substances. A more specific alternate chemical method 1700 must be used in order to obtain a confirmed analytical result.  1800 Gas chromato graphy / mass spectrometry (GC/MS) is the preferred 1900 confirmatory method.    Ct Abdomen Pelvis W Contrast  01/25/2016  CLINICAL DATA:  Cyclical vomiting syndrome, prior cholecystectomy, here with nausea, vomiting and severe abdominal pain with guarding which began 2 days ago and has worsened ; history hypertension, kidney stones, atrial fibrillation, smoker EXAM: CT ABDOMEN AND PELVIS WITH CONTRAST TECHNIQUE: Multidetector CT imaging of the abdomen and pelvis was performed using the standard protocol following bolus administration of intravenous contrast. Sagittal and coronal MPR images reconstructed from axial data set. CONTRAST:  ISOVUE-300 IOPAMIDOL (ISOVUE-300) INJECTION 61% IV. No oral contrast. COMPARISON:  09/13/2015 FINDINGS: Lower chest:  Lung bases clear. Hepatobiliary: Gallbladder surgically absent. Liver normal appearance Pancreas: Normal appearance Spleen: Normal appearance Adrenals/Urinary Tract: Adrenal glands normal appearance. Kidneys  normal appearance without mass or hydronephrosis. bladder and ureters unremarkable. Stomach/Bowel: Normal appendix. Stomach and bowel loops grossly normal appearance for exam lacking GI contrast. Vascular/Lymphatic: No adenopathy. Vascular structures unremarkable. Reproductive: N/A Other: No mass, free fluid, free air, or hernia. Musculoskeletal: Bone island LEFT femoral head. Bones otherwise normal appearance. IMPRESSION: No acute intra-abdominal or intrapelvic abnormalities. Electronically Signed   By: Ulyses Southward M.D.   On: 01/25/2016 17:26    Review of Systems  Constitutional: Positive for diaphoresis. Negative for fever and chills.  HENT: Negative for sore throat and tinnitus.   Eyes: Negative for blurred vision and redness.  Respiratory: Negative for cough and shortness of breath.   Cardiovascular: Negative for chest pain, palpitations, orthopnea and PND.  Gastrointestinal: Positive for nausea, vomiting and abdominal pain. Negative for diarrhea.  Genitourinary: Negative for dysuria, urgency and frequency.  Musculoskeletal: Negative for myalgias and joint pain.  Skin: Negative for rash.       No lesions  Neurological: Positive for headaches. Negative for speech change, focal weakness and weakness.  Endo/Heme/Allergies: Does not bruise/bleed easily.       No temperature intolerance  Psychiatric/Behavioral: Negative for depression and suicidal ideas.    Blood pressure 188/116, pulse 76, temperature 98.5 F (36.9 C), temperature source Oral, resp. rate 16, height 6' (1.829 m), weight 91.627 kg (202 lb), SpO2 99 %. Physical Exam  Nursing note and vitals reviewed. Constitutional: He is oriented  to person, place, and time. He appears well-developed and well-nourished. No distress.  HENT:  Head: Normocephalic and atraumatic.  Mouth/Throat: Oropharynx is clear and moist.  Eyes: Conjunctivae and EOM are normal. Pupils are equal, round, and reactive to light. No scleral icterus.  Neck: Normal  range of motion. Neck supple. No JVD present. No tracheal deviation present. No thyromegaly present.  Cardiovascular: Regular rhythm and normal heart sounds.  Tachycardia present.  Exam reveals no gallop and no friction rub.   No murmur heard. GI: Soft. Bowel sounds are normal. He exhibits no distension and no mass. There is tenderness. There is no rebound and no guarding.  midepigastric  Genitourinary:  Deferred  Lymphadenopathy:    He has no cervical adenopathy.  Neurological: He is alert and oriented to person, place, and time. No cranial nerve deficit.  Skin: Skin is warm. No rash noted. He is diaphoretic. No erythema.  Psychiatric: He has a normal mood and affect. His behavior is normal. Judgment and thought content normal.     Assessment/Plan This is a 35 year old male admitted for intractable nausea and vomiting. 1. Nausea and vomiting: Recurrent; intractable. Continue antiemetics. I believe that we need to explore the diagnosis of cannabinoid nausea and vomiting further. The patient does not report being a heavy user. Furthermore, he has symptoms that are not entirely consistent with the aforementioned syndrome such as diffuse sweating and elevated blood pressure area I'm concerned that he may have a carcinoid tumor. I've ordered urine metanephrines and catecholamines (will try to find out how to order urine 5-HIAA). For now, clonidine for diaphoresis and blood pressure. 2. Essential hypertension: Continue hydrochlorothiazide. Hydralazine as needed. 3. DVT prophylaxis: None as the patient will be able to really early 4. GI prophylaxis: Pantoprazole for symptomatic relief The patient is a full code. Time spent on admission was inpatient care approximately 45 minutes  Harrie Foreman, MD 01/25/2016, 9:25 PM

## 2016-01-25 NOTE — ED Provider Notes (Signed)
[facility] Emergency Department Provider Note   ____________________________________________  Time seen: Approximately 2:30 PM I have reviewed the triage vital signs and the triage nursing note.  HISTORY  Chief Complaint Emesis   Historian Patient and girlfriend  HPI Brandon Allen is a 35 y.o. male with a history of cyclical vomiting syndrome, history of cholecystectomy is here with nausea vomiting and severe abdominal pain which sounds like started coming on around Thursday and has gotten worse worse worse. He states he hasn't had anything Down including liquids for the last 24 hours. He was seen at [facility] yesterday and discharge back home. He denies following up with gastroenterology.  Abdominal pain is 10/10. He's had some black emesis, no frank blood. He denies black stools he's had a little bit of diarrhea without blood.  No fevers. He has had sweats. His urine symptoms.  He tried Phenergan since discharged from the ER yesterday and reports no help.    Past Medical History  Diagnosis Date  . HTN (hypertension) 12/20/2014  . Kidney stones   . Deafness in right ear   . Atrial fibrillation ([facility])     Dr Mariah Milling  . NSAID induced gastritis   . 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 Atrial fibrillation with rapid ventricular response ([facility]) 12/20/2014  . HTN (hypertension) 12/20/2014    Past Surgical History  Procedure Laterality Date  . Cholecystectomy    . Esophagogastroduodenoscopy    . Inner ear surgery    . Esophagogastroduodenoscopy  11/2011    NSAID-induced gastritis (Dr Bluford Kaufmann)    Current Outpatient Rx  Name  Route  Sig  Dispense  Refill  . acetaminophen (TYLENOL) 500 MG tablet   Oral   Take 500 mg by mouth every 6 (six) hours as needed.         . hydrochlorothiazide (MICROZIDE) 12.5 MG capsule   Oral   Take 1 capsule (12.5 mg total) by mouth daily.    15 capsule   0   . ibuprofen (ADVIL,MOTRIN) 200 MG tablet   Oral   Take 200 mg by mouth every 6 (six) hours as needed for fever, headache, mild pain, moderate pain or cramping.         . promethazine (PHENERGAN) 12.5 MG tablet   Oral   Take 1 tablet (12.5 mg total) by mouth every 6 (six) hours as needed for nausea or vomiting.   12 tablet   0     Allergies Morphine and related  Family History  Problem Relation Age of Onset  . Heart failure Other   . Colon cancer Maternal Grandfather 21    Social History Social History  Substance Use Topics  . Smoking status: Current Every Day Smoker -- 0.50 packs/day    Types: Cigarettes    Start date: 12/19/2009  . Smokeless tobacco: None  . Alcohol Use: No    Review of Systems  Constitutional: Negative for fever. Positive for sweats and chills Eyes: Negative for visual changes. ENT: Negative for sore throat. Cardiovascular: Negative for chest pain. Respiratory: Negative for shortness of breath. Gastrointestinal:As per HPI Genitourinary: Negative for dysuria. Musculoskeletal: Bilateral flank pain. Skin: Negative for rash. Neurological: Occasional headaches, none now 10 point Review of Systems otherwise negative ____________________________________________   PHYSICAL EXAM:  VITAL SIGNS: ED Triage Vitals  Enc Vitals Group     BP 01/25/16 1236 189/109 mmHg  Pulse Rate 01/25/16 1236 78     Resp 01/25/16 1236 18     Temp 01/25/16 1236 98 F (36.7 C)     Temp Source 01/25/16 1236 Oral     SpO2 01/25/16 1236 96 %     Weight 01/25/16 1236 215 lb (97.523 kg)     Height 01/25/16 1236 6' (1.829 m)     Head Cir --      Peak Flow --      Pain Score 01/25/16 1237 10     Pain Loc --      Pain Edu? --      Excl. in GC? --      Constitutional: Lying covered up by blankets, but beads of sweat across forehead.  Wincing in a lot of pain.  Alert and oriented.  HEENT   Head: Normocephalic and atraumatic.      Eyes:  Conjunctivae are normal. PERRL. Normal extraocular movements.      Ears:         Nose: No congestion/rhinnorhea.   Mouth/Throat: Mucous membranes are moderately dry.   Neck: No stridor. Cardiovascular/Chest: Normal rate, regular rhythm.  No murmurs, rubs, or gallops. Respiratory: Normal respiratory effort without tachypnea nor retractions. Breath sounds are clear and equal bilaterally. No wheezes/rales/rhonchi. Gastrointestinal: Diffusely tender with widespread guarding. No rebound tenderness  Genitourinary/rectal:Deferred Musculoskeletal: Nontender with normal range of motion in all extremities.  Neurologic:  Normal speech and language. No gross or focal neurologic deficits are appreciated. Skin:  Skin is warm, dry and intact. No rash noted. Psychiatric: No hallucinations  ____________________________________________   EKG I, Governor Rooksebecca Boneta Standre, MD, the attending physician have personally viewed and interpreted all ECGs.  80 bpm. Normal sinus rhythm. Narrow QRS. Normal axis. Normal ST and T-wave ____________________________________________  LABS (pertinent positives/negatives)  Labs Reviewed  COMPREHENSIVE METABOLIC PANEL - Abnormal; Notable for the following:    Glucose, Bld 113 (*)    Total Protein 8.5 (*)    Albumin 5.1 (*)    Total Bilirubin 2.0 (*)    All other components within normal limits  CBC - Abnormal; Notable for the following:    WBC 13.6 (*)    All other components within normal limits  LIPASE, BLOOD  URINALYSIS COMPLETEWITH MICROSCOPIC ([facility] ONLY)  TROPONIN I    ____________________________________________  RADIOLOGY All Xrays were viewed by me. Imaging interpreted by Radiologist.  CT abdomen and pelvis with contrast: Pending __________________________________________  PROCEDURES  Procedure(s) performed: None  Critical Care performed: None  ____________________________________________   ED COURSE / ASSESSMENT AND PLAN  Pertinent labs &  imaging results that were available during my care of the patient were reviewed by me and considered in my medical decision making (see chart for details).   This patient has a history of cyclical vomiting and what sounds like chronic abdominal pain, but this episode started on Thursday and it has been getting worse and worse. He reports not being able to keep down liquids for the last 24 hours, and now with black emesis.  He is reporting having chills and feeling chilled, but is afebrile here. He looks pretty bad on exam in terms of having diaphoresis and wincing in pain and having guarding to his abdominal exam.  He does have a little elevated white blood count, but similar to what it was yesterday and previous medical evaluations he's had elevated white blood cell count too.  I spoke at length with the patient and his girlfriend about the fact of him getting  multiple CT scans and that I would really prefer not to do a CT scan, however he status is more severe. Given that he is diaphoretic with more severe abdominal pain and guarding, unfortunately I think it is imperative to rule out acute emergency condition today. They understand the risk of CT radiation versus diagnostic need today.   [facility]30 PM. CT scan pending. Disposition depending on patient's symptoms, although I suspect the patient may need hospital admission for intractable nausea and vomiting.    CONSULTATIONS:   None  Patient / Family / Caregiver informed of clinical course, medical decision-making process, and agree with plan.    ___________________________________________   FINAL CLINICAL IMPRESSION(S) / ED DIAGNOSES   Final diagnoses:  Generalized abdominal pain              Note: This dictation was prepared with Dragon dictation. Any transcriptional errors that result from this process are unintentional   Governor Rooksebecca Malikye Reppond, MD 01/25/16 [phone]

## 2016-01-25 NOTE — ED Notes (Signed)
MD Diamond at bedside. 

## 2016-01-25 NOTE — Progress Notes (Signed)
Pt arrived on unit. Pt.'s BP is 188/116. Pt states that he has a headache, dizzy, cold, clammy, and in a lot of pain. RN gave PRN hydralazine and dilaudid. Will recheck BP in one hour. Will monitor pt closely.  Brandon Allen

## 2016-01-25 NOTE — ED Notes (Addendum)
Patient c/o epigastric pain, vomiting and bilateral flank pain. Patient states he has a history of kidney stones. Patient states [facility] for same complaint and was discharged with Phenergan which has not been effective. Patient states he has not had a BM for two days. Vomited moderate amount of bile in triage, became diaphoretic and pale.

## 2016-01-25 NOTE — ED Provider Notes (Signed)
-----------------------------------------   3:47 PM on 01/25/2016 -----------------------------------------  I would care of this patient from Dr. Shaune PollackLord at 3:40 PM pending CT of the abdomen and pelvis and final disposition. 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 He also has hypertension. He is here with abdominal pain and vomiting. CT scan is pending. We'll continue to attempt to control his symptoms, I have ordered 50 g of IM Haldol as it has been shown to be helpful with cannabis hyperemesis syndrome.  ----------------------------------------- 5:54 PM on 01/25/2016 -----------------------------------------  Patient reports that his vomiting had improved however he has had recurrence of vomiting since Haldol, again complaining of severe pain. CT of the abdomen and pelvis shows no acute intra-abdominal pathology.  Gayla DossEryka A Parnell Spieler, MD 01/26/16 [phone]

## 2016-01-26 DIAGNOSIS — D72829 Elevated white blood cell count, unspecified: Secondary | ICD-10-CM

## 2016-01-26 DIAGNOSIS — R739 Hyperglycemia, unspecified: Secondary | ICD-10-CM

## 2016-01-26 DIAGNOSIS — F1210 Cannabis abuse, uncomplicated: Secondary | ICD-10-CM

## 2016-01-26 DIAGNOSIS — I10 Essential (primary) hypertension: Secondary | ICD-10-CM

## 2016-01-26 MED ORDER — PROCHLORPERAZINE EDISYLATE 5 MG/ML IJ SOLN: 10.0000 mg | INTRAMUSCULAR | Status: DC | PRN

## 2016-01-26 MED ORDER — PANTOPRAZOLE SODIUM 20 MG PO TBEC: 20.0000 mg | DELAYED_RELEASE_TABLET | Freq: Every day | ORAL | Status: DC

## 2016-01-26 NOTE — Progress Notes (Signed)
Pt to be discharged per MD order. IV removed. Instructions reviewed with pt and all questions answered.

## 2016-01-26 NOTE — Discharge Summary (Signed)
[facility] [place] at [facility]   PATIENT NAME: Brandon Allen    MR#:  [id number]  DATE OF BIRTH:  02/22/1981  DATE OF ADMISSION:  01/25/2016 ADMITTING PHYSICIAN: Arnaldo NatalMichael S Diamond, MD  DATE OF DISCHARGE: 01/26/2016  1:20 PM  PRIMARY CARE PHYSICIAN: No PCP Per Patient     ADMISSION DIAGNOSIS:  Generalized abdominal pain [R10.84] Intractable cyclical vomiting with nausea [G43.A1] Cannabinoid hyperemesis syndrome (HCC) [F12.988]  DISCHARGE DIAGNOSIS:  Principal Problem:   Intractable nausea and vomiting Active Problems:   Cannabis abuse   Leukocytosis   Hyperglycemia   Essential hypertension   SECONDARY DIAGNOSIS:   Past Medical History  Diagnosis Date  . HTN (hypertension) 12/20/2014  . Kidney stones   . Deafness in right ear   . Atrial fibrillation (HCC)     Dr Mariah MillingGollan  . NSAID induced gastritis   . 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 Emesis was described as nonbloody. On arrival to the hospital. Patient's labs were unremarkable. CT scan of abdomen and pelvis was normal. The patient was admitted to the hospital for therapy. He received IV fluids, antiemetics, and his condition improved. His diet was advanced to regular and he was able to tolerate diet well. Urine drug screen revealed cannabis and opiates, although patient was given opiates. In emergency room due to abdominal pain. Patient postulated that he is nausea and vomiting episodes could have been related to opiates and marijuana. Discussion by problem: #1. Intractable nausea and vomiting, etiology remains unclear, resolved, now patient is able to tolerate regular diet, initiate him on PPI, he was counseled against marijuana,  opiate abuse. Patient was advised to follow up with gastroenterologist and PCP as outpatient if recurrent episodes. Nonsteroidal anti-inflammatory medications were stopped.  #2. Leukocytosis, reassess in the outpatient settings, no signs of infection, patient is not on antibiotics #3. Hyperglycemia, needs to have blood glucose level checked as outpatient, fasting, to rule out diabetes mellitus #4. Essential hypertension, poorly controlled on arrival, improved with therapy, patient was advised to resume HCTZ  DISCHARGE CONDITIONS:   Stable  CONSULTS OBTAINED:     DRUG ALLERGIES:   Allergies  Allergen Reactions  . Morphine And Related Nausea And Vomiting    DISCHARGE MEDICATIONS:   Discharge Medication List as of 01/26/2016 12:46 PM    START taking these medications   Details  pantoprazole (PROTONIX) 20 MG tablet Take 1 tablet (20 mg total) by mouth daily., Starting 01/26/2016, Until Discontinued, Normal      CONTINUE these medications which have NOT CHANGED   Details  acetaminophen (TYLENOL) 500 MG tablet Take 500 mg by mouth every 6 (six) hours as needed., Until Discontinued, Historical Med    promethazine (PHENERGAN) 25 MG tablet Take 25 mg by mouth every 6 (six) hours as needed. For nausea up to 7 days., Starting 01/24/2016, Until Fri 01/31/16, Historical Med    hydrochlorothiazide (MICROZIDE) 12.5 MG capsule Take 1 capsule (12.5 mg total) by mouth daily., Starting 09/13/2015, Until Discontinued, Print      STOP taking these medications     ibuprofen (ADVIL,MOTRIN) 200 MG tablet          DISCHARGE INSTRUCTIONS:    Patient is to follow-up with primary care  physician as outpatient  If you experience worsening of your admission symptoms, develop shortness of breath, life threatening emergency, suicidal or homicidal thoughts you must seek medical attention immediately by calling 911 or calling your MD immediately  if symptoms less severe.  You Must read complete  instructions/literature along with all the possible adverse reactions/side effects for all the Medicines you take and that have been prescribed to you. Take any new Medicines after you have completely understood and accept all the possible adverse reactions/side effects.   Please note  You were cared for by a hospitalist during your hospital stay. If you have any questions about your discharge medications or the care you received while you were in the hospital after you are discharged, you can call the unit and asked to speak with the hospitalist on call if the hospitalist that took care of you is not available. Once you are discharged, your primary care physician will handle any further medical issues. Please note that NO REFILLS for any discharge medications will be authorized once you are discharged, as it is imperative that you return to your primary care physician (or establish a relationship with a primary care physician if you do not have one) for your aftercare needs so that they can reassess your need for medications and monitor your lab values.    Today   CHIEF COMPLAINT:   Chief Complaint  Patient presents with  . Emesis    HISTORY OF PRESENT ILLNESS:  Brandon Allen  is a 35 y.o. male with a known history of cyclical vomiting , who presents to the hospital with complaints of nausea and vomiting for 1-2 days, abdominal pain. Emesis was described as nonbloody. On arrival to the hospital. Patient's labs were unremarkable. CT scan of abdomen and pelvis was normal. The patient was admitted to the hospital for therapy. He received IV fluids, antiemetics, and his condition improved. His diet was advanced to regular and he was able to tolerate diet well. Urine drug screen revealed cannabis and opiates, although patient was given opiates. In emergency room due to abdominal pain. Patient postulated that he is nausea and vomiting episodes could have been related to opiates and marijuana. Discussion  by problem: #1. Intractable nausea and vomiting, etiology remains unclear, resolved, now patient is able to tolerate regular diet, initiate him on PPI, he was counseled against marijuana, opiate abuse. Patient was advised to follow up with gastroenterologist and PCP as outpatient if recurrent episodes. Nonsteroidal anti-inflammatory medications were stopped.  #2. Leukocytosis, reassess in the outpatient settings, no signs of infection, patient is not on antibiotics #3. Hyperglycemia, needs to have blood glucose level checked as outpatient, fasting, to rule out diabetes mellitus #4. Essential hypertension, poorly controlled on arrival, improved with therapy, patient was advised to resume HCTZ   VITAL SIGNS:  Blood pressure 126/82, pulse 67, temperature 98.1 F (36.7 C), temperature source Oral, resp. rate 16, height 6' (1.829 m), weight 93.985 kg (207 lb 3.2 oz), SpO2 99 %.  I/O:   Intake/Output Summary (Last 24 hours) at 01/26/16 1539 Last data filed at 01/26/16 0900  Gross per 24 hour  Intake    740 ml  Output    150 ml  Net    590 ml    PHYSICAL EXAMINATION:  GENERAL:  35 y.o.-year-old patient lying in the bed with no acute distress.  EYES: Pupils equal, round, reactive to light and accommodation. No scleral icterus. Extraocular muscles intact.  HEENT: Head atraumatic, normocephalic. Oropharynx and nasopharynx  clear.  NECK:  Supple, no jugular venous distention. No thyroid enlargement, no tenderness.  LUNGS: Normal breath sounds bilaterally, no wheezing, rales,rhonchi or crepitation. No use of accessory muscles of respiration.  CARDIOVASCULAR: S1, S2 normal. No murmurs, rubs, or gallops.  ABDOMEN: Soft, non-tender, non-distended. Bowel sounds present. No organomegaly or mass.  EXTREMITIES: No pedal edema, cyanosis, or clubbing.  NEUROLOGIC: Cranial nerves II through XII are intact. Muscle strength 5/5 in all extremities. Sensation intact. Gait not checked.  PSYCHIATRIC: The patient  is alert and oriented x 3.  SKIN: No obvious rash, lesion, or ulcer.   DATA REVIEW:   CBC  Recent Labs Lab 01/25/16 1303  WBC 13.6*  HGB 16.4  HCT 47.5  PLT 207    Chemistries   Recent Labs Lab 01/25/16 1303  NA 141  K 4.2  CL 109  CO2 22  GLUCOSE 113*  BUN 11  CREATININE 1.14  CALCIUM 10.0  AST 31  ALT 17  ALKPHOS 56  BILITOT 2.0*    Cardiac Enzymes  Recent Labs Lab 01/25/16 1303  TROPONINI <0.03    Microbiology Results  Results for orders placed or performed in visit on 11/18/11  Urine culture     Status: None   Collection Time: 11/18/11  7:42 PM  Result Value Ref Range Status   Micro Text Report   Final       SOURCE: CC    COMMENT                   NO GROWTH IN 36 HOURS   ANTIBIOTIC                                                        RADIOLOGY:  Ct Abdomen Pelvis W Contrast  01/25/2016  CLINICAL DATA:  Cyclical vomiting syndrome, prior cholecystectomy, here with nausea, vomiting and severe abdominal pain with guarding which began 2 days ago and has worsened ; history hypertension, kidney stones, atrial fibrillation, smoker EXAM: CT ABDOMEN AND PELVIS WITH CONTRAST TECHNIQUE: Multidetector CT imaging of the abdomen and pelvis was performed using the standard protocol following bolus administration of intravenous contrast. Sagittal and coronal MPR images reconstructed from axial data set. CONTRAST:  ISOVUE-300 IOPAMIDOL (ISOVUE-300) INJECTION 61% IV. No oral contrast. COMPARISON:  09/13/2015 FINDINGS: Lower chest:  Lung bases clear. Hepatobiliary: Gallbladder surgically absent. Liver normal appearance Pancreas: Normal appearance Spleen: Normal appearance Adrenals/Urinary Tract: Adrenal glands normal appearance. Kidneys normal appearance without mass or hydronephrosis. bladder and ureters unremarkable. Stomach/Bowel: Normal appendix. Stomach and bowel loops grossly normal appearance for exam lacking GI contrast. Vascular/Lymphatic: No adenopathy.  Vascular structures unremarkable. Reproductive: N/A Other: No mass, free fluid, free air, or hernia. Musculoskeletal: Bone island LEFT femoral head. Bones otherwise normal appearance. IMPRESSION: No acute intra-abdominal or intrapelvic abnormalities. Electronically Signed   By: Ulyses Southward M.D.   On: 01/25/2016 17:26    EKG:   Orders placed or performed in visit on 01/25/16  . EKG 12-Lead  . EKG 12-Lead      Management plans discussed with the patient, family and they are in agreement.  CODE STATUS:     Code Status Orders        Start     Ordered   01/25/16 2058  Full code   Continuous  01/25/16 2057    Code Status History    Date Active Date Inactive Code Status Order ID Comments User Context   12/20/2014  3:27 AM 12/21/2014  6:47 PM Full Code [id number]  Wyatt Haste, MD ED      TOTAL TIME TAKING CARE OF THIS PATIENT: 35 minutes.    Katharina Caper M.D on 01/26/2016 at 3:39 PM  Between 7am to 6pm - Pager - [phone]  After 6pm go to [email or web address] - password [email or web address] [facility]  [facility]  Office  [phone]  CC: Primary care physician; No PCP Per Patient

## 2016-06-22 ENCOUNTER — Encounter: Payer: Self-pay | Admitting: Emergency Medicine

## 2016-06-22 DIAGNOSIS — K259 Gastric ulcer, unspecified as acute or chronic, without hemorrhage or perforation: Secondary | ICD-10-CM | POA: Insufficient documentation

## 2016-06-22 DIAGNOSIS — F17210 Nicotine dependence, cigarettes, uncomplicated: Secondary | ICD-10-CM | POA: Insufficient documentation

## 2016-06-22 DIAGNOSIS — F1123 Opioid dependence with withdrawal: Principal | ICD-10-CM | POA: Insufficient documentation

## 2016-06-22 DIAGNOSIS — R112 Nausea with vomiting, unspecified: Secondary | ICD-10-CM | POA: Insufficient documentation

## 2016-06-22 DIAGNOSIS — F1210 Cannabis abuse, uncomplicated: Secondary | ICD-10-CM | POA: Insufficient documentation

## 2016-06-22 DIAGNOSIS — I4891 Unspecified atrial fibrillation: Secondary | ICD-10-CM | POA: Insufficient documentation

## 2016-06-22 DIAGNOSIS — E876 Hypokalemia: Secondary | ICD-10-CM | POA: Insufficient documentation

## 2016-06-22 DIAGNOSIS — K2971 Gastritis, unspecified, with bleeding: Secondary | ICD-10-CM | POA: Insufficient documentation

## 2016-06-22 DIAGNOSIS — I10 Essential (primary) hypertension: Secondary | ICD-10-CM | POA: Insufficient documentation

## 2016-06-22 LAB — COMPREHENSIVE METABOLIC PANEL
ALT: 22 U/L (ref 17–63)
AST: 26 U/L (ref 15–41)
Albumin: 5.1 g/dL — ABNORMAL HIGH (ref 3.5–5.0)
Alkaline Phosphatase: 59 U/L (ref 38–126)
Anion gap: 12 (ref 5–15)
BUN: 9 mg/dL (ref 6–20)
CO2: 22 mmol/L (ref 22–32)
Calcium: 10.5 mg/dL — ABNORMAL HIGH (ref 8.9–10.3)
Chloride: 107 mmol/L (ref 101–111)
Creatinine, Ser: 1.14 mg/dL (ref 0.61–1.24)
GFR calc Af Amer: >60 mL/min (ref >60)
GFR calc non Af Amer: >60 mL/min (ref >60)
Glucose, Bld: 141 mg/dL — ABNORMAL HIGH (ref 65–99)
Potassium: 3.2 mmol/L — ABNORMAL LOW (ref 3.5–5.1)
Sodium: 141 mmol/L (ref 135–145)
Total Bilirubin: 1.1 mg/dL (ref 0.3–1.2)
Total Protein: 8.9 g/dL — ABNORMAL HIGH (ref 6.5–8.1)

## 2016-06-22 LAB — CBC
HEMATOCRIT: 50.3 % (ref 40.0–52.0)
HEMOGLOBIN: 17.2 g/dL (ref 13.0–18.0)
MCH: 31.3 pg (ref 26.0–34.0)
MCHC: 34.2 g/dL (ref 32.0–36.0)
MCV: 91.4 fL (ref 80.0–100.0)
Platelets: 235 10*3/uL (ref 150–440)
RBC: 5.5 MIL/uL (ref 4.40–5.90)
RDW: 12.3 % (ref 11.5–14.5)
WBC: 17.2 10*3/uL — AB (ref 3.8–10.6)

## 2016-06-22 LAB — LIPASE, BLOOD: Lipase: 28 U/L (ref 11–51)

## 2016-06-22 MED ORDER — ONDANSETRON HCL 4 MG/2ML IJ SOLN
4.0000 mg | Freq: Once | INTRAMUSCULAR | Status: AC | PRN
  Administered 2016-06-22: 4 mg via INTRAVENOUS
  Filled 2016-06-22: qty 2

## 2016-06-22 MED ORDER — SODIUM CHLORIDE 0.9 % IV BOLUS (SEPSIS)
1000.0000 mL | Freq: Once | INTRAVENOUS | Status: AC
  Administered 2016-06-22: 1000 mL via INTRAVENOUS

## 2016-06-22 NOTE — ED Triage Notes (Signed)
Pt presents to ED via wheelchair with c/o epigastric pain accompanied by nausea and vomiting beginning tonight at 8 pm. Pt reports symptoms began [facility]. Pt diaphoretic, c/o chills. Pt reports vomited ODT Zofran tonight prior to arriving to hospital.

## 2016-06-23 ENCOUNTER — Observation Stay
Admission: EM | Admit: 2016-06-23 | Discharge: 2016-06-25 | Disposition: A | Discharge status: Home / Self Care | Payer: Self-pay | Attending: Specialist | Admitting: Specialist

## 2016-06-23 ENCOUNTER — Encounter: Payer: Self-pay | Admitting: Radiology

## 2016-06-23 ENCOUNTER — Emergency Department: Payer: Self-pay

## 2016-06-23 DIAGNOSIS — R112 Nausea with vomiting, unspecified: Secondary | ICD-10-CM

## 2016-06-23 DIAGNOSIS — R1084 Generalized abdominal pain: Secondary | ICD-10-CM

## 2016-06-23 DIAGNOSIS — R1115 Cyclical vomiting syndrome unrelated to migraine: Secondary | ICD-10-CM | POA: Diagnosis present

## 2016-06-23 LAB — URINE DRUG SCREEN, QUALITATIVE (ARMC ONLY)
AMPHETAMINES, UR SCREEN: NOT DETECTED
Barbiturates, Ur Screen: NOT DETECTED
Benzodiazepine, Ur Scrn: NOT DETECTED
COCAINE METABOLITE, UR ~~LOC~~: NOT DETECTED
Cannabinoid 50 Ng, Ur ~~LOC~~: POSITIVE — AB
MDMA (ECSTASY) UR SCREEN: NOT DETECTED
METHADONE SCREEN, URINE: NOT DETECTED
OPIATE, UR SCREEN: POSITIVE — AB
Phencyclidine (PCP) Ur S: NOT DETECTED
Tricyclic, Ur Screen: NOT DETECTED

## 2016-06-23 LAB — URINALYSIS COMPLETE WITH MICROSCOPIC (ARMC ONLY)
BACTERIA UA: NONE SEEN
Bilirubin Urine: NEGATIVE
GLUCOSE, UA: NEGATIVE mg/dL
Hgb urine dipstick: NEGATIVE
Leukocytes, UA: NEGATIVE
Nitrite: NEGATIVE
PROTEIN: NEGATIVE mg/dL
pH: 5 (ref 5.0–8.0)

## 2016-06-23 LAB — LACTIC ACID, PLASMA: Lactic Acid, Venous: 1.5 mmol/L (ref 0.5–1.9)

## 2016-06-23 LAB — MAGNESIUM: MAGNESIUM: 1.8 mg/dL (ref 1.7–2.4)

## 2016-06-23 LAB — TSH: TSH: 0.256 u[IU]/mL — ABNORMAL LOW (ref 0.350–4.500)

## 2016-06-23 LAB — TROPONIN I: Troponin I: <0.03 ng/mL (ref <0.03)

## 2016-06-23 LAB — GLUCOSE, CAPILLARY: GLUCOSE-CAPILLARY: 140 mg/dL — AB (ref 65–99)

## 2016-06-23 MED ORDER — ONDANSETRON HCL 4 MG PO TABS: 4.0000 mg | ORAL_TABLET | Freq: Four times a day (QID) | ORAL | Status: DC | PRN

## 2016-06-23 MED ORDER — PROMETHAZINE HCL 25 MG/ML IJ SOLN
12.5000 mg | Freq: Once | INTRAMUSCULAR | Status: AC
  Administered 2016-06-23: 12.5 mg via INTRAVENOUS

## 2016-06-23 MED ORDER — ACETAMINOPHEN 325 MG PO TABS: 650.0000 mg | ORAL_TABLET | Freq: Four times a day (QID) | ORAL | Status: DC | PRN

## 2016-06-23 MED ORDER — HALOPERIDOL LACTATE 5 MG/ML IJ SOLN
2.0000 mg | Freq: Once | INTRAMUSCULAR | Status: AC
  Administered 2016-06-23: 2 mg via INTRAVENOUS
  Filled 2016-06-23: qty 1

## 2016-06-23 MED ORDER — ACETAMINOPHEN 650 MG RE SUPP
650.0000 mg | Freq: Four times a day (QID) | RECTAL | Status: DC | PRN
Start: 2016-06-23 — End: 2016-06-25

## 2016-06-23 MED ORDER — ONDANSETRON HCL 4 MG/2ML IJ SOLN
4.0000 mg | Freq: Once | INTRAMUSCULAR | Status: AC
  Administered 2016-06-23: 4 mg via INTRAVENOUS

## 2016-06-23 MED ORDER — CLONIDINE HCL 0.1 MG PO TABS
0.1000 mg | ORAL_TABLET | Freq: Four times a day (QID) | ORAL | Status: DC | PRN
  Administered 2016-06-23: 11:00:00 0.1 mg via ORAL
  Filled 2016-06-23 (×2): qty 1

## 2016-06-23 MED ORDER — HYDROMORPHONE HCL 1 MG/ML IJ SOLN
1.0000 mg | Freq: Once | INTRAMUSCULAR | Status: AC
  Administered 2016-06-23: 1 mg via INTRAVENOUS

## 2016-06-23 MED ORDER — HYDROMORPHONE HCL 1 MG/ML IJ SOLN
1.0000 mg | Freq: Three times a day (TID) | INTRAMUSCULAR | Status: DC | PRN
  Administered 2016-06-23 – 2016-06-24 (×2): 1 mg via INTRAVENOUS
  Filled 2016-06-23 (×3): qty 1

## 2016-06-23 MED ORDER — HYDROMORPHONE HCL 1 MG/ML IJ SOLN
INTRAMUSCULAR | Status: AC
  Filled 2016-06-23: qty 1

## 2016-06-23 MED ORDER — PROMETHAZINE HCL 25 MG/ML IJ SOLN
INTRAMUSCULAR | Status: AC
  Filled 2016-06-23: qty 1

## 2016-06-23 MED ORDER — ENOXAPARIN SODIUM 40 MG/0.4ML ~~LOC~~ SOLN
40.0000 mg | SUBCUTANEOUS | Status: DC
  Administered 2016-06-23 – 2016-06-24 (×2): 40 mg via SUBCUTANEOUS
  Filled 2016-06-23 (×2): qty 0.4

## 2016-06-23 MED ORDER — PROMETHAZINE HCL 25 MG/ML IJ SOLN
12.5000 mg | Freq: Once | INTRAMUSCULAR | Status: AC
  Administered 2016-06-23: 12.5 mg via INTRAVENOUS
  Filled 2016-06-23: qty 1

## 2016-06-23 MED ORDER — LABETALOL HCL 5 MG/ML IV SOLN
INTRAVENOUS | Status: AC
  Administered 2016-06-23: 10 mg via INTRAVENOUS
  Filled 2016-06-23: qty 4

## 2016-06-23 MED ORDER — LABETALOL HCL 5 MG/ML IV SOLN
20.0000 mg | Freq: Once | INTRAVENOUS | Status: AC
  Administered 2016-06-23: 20 mg via INTRAVENOUS
  Filled 2016-06-23: qty 4

## 2016-06-23 MED ORDER — METOCLOPRAMIDE HCL 5 MG/ML IJ SOLN
10.0000 mg | Freq: Once | INTRAMUSCULAR | Status: AC
  Administered 2016-06-23: 10 mg via INTRAVENOUS
  Filled 2016-06-23: qty 2

## 2016-06-23 MED ORDER — ONDANSETRON HCL 4 MG/2ML IJ SOLN
INTRAMUSCULAR | Status: AC
Start: 2016-06-23 — End: 2016-06-23
  Filled 2016-06-23: qty 2

## 2016-06-23 MED ORDER — METOCLOPRAMIDE HCL 5 MG/ML IJ SOLN
10.0000 mg | Freq: Three times a day (TID) | INTRAMUSCULAR | Status: DC
  Administered 2016-06-23 (×2): 10 mg via INTRAVENOUS
  Filled 2016-06-23 (×2): qty 2

## 2016-06-23 MED ORDER — HYDROMORPHONE HCL 1 MG/ML IJ SOLN
1.0000 mg | INTRAMUSCULAR | Status: DC | PRN
  Administered 2016-06-23: 1 mg via INTRAVENOUS
  Filled 2016-06-23: qty 1

## 2016-06-23 MED ORDER — CLONIDINE HCL 0.1 MG PO TABS
0.1000 mg | ORAL_TABLET | ORAL | Status: AC
  Administered 2016-06-23: 0.1 mg via ORAL
  Filled 2016-06-23: qty 1

## 2016-06-23 MED ORDER — ONDANSETRON HCL 4 MG/2ML IJ SOLN
4.0000 mg | Freq: Four times a day (QID) | INTRAMUSCULAR | Status: DC | PRN
  Administered 2016-06-23 – 2016-06-24 (×3): 4 mg via INTRAVENOUS
  Filled 2016-06-23 (×3): qty 2

## 2016-06-23 MED ORDER — SODIUM CHLORIDE 0.9 % IV BOLUS (SEPSIS)
500.0000 mL | Freq: Once | INTRAVENOUS | Status: AC
  Administered 2016-06-23: 500 mL via INTRAVENOUS

## 2016-06-23 MED ORDER — LABETALOL HCL 5 MG/ML IV SOLN
10.0000 mg | Freq: Once | INTRAVENOUS | Status: AC
  Administered 2016-06-23: 10 mg via INTRAVENOUS

## 2016-06-23 MED ORDER — KETOROLAC TROMETHAMINE 30 MG/ML IJ SOLN
30.0000 mg | Freq: Once | INTRAMUSCULAR | Status: AC
  Administered 2016-06-23: 30 mg via INTRAVENOUS
  Filled 2016-06-23: qty 1

## 2016-06-23 MED ORDER — DOCUSATE SODIUM 100 MG PO CAPS
100.0000 mg | ORAL_CAPSULE | Freq: Two times a day (BID) | ORAL | Status: DC
  Administered 2016-06-23 – 2016-06-25 (×2): 100 mg via ORAL
  Filled 2016-06-23 (×4): qty 1

## 2016-06-23 MED ORDER — IOPAMIDOL (ISOVUE-300) INJECTION 61%
30.0000 mL | Freq: Once | INTRAVENOUS | Status: AC | PRN
  Administered 2016-06-23: 30 mL via ORAL

## 2016-06-23 MED ORDER — SODIUM CHLORIDE 0.9 % IV SOLN
INTRAVENOUS | Status: DC
  Administered 2016-06-23 – 2016-06-25 (×6): via INTRAVENOUS

## 2016-06-23 MED ORDER — POTASSIUM CHLORIDE CRYS ER 20 MEQ PO TBCR
20.0000 meq | EXTENDED_RELEASE_TABLET | Freq: Two times a day (BID) | ORAL | Status: DC
  Administered 2016-06-23 (×2): 20 meq via ORAL
  Filled 2016-06-23 (×3): qty 1

## 2016-06-23 MED ORDER — PROCHLORPERAZINE EDISYLATE 5 MG/ML IJ SOLN: 10.0000 mg | Freq: Four times a day (QID) | INTRAMUSCULAR | Status: DC | PRN

## 2016-06-23 MED ORDER — IOPAMIDOL (ISOVUE-300) INJECTION 61%
100.0000 mL | Freq: Once | INTRAVENOUS | Status: AC | PRN
  Administered 2016-06-23: 100 mL via INTRAVENOUS

## 2016-06-23 NOTE — ED Notes (Signed)
Pt ambulated to the bathroom independently with a steady gait.  

## 2016-06-23 NOTE — ED Provider Notes (Signed)
[facility] Emergency Department Provider Note   ____________________________________________   First MD Initiated Contact with Patient 06/23/16 0024     (approximate)  I have reviewed the triage vital signs and the nursing notes.   HISTORY  Chief Complaint Emesis and Abdominal Pain    HPI Brandon Allen is a 35 y.o. male who comes into the hospital with vomiting. He reports that to stop vomiting since 8 PM. He reports that it was initially yellow but now it green. The patient reports that he had just eaten hamburger from [employer]. The patient reports that he started having some abdominal pain and epigastric pain. The pain started before the vomiting. He has had this pain in the past and reports that he's had a gallstone and kidney stones. The patient reports that his gallbladder taken out 2 years ago. The patient denies any fevers, diarrhea. His last bowel movement was around 9:30 and he said it was solid. The patient rates pain a 10 out of 10 in intensity. He says currently he feels short of breath but that is due to the pain. He also relates some chest pain but states it is only when he vomits. The patient is here tonight for evaluation of these symptoms.   Past Medical History:  Diagnosis Date  . Atrial fibrillation (HCC)    Dr Mariah Milling  . 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 Deafness in right ear   . HTN (hypertension) 12/20/2014  . Kidney stones   . NSAID induced gastritis     Patient Active Problem List   Diagnosis Date Noted  . Leukocytosis 01/26/2016  . Hyperglycemia 01/26/2016  . Essential hypertension 01/26/2016  . Cannabis abuse 01/26/2016  . Intractable nausea and vomiting 01/25/2016  . Atrial fibrillation with rapid ventricular response (HCC) 12/20/2014  . HTN (hypertension) 12/20/2014    Past Surgical History:  Procedure Laterality Date  . CHOLECYSTECTOMY    . ESOPHAGOGASTRODUODENOSCOPY    .  ESOPHAGOGASTRODUODENOSCOPY  11/2011   NSAID-induced gastritis (Dr Bluford Kaufmann)  . INNER EAR SURGERY      Prior to Admission medications   Medication Sig Start Date End Date Taking? Authorizing Provider  pantoprazole (PROTONIX) 20 MG tablet Take 1 tablet (20 mg total) by mouth daily. 01/26/16  Yes Katharina Caper, MD  hydrochlorothiazide (MICROZIDE) 12.5 MG capsule Take 1 capsule (12.5 mg total) by mouth daily. Patient not taking: Reported on 01/25/2016 09/13/15   Gayla Doss, MD  promethazine (PHENERGAN) 25 MG tablet Take 25 mg by mouth every 6 (six) hours as needed. For nausea up to 7 days. 01/24/16 01/31/16  Historical Provider, MD    Allergies Morphine and related  Family History  Problem Relation Age of Onset  . Heart failure Other   . Colon cancer Maternal Grandfather 74    Social History Social History  Substance Use Topics  . Smoking status: Current Every Day Smoker    Packs/day: 0.50    Types: Cigarettes    Start date: 12/19/2009  . Smokeless tobacco: Never Used  . Alcohol use No    Review of Systems Constitutional: diaphoresis Eyes: No visual changes. ENT: No sore throat. Cardiovascular: Denies chest pain. Respiratory: Denies shortness of breath. Gastrointestinal:  abdominal pain.  nausea,  vomiting.  No diarrhea.  No constipation. Genitourinary: Negative for dysuria. Musculoskeletal: Negative for back pain. Skin: Negative for rash. Neurological: Negative for headaches, focal weakness or numbness.  10-point ROS otherwise negative.  ____________________________________________   PHYSICAL  EXAM:  VITAL SIGNS: ED Triage Vitals  Enc Vitals Group     BP 06/22/16 2246 (!) 160/117     Pulse Rate 06/22/16 2246 85     Resp 06/22/16 2246 18     Temp 06/22/16 2246 97.7 F (36.5 C)     Temp Source 06/22/16 2246 Oral     SpO2 06/22/16 2246 99 %     Weight 06/22/16 2249 220 lb (99.8 kg)     Height 06/22/16 2249 6' (1.829 m)     Head Circumference --      Peak Flow --       Pain Score 06/22/16 2249 10     Pain Loc --      Pain Edu? --      Excl. in GC? --     Constitutional: Alert and oriented. Ill appearing and in moderate distress. diaphoresis Eyes: Conjunctivae are normal. PERRL. EOMI. Head: Atraumatic. Nose: No congestion/rhinnorhea. Mouth/Throat: Mucous membranes are moist.  Oropharynx non-erythematous. Cardiovascular: Normal rate, regular rhythm. Grossly normal heart sounds.  Good peripheral circulation. Respiratory: Normal respiratory effort.  No retractions. Lungs CTAB. Gastrointestinal: Soft with diffuse tenderness to palpation. No distention. Positive bowel sounds Musculoskeletal: No lower extremity tenderness nor edema.   Neurologic:  Normal speech and language.  Skin:  Skin is warm, dry and intact.  Psychiatric: Mood and affect are normal.   ____________________________________________   LABS (all labs ordered are listed, but only abnormal results are displayed)  Labs Reviewed  COMPREHENSIVE METABOLIC PANEL - Abnormal; Notable for the following:       Result Value   Potassium 3.2 (*)    Glucose, Bld 141 (*)    Calcium 10.5 (*)    Total Protein 8.9 (*)    Albumin 5.1 (*)    All other components within normal limits  CBC - Abnormal; Notable for the following:    WBC 17.2 (*)    All other components within normal limits  URINALYSIS COMPLETEWITH MICROSCOPIC ([facility] ONLY) - Abnormal; Notable for the following:    Color, Urine YELLOW (*)    APPearance CLEAR (*)    Ketones, ur TRACE (*)    Specific Gravity, Urine >1.060 (*)    Squamous Epithelial / LPF 0-5 (*)    All other components within normal limits  GLUCOSE, CAPILLARY - Abnormal; Notable for the following:    Glucose-Capillary 140 (*)    All other components within normal limits  LIPASE, BLOOD  LACTIC ACID, PLASMA  TROPONIN I   ____________________________________________  EKG  ED ECG REPORT I, Rebecka ApleyWebster,  Caresse Sedivy P, the attending physician, personally viewed and  interpreted this ECG.   Date: 06/22/2016  EKG Time: 2259  Rate: 79  Rhythm: normal sinus rhythm  Axis: normal  Intervals:Incomplete right bundle branch block  ST&T Change: none  ____________________________________________  RADIOLOGY  CT abd and pelvis ____________________________________________   PROCEDURES  Procedure(s) performed: None  Procedures  Critical Care performed: No  ____________________________________________   INITIAL IMPRESSION / ASSESSMENT AND PLAN / ED COURSE  Pertinent labs & imaging results that were available during my care of the patient were reviewed by me and considered in my medical decision making (see chart for details).  This is a 35 year old male who comes into the hospital today with some abdominal pain and vomiting. The patient was in some weight and was found to be very diaphoretic and in a lot of discomfort. The decision was made to send the patient for a CT scan. He initially  received 2 doses of Zofran. He then stated that it does not work for him. He was given a milligram of Dilaudid as well as 12.5 mg of Benadryl. The patient reports that the pain only brought abdominal little so he received a second dose of Dilaudid. The patient then received another dose of Phenergan as well as a dose of Reglan. The patient continues to vomit in the emergency department. I will reassess the patient's CT and return.  Clinical Course as of Jun 24 611  Tue Jun 23, 2016  0300 No acute process demonstrated in the abdomen or pelvis. No evidence of bowel obstruction or inflammation. Appendix is normal.   CT Abdomen Pelvis W Contrast [AW]    Clinical Course User Index [AW] Rebecka ApleyAllison P Samuell Knoble, MD   The patient's CT scan is unremarkable. The patient though continues to have significant amounts of vomiting and retching in the emergency department. Decision was made to send the patient to the hospitalist service. The patient also received a dose of  Toradol.  ____________________________________________   FINAL CLINICAL IMPRESSION(S) / ED DIAGNOSES  Final diagnoses:  Intractable vomiting with nausea, unspecified vomiting type  Generalized abdominal pain      NEW MEDICATIONS STARTED DURING THIS VISIT:  New Prescriptions   No medications on file     Note:  This document was prepared using Dragon voice recognition software and may include unintentional dictation errors.    Rebecka ApleyAllison P Quayshawn Nin, MD 06/23/16 [phone]

## 2016-06-23 NOTE — ED Notes (Signed)
Pt removed IV stating he was cold. Pt given more blankets.

## 2016-06-23 NOTE — ED Notes (Signed)
Pt took out IV and stated he wanted to leave it was "taking too long." Raquel spoke with patient until I was able to enter room. Pt stated he wanted to wait in sub wait I told him I would bring him a recliner from sub wait. Pt was satisfied with that.

## 2016-06-23 NOTE — Progress Notes (Signed)
[facility] -  at [facility]   PATIENT NAME: Brandon Allen    MR#:  [id number]  DATE OF BIRTH:  12/01/1980  SUBJECTIVE:   Pt. Here due to abdominal pain and intractable N/V.  Suspected Opoid withdrawal.  Also noted to be severely Hypertensive.   REVIEW OF SYSTEMS:    Review of Systems  Constitutional: Negative for chills and fever.  HENT: Negative for congestion and tinnitus.   Eyes: Negative for blurred vision and double vision.  Respiratory: Negative for cough, shortness of breath and wheezing.   Cardiovascular: Negative for chest pain, orthopnea and PND.  Gastrointestinal: Positive for abdominal pain, nausea and vomiting. Negative for diarrhea.  Genitourinary: Negative for dysuria and hematuria.  Neurological: Negative for dizziness, sensory change and focal weakness.  All other systems reviewed and are negative.   Nutrition: Clear liquids Tolerating Diet: Little Tolerating PT: Ambulatory    DRUG ALLERGIES:   Allergies  Allergen Reactions  . Morphine And Related Nausea And Vomiting    VITALS:  Blood pressure (!) 156/96, pulse 77, temperature 98.1 F (36.7 C), temperature source Oral, resp. rate 20, height 6' (1.829 m), weight 94.2 kg (207 lb 11.2 oz), SpO2 100 %.  PHYSICAL EXAMINATION:   Physical Exam  GENERAL:  35 y.o.-year-old patient lying in the bed in no acute distress.  EYES: Pupils equal, round, reactive to light and accommodation. No scleral icterus. Extraocular muscles intact.  HEENT: Head atraumatic, normocephalic. Oropharynx and nasopharynx clear.  NECK:  Supple, no jugular venous distention. No thyroid enlargement, no tenderness.  LUNGS: Normal breath sounds bilaterally, no wheezing, rales, rhonchi. No use of accessory muscles of respiration.  CARDIOVASCULAR: S1, S2 normal. No murmurs, rubs, or gallops.  ABDOMEN: Soft, Tender in Lower abd, no rebound rigidity, nondistended. Bowel sounds present. No organomegaly or mass.   EXTREMITIES: No cyanosis, clubbing or edema b/l.    NEUROLOGIC: Cranial nerves II through XII are intact. No focal Motor or sensory deficits b/l.   PSYCHIATRIC: The patient is alert and oriented x 3.  SKIN: No obvious rash, lesion, or ulcer.    LABORATORY PANEL:   CBC  Recent Labs Lab 06/22/16 2311  WBC 17.2*  HGB 17.2  HCT 50.3  PLT 235   ------------------------------------------------------------------------------------------------------------------  Chemistries   Recent Labs Lab 06/22/16 2311  NA 141  K 3.2*  CL 107  CO2 22  GLUCOSE 141*  BUN 9  CREATININE 1.14  CALCIUM 10.5*  AST 26  ALT 22  ALKPHOS 59  BILITOT 1.1   ------------------------------------------------------------------------------------------------------------------  Cardiac Enzymes  Recent Labs Lab 06/23/16 0320  TROPONINI <0.03   ------------------------------------------------------------------------------------------------------------------  RADIOLOGY:  Ct Abdomen Pelvis W Contrast  Result Date: 06/23/2016 CLINICAL DATA:  Abdominal pain and tenderness beginning at 2000 hours today. Diaphoresis. EXAM: CT ABDOMEN AND PELVIS WITH CONTRAST TECHNIQUE: Multidetector CT imaging of the abdomen and pelvis was performed using the standard protocol following bolus administration of intravenous contrast. CONTRAST:  100mL ISOVUE-300 IOPAMIDOL (ISOVUE-300) INJECTION 61% COMPARISON:  01/25/2016 FINDINGS: Lower chest: Lung bases are clear. Hepatobiliary: No focal liver abnormality is seen. No gallstones, gallbladder wall thickening, or biliary dilatation. Pancreas: Unremarkable. No pancreatic ductal dilatation or surrounding inflammatory changes. Spleen: Normal in size without focal abnormality. Adrenals/Urinary Tract: Adrenal glands are unremarkable. Kidneys are normal, without renal calculi, focal lesion, or hydronephrosis. Bladder is unremarkable. Stomach/Bowel: Stomach is within normal limits.  Appendix appears normal. No evidence of bowel wall thickening, distention, or inflammatory changes. Vascular/Lymphatic: No significant vascular findings are present. No  enlarged abdominal or pelvic lymph nodes. Reproductive: Prostate is unremarkable. Other: No abdominal wall hernia or abnormality. No abdominopelvic ascites. Musculoskeletal: No acute or significant osseous findings. IMPRESSION: No acute process demonstrated in the abdomen or pelvis. No evidence of bowel obstruction or inflammation. Appendix is normal. Electronically Signed   By: Burman NievesWilliam  Stevens M.D.   On: 06/23/2016 02:54     ASSESSMENT AND PLAN:   [age] year old male with past medical history of substance abuse, previous history of intractable nausea and vomiting secondary to hyperemesis who presents to the hospital due to similar symptoms.  1. Intractable nausea vomiting-etiology unclear presently. Suspected be secondary to opioid withdrawal. -CT abdomen pelvis negative for any acute pathology. Continue supportive care with IV fluids, antiemetics. - cont. Clear liquid diet.   2. Hypokalemia-secondary to nausea and vomiting. We'll place on oral potassium supplements. -Check magnesium level.  3. Opioid withdrawal-this is suspected as patient does take Percocet on a regular basis.  - cont. Clonidine, Phenergan PRN  4. Accelerated HTN - ?? Undiagnosed HTN or related to withdrawal.  - cont. Clonidine, labetolol PRN.   Pain seeking behavior and will taper Dilaudid and will d/c IV Narcs in a.m. Tomorrow.    All the records are reviewed and case discussed with Care Management/Social Worker. Management plans discussed with the patient, family and they are in agreement.  CODE STATUS: Full code  DVT Prophylaxis: Lovenox  TOTAL TIME TAKING CARE OF THIS PATIENT: 30 minutes.   POSSIBLE D/C IN 1-2 DAYS, DEPENDING ON CLINICAL CONDITION.   [place] SirenSAINANI,Catrell Morrone J M.D on 06/23/2016 at 1:15 PM  Between 7am to 6pm - Pager -  [phone]  After 6pm go to [email or web address] - Scientist, research (life sciences)password EPAS [facility]  [facility] [place] [facility]  Office  [phone]  CC: Primary care physician; No PCP Per Patient

## 2016-06-23 NOTE — H&P (Signed)
Brandon Allen is an 35 y.o. male.   Chief Complaint: Vomiting HPI: The patient with past medical history of cyclical vomiting syndrome as well as atrial fibrillation presents emergency department complaining of abdominal pain and vomiting. The patient states that his symptoms began at approximately 10 hours prior to admission. He's had multiple episodes of nonbloody nonbilious emesis. In the emergency department the patient failed 4 doses of antiemetics. Laboratory evaluation revealed leukocytosis and vital signs were significant for elevated blood pressure. Notably the patient was afebrile throughout her emergency department evaluation.  He became diaphoretic and complaining of pain all over but particularly in his abdomen which prompted the emergency department staff to call for admission.  Past Medical History:  Diagnosis Date  . Atrial fibrillation ([place])    Dr Rockey Situ  . 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 Deafness in right ear   . HTN (hypertension) 12/20/2014  . Kidney stones   . NSAID induced gastritis     Past Surgical History:  Procedure Laterality Date  . CHOLECYSTECTOMY    . ESOPHAGOGASTRODUODENOSCOPY    . ESOPHAGOGASTRODUODENOSCOPY  11/2011   NSAID-induced gastritis (Dr Candace Cruise)  . INNER EAR SURGERY      Family History  Problem Relation Age of Onset  . Heart failure Other   . Colon cancer Maternal Grandfather 64   Social History:  reports that he has been smoking Cigarettes.  He started smoking about 6 years ago. He has been smoking about 0.50 packs per day. He has never used smokeless tobacco. He reports that he uses drugs, including Marijuana, about 3 times per week. He reports that he does not drink alcohol.  Allergies:  Allergies  Allergen Reactions  . Morphine And Related Nausea And Vomiting    Prior to Admission medications   Medication Sig Start Date End Date Taking? Authorizing Provider  pantoprazole (PROTONIX) 20 MG tablet Take 1 tablet (20 mg  total) by mouth daily. 01/26/16  Yes Theodoro Grist, MD  hydrochlorothiazide (MICROZIDE) 12.5 MG capsule Take 1 capsule (12.5 mg total) by mouth daily. Patient not taking: Reported on 01/25/2016 09/13/15   Joanne Gavel, MD  promethazine (PHENERGAN) 25 MG tablet Take 25 mg by mouth every 6 (six) hours as needed. For nausea up to 7 days. 01/24/16 01/31/16  Historical Provider, MD     Results for orders placed or performed during the hospital encounter of 06/23/16 (from the past 48 hour(s))  Lipase, blood     Status: None   Collection Time: 06/22/16 11:11 PM  Result Value Ref Range   Lipase 28 11 - 51 U/L  Comprehensive metabolic panel     Status: Abnormal   Collection Time: 06/22/16 11:11 PM  Result Value Ref Range   Sodium 141 135 - 145 mmol/L   Potassium 3.2 (L) 3.5 - 5.1 mmol/L   Chloride 107 101 - 111 mmol/L   CO2 22 22 - 32 mmol/L   Glucose, Bld 141 (H) 65 - 99 mg/dL   BUN 9 6 - 20 mg/dL   Creatinine, Ser 1.14 0.61 - 1.24 mg/dL   Calcium 10.5 (H) 8.9 - 10.3 mg/dL   Total Protein 8.9 (H) 6.5 - 8.1 g/dL   Albumin 5.1 (H) 3.5 - 5.0 g/dL   AST 26 15 - 41 U/L   ALT 22 17 - 63 U/L   Alkaline Phosphatase 59 38 - 126 U/L   Total Bilirubin 1.1 0.3 - 1.2 mg/dL   GFR calc non Af  Amer >60 >60 mL/min   GFR calc Af Amer >60 >60 mL/min    Comment: (NOTE) The eGFR has been calculated using the CKD EPI equation. This calculation has not been validated in all clinical situations. eGFR's persistently <60 mL/min signify possible Chronic Kidney Disease.    Anion gap 12 5 - 15  CBC     Status: Abnormal   Collection Time: 06/22/16 11:11 PM  Result Value Ref Range   WBC 17.2 (H) 3.8 - 10.6 K/uL   RBC 5.50 4.40 - 5.90 MIL/uL   Hemoglobin 17.2 13.0 - 18.0 g/dL   HCT 50.3 40.0 - 52.0 %   MCV 91.4 80.0 - 100.0 fL   MCH 31.3 26.0 - 34.0 pg   MCHC 34.2 32.0 - 36.0 g/dL   RDW 12.3 11.5 - 14.5 %   Platelets 235 150 - 440 K/uL  Glucose, capillary     Status: Abnormal   Collection Time: 06/23/16 12:23  AM  Result Value Ref Range   Glucose-Capillary 140 (H) 65 - 99 mg/dL  Lactic acid, plasma     Status: None   Collection Time: 06/23/16 12:59 AM  Result Value Ref Range   Lactic Acid, Venous 1.5 0.5 - 1.9 mmol/L  Urinalysis complete, with microscopic     Status: Abnormal   Collection Time: 06/23/16  3:20 AM  Result Value Ref Range   Color, Urine YELLOW (A) YELLOW   APPearance CLEAR (A) CLEAR   Glucose, UA NEGATIVE NEGATIVE mg/dL   Bilirubin Urine NEGATIVE NEGATIVE   Ketones, ur TRACE (A) NEGATIVE mg/dL   Specific Gravity, Urine >1.060 (H) 1.005 - 1.030   Hgb urine dipstick NEGATIVE NEGATIVE   pH 5.0 5.0 - 8.0   Protein, ur NEGATIVE NEGATIVE mg/dL   Nitrite NEGATIVE NEGATIVE   Leukocytes, UA NEGATIVE NEGATIVE   RBC / HPF 0-5 0 - 5 RBC/hpf   WBC, UA 0-5 0 - 5 WBC/hpf   Bacteria, UA NONE SEEN NONE SEEN   Squamous Epithelial / LPF 0-5 (A) NONE SEEN  Troponin I     Status: None   Collection Time: 06/23/16  3:20 AM  Result Value Ref Range   Troponin I <0.03 <0.03 ng/mL   Ct Abdomen Pelvis W Contrast  Result Date: 06/23/2016 CLINICAL DATA:  Abdominal pain and tenderness beginning at 2000 hours today. Diaphoresis. EXAM: CT ABDOMEN AND PELVIS WITH CONTRAST TECHNIQUE: Multidetector CT imaging of the abdomen and pelvis was performed using the standard protocol following bolus administration of intravenous contrast. CONTRAST:  119m ISOVUE-300 IOPAMIDOL (ISOVUE-300) INJECTION 61% COMPARISON:  01/25/2016 FINDINGS: Lower chest: Lung bases are clear. Hepatobiliary: No focal liver abnormality is seen. No gallstones, gallbladder wall thickening, or biliary dilatation. Pancreas: Unremarkable. No pancreatic ductal dilatation or surrounding inflammatory changes. Spleen: Normal in size without focal abnormality. Adrenals/Urinary Tract: Adrenal glands are unremarkable. Kidneys are normal, without renal calculi, focal lesion, or hydronephrosis. Bladder is unremarkable. Stomach/Bowel: Stomach is within  normal limits. Appendix appears normal. No evidence of bowel wall thickening, distention, or inflammatory changes. Vascular/Lymphatic: No significant vascular findings are present. No enlarged abdominal or pelvic lymph nodes. Reproductive: Prostate is unremarkable. Other: No abdominal wall hernia or abnormality. No abdominopelvic ascites. Musculoskeletal: No acute or significant osseous findings. IMPRESSION: No acute process demonstrated in the abdomen or pelvis. No evidence of bowel obstruction or inflammation. Appendix is normal. Electronically Signed   By: WLucienne CapersM.D.   On: 06/23/2016 02:54    Review of Systems  Constitutional: Negative for chills and  fever.  HENT: Negative for sore throat and tinnitus.   Eyes: Negative for blurred vision and redness.  Respiratory: Negative for cough and shortness of breath.   Cardiovascular: Negative for chest pain, palpitations, orthopnea and PND.  Gastrointestinal: Positive for abdominal pain, nausea and vomiting. Negative for diarrhea.  Genitourinary: Negative for dysuria, frequency and urgency.  Musculoskeletal: Negative for joint pain and myalgias.  Skin: Negative for rash.       No lesions  Neurological: Negative for speech change, focal weakness and weakness.  Endo/Heme/Allergies: Does not bruise/bleed easily.       No temperature intolerance  Psychiatric/Behavioral: Negative for depression and suicidal ideas.    Blood pressure (!) 180/116, pulse 76, temperature 97.5 F (36.4 C), temperature source Oral, resp. rate 18, height 6' (1.829 m), weight 99.8 kg (220 lb), SpO2 100 %. Physical Exam  Constitutional: He is oriented to person, place, and time. He appears well-developed and well-nourished. No distress.  HENT:  Head: Normocephalic and atraumatic.  Mouth/Throat: Oropharynx is clear and moist.  Eyes: Conjunctivae and EOM are normal. Pupils are equal, round, and reactive to light. No scleral icterus.  Neck: Normal range of motion.  Neck supple. No JVD present. No tracheal deviation present. No thyromegaly present.  Cardiovascular: Normal rate, regular rhythm and normal heart sounds.  Exam reveals no gallop and no friction rub.   No murmur heard. Respiratory: Effort normal and breath sounds normal. No respiratory distress.  GI: Soft. Bowel sounds are normal. He exhibits no distension. There is no tenderness.  Genitourinary:  Genitourinary Comments: Deferred  Musculoskeletal: Normal range of motion. He exhibits no edema.  Lymphadenopathy:    He has no cervical adenopathy.  Neurological: He is alert and oriented to person, place, and time. No cranial nerve deficit.  Skin: Skin is warm. He is diaphoretic. No erythema.  Psychiatric: He has a normal mood and affect. His behavior is normal. Judgment and thought content normal.     Assessment/Plan This is a 35 year old male admitted for intractable nausea and vomiting. 1. Intractable nausea and vomiting: Associated diaphoresis and generalized pain in conjunction with elevated blood pressure is suspicious for opiate withdrawal. The patient has been unable to provide [place] with a urine sample despite 1.5 L fluid resuscitation. I have ordered Compazine for intractable nausea and vomiting and Dilaudid for pain. 2. Hypertension: Uncontrolled; clonidine as needed for withdrawal symptoms. 3. DVT prophylaxis: Lovenox 4. GI prophylaxis: None The patient is a full code. Time spent on admission orders and patient care proximally 45 minutes  Harrie Foreman, MD 06/23/2016, 7:09 AM

## 2016-06-23 NOTE — ED Notes (Signed)
Pt c/o abdominal pain/tenderness and beginning at 2000 today. Pt very tender to palpation of abdomen.   Pt reports 1 BM today; denies diarrhea, however reports it was small/pellet shaped.

## 2016-06-23 NOTE — ED Notes (Addendum)
Paged hospitalist for prn bp meds. Pt does have admitting bed assigned but floor will not accept with current blood pressure.

## 2016-06-23 NOTE — ED Notes (Signed)
Spoke  With Consulting civil engineerCharge RN on 1c and pt blood pressure now 150/100, pt will be accepted with current blood pressure.

## 2016-06-23 NOTE — ED Notes (Signed)
Pt continues to be hypertensive. 1C will not accept pt until diastolic is under 100. edp notified and new orders for 20mg  labetalol IV recvd verbally.

## 2016-06-23 NOTE — ED Notes (Signed)
Pt given two warm blankets, no other needs expressed.

## 2016-06-23 NOTE — ED Notes (Signed)
Pt sitting on side of bed. Told pt the safest positron was for him to lay down. Pt refused stating he was more comfortable like that.

## 2016-06-24 ENCOUNTER — Encounter
Admission: EM | Disposition: A | Discharge status: Home / Self Care | Payer: Self-pay | Source: Home / Self Care | Attending: Emergency Medicine

## 2016-06-24 ENCOUNTER — Observation Stay: Payer: Self-pay | Admitting: Anesthesiology

## 2016-06-24 ENCOUNTER — Encounter: Payer: Self-pay | Admitting: Anesthesiology

## 2016-06-24 HISTORY — PX: ESOPHAGOGASTRODUODENOSCOPY: SHX5428

## 2016-06-24 LAB — COMPREHENSIVE METABOLIC PANEL
ALT: 15 U/L — AB (ref 17–63)
AST: 25 U/L (ref 15–41)
Albumin: 4.6 g/dL (ref 3.5–5.0)
Alkaline Phosphatase: 44 U/L (ref 38–126)
Anion gap: 9 (ref 5–15)
BUN: 9 mg/dL (ref 6–20)
CO2: 20 mmol/L — AB (ref 22–32)
CREATININE: 0.96 mg/dL (ref 0.61–1.24)
Calcium: 9.3 mg/dL (ref 8.9–10.3)
Chloride: 109 mmol/L (ref 101–111)
GFR calc non Af Amer: >60 mL/min (ref >60)
Glucose, Bld: 110 mg/dL — ABNORMAL HIGH (ref 65–99)
Potassium: 3.4 mmol/L — ABNORMAL LOW (ref 3.5–5.1)
SODIUM: 138 mmol/L (ref 135–145)
Total Bilirubin: 0.9 mg/dL (ref 0.3–1.2)
Total Protein: 7.7 g/dL (ref 6.5–8.1)

## 2016-06-24 LAB — HEMOGLOBIN A1C
Hgb A1c MFr Bld: 4.8 % (ref 4.8–5.6)
Mean Plasma Glucose: 91 mg/dL

## 2016-06-24 LAB — LIPASE, BLOOD: Lipase: 25 U/L (ref 11–51)

## 2016-06-24 SURGERY — EGD (ESOPHAGOGASTRODUODENOSCOPY)
Anesthesia: General

## 2016-06-24 MED ORDER — HYDRALAZINE HCL 20 MG/ML IJ SOLN
10.0000 mg | Freq: Four times a day (QID) | INTRAMUSCULAR | Status: DC | PRN
  Administered 2016-06-24: 10:00:00 10 mg via INTRAVENOUS
  Filled 2016-06-24: qty 1

## 2016-06-24 MED ORDER — HYDROMORPHONE HCL 1 MG/ML IJ SOLN
1.0000 mg | Freq: Once | INTRAMUSCULAR | Status: AC
  Administered 2016-06-24: 1 mg via INTRAVENOUS

## 2016-06-24 MED ORDER — PROMETHAZINE HCL 25 MG/ML IJ SOLN
6.2500 mg | Freq: Four times a day (QID) | INTRAMUSCULAR | Status: AC | PRN
  Administered 2016-06-24 (×2): 6.25 mg via INTRAVENOUS

## 2016-06-24 MED ORDER — LIDOCAINE HCL (CARDIAC) 20 MG/ML IV SOLN
INTRAVENOUS | Status: DC | PRN
  Administered 2016-06-24: 100 mg via INTRAVENOUS

## 2016-06-24 MED ORDER — HYDRALAZINE HCL 20 MG/ML IJ SOLN
10.0000 mg | Freq: Once | INTRAMUSCULAR | Status: AC
  Administered 2016-06-24: 10 mg via INTRAVENOUS

## 2016-06-24 MED ORDER — SUCRALFATE 1 GM/10ML PO SUSP
1.0000 g | Freq: Four times a day (QID) | ORAL | Status: DC
  Administered 2016-06-25 (×2): 1 g via ORAL
  Filled 2016-06-24 (×3): qty 10

## 2016-06-24 MED ORDER — ONDANSETRON HCL 4 MG/2ML IJ SOLN
INTRAMUSCULAR | Status: DC | PRN
  Administered 2016-06-24: 4 mg via INTRAVENOUS

## 2016-06-24 MED ORDER — PROPOFOL 10 MG/ML IV BOLUS
INTRAVENOUS | Status: DC | PRN
  Administered 2016-06-24: 200 mg via INTRAVENOUS

## 2016-06-24 MED ORDER — DEXAMETHASONE SODIUM PHOSPHATE 10 MG/ML IJ SOLN
INTRAMUSCULAR | Status: DC | PRN
  Administered 2016-06-24: 10 mg via INTRAVENOUS

## 2016-06-24 MED ORDER — LACTATED RINGERS IV SOLN: INTRAVENOUS | Status: DC | PRN

## 2016-06-24 MED ORDER — SUCCINYLCHOLINE CHLORIDE 20 MG/ML IJ SOLN
INTRAMUSCULAR | Status: DC | PRN
  Administered 2016-06-24: 100 mg via INTRAVENOUS

## 2016-06-24 MED ORDER — HYDRALAZINE HCL 20 MG/ML IJ SOLN
10.0000 mg | Freq: Once | INTRAMUSCULAR | Status: AC
  Administered 2016-06-24: 17:00:00 10 mg via INTRAVENOUS
  Filled 2016-06-24: qty 1

## 2016-06-24 MED ORDER — LORAZEPAM 2 MG/ML IJ SOLN
2.0000 mg | Freq: Once | INTRAMUSCULAR | Status: AC
  Administered 2016-06-24: 21:00:00 2 mg via INTRAVENOUS
  Filled 2016-06-24: qty 1

## 2016-06-24 MED ORDER — PANTOPRAZOLE SODIUM 40 MG IV SOLR
40.0000 mg | Freq: Two times a day (BID) | INTRAVENOUS | Status: DC
  Administered 2016-06-24 – 2016-06-25 (×2): 40 mg via INTRAVENOUS
  Filled 2016-06-24 (×3): qty 40

## 2016-06-24 MED ORDER — MIDAZOLAM HCL 2 MG/2ML IJ SOLN
INTRAMUSCULAR | Status: DC | PRN
  Administered 2016-06-24: 2 mg via INTRAVENOUS

## 2016-06-24 MED ORDER — HYDROMORPHONE HCL 1 MG/ML IJ SOLN
1.0000 mg | Freq: Once | INTRAMUSCULAR | Status: AC
  Administered 2016-06-24: 1 mg via INTRAVENOUS
  Filled 2016-06-24: qty 1

## 2016-06-24 MED ORDER — CLONIDINE HCL 0.1 MG/24HR TD PTWK
0.1000 mg | MEDICATED_PATCH | TRANSDERMAL | Status: DC
  Administered 2016-06-24: 0.1 mg via TRANSDERMAL
  Filled 2016-06-24 (×2): qty 1

## 2016-06-24 MED ORDER — ONDANSETRON HCL 4 MG PO TABS
8.0000 mg | ORAL_TABLET | Freq: Four times a day (QID) | ORAL | Status: DC
  Administered 2016-06-25: 12:00:00 8 mg via ORAL
  Filled 2016-06-24 (×2): qty 2

## 2016-06-24 MED ORDER — METOCLOPRAMIDE HCL 5 MG/ML IJ SOLN
10.0000 mg | Freq: Four times a day (QID) | INTRAMUSCULAR | Status: DC
  Administered 2016-06-24 – 2016-06-25 (×5): 10 mg via INTRAVENOUS
  Filled 2016-06-24 (×5): qty 2

## 2016-06-24 MED ORDER — SODIUM CHLORIDE 0.9 % IV SOLN
INTRAVENOUS | Status: DC
  Administered 2016-06-24: 1000 mL via INTRAVENOUS

## 2016-06-24 MED ORDER — PROMETHAZINE HCL 25 MG/ML IJ SOLN: 25.0000 mg | Freq: Four times a day (QID) | INTRAMUSCULAR | Status: DC | PRN

## 2016-06-24 MED ORDER — SODIUM CHLORIDE 0.9 % IV SOLN
8.0000 mg | Freq: Four times a day (QID) | INTRAVENOUS | Status: DC
  Administered 2016-06-24 – 2016-06-25 (×3): 8 mg via INTRAVENOUS
  Filled 2016-06-24 (×10): qty 4

## 2016-06-24 MED ORDER — PROMETHAZINE HCL 25 MG/ML IJ SOLN
25.0000 mg | Freq: Once | INTRAMUSCULAR | Status: AC
  Administered 2016-06-24: 21:00:00 25 mg via INTRAVENOUS
  Filled 2016-06-24: qty 1

## 2016-06-24 MED ORDER — PROMETHAZINE HCL 25 MG/ML IJ SOLN
25.0000 mg | Freq: Four times a day (QID) | INTRAMUSCULAR | Status: DC | PRN
  Administered 2016-06-24: 25 mg via INTRAVENOUS
  Filled 2016-06-24: qty 1

## 2016-06-24 NOTE — Progress Notes (Signed)
Phenergan given for nausea vomiting up yellowish bile approx 200

## 2016-06-24 NOTE — Progress Notes (Signed)
[facility] [place] [facility]   PATIENT NAME: Brandon Allen    MR#:  [id number]  DATE OF BIRTH:  07/04/1981  SUBJECTIVE:   Pt. Here due to abdominal pain and intractable N/V.  Suspected Opoid withdrawal.  Was doing better yesterday but this a.m. Started to having worsening N/V.    REVIEW OF SYSTEMS:    Review of Systems  Constitutional: Negative for chills and fever.  HENT: Negative for congestion and tinnitus.   Eyes: Negative for blurred vision and double vision.  Respiratory: Negative for cough, shortness of breath and wheezing.   Cardiovascular: Negative for chest pain, orthopnea and PND.  Gastrointestinal: Positive for abdominal pain, nausea and vomiting. Negative for diarrhea.  Genitourinary: Negative for dysuria and hematuria.  Neurological: Negative for dizziness, sensory change and focal weakness.  All other systems reviewed and are negative.   Nutrition: NPO Tolerating Diet: No Tolerating PT: Ambulatory    DRUG ALLERGIES:   Allergies  Allergen Reactions  . Morphine And Related Nausea And Vomiting    VITALS:  Blood pressure (!) 164/113, pulse 83, temperature 98.5 F (36.9 C), resp. rate 16, height 6' (1.829 m), weight 94.2 kg (207 lb 11.2 oz), SpO2 99 %.  PHYSICAL EXAMINATION:   Physical Exam  GENERAL:  35 y.o.-year-old patient lying in the bed in mild distress and diaphoretic.   EYES: Pupils equal, round, reactive to light and accommodation. No scleral icterus. Extraocular muscles intact.  HEENT: Head atraumatic, normocephalic. Oropharynx and nasopharynx clear.  NECK:  Supple, no jugular venous distention. No thyroid enlargement, no tenderness.  LUNGS: Normal breath sounds bilaterally, no wheezing, rales, rhonchi. No use of accessory muscles of respiration.  CARDIOVASCULAR: S1, S2 normal. No murmurs, rubs, or gallops.  ABDOMEN: Soft, Tender in Lower abd, no rebound rigidity, nondistended. Bowel sounds present. No organomegaly or mass.   EXTREMITIES: No cyanosis, clubbing or edema b/l.    NEUROLOGIC: Cranial nerves II through XII are intact. No focal Motor or sensory deficits b/l.   PSYCHIATRIC: The patient is alert and oriented x 3.  SKIN: No obvious rash, lesion, or ulcer.    LABORATORY PANEL:   CBC  Recent Labs Lab 06/22/16 2311  WBC 17.2*  HGB 17.2  HCT 50.3  PLT 235   ------------------------------------------------------------------------------------------------------------------  Chemistries   Recent Labs Lab 06/23/16 0320 06/24/16 1221  NA  --  138  K  --  3.4*  CL  --  109  CO2  --  20*  GLUCOSE  --  110*  BUN  --  9  CREATININE  --  0.96  CALCIUM  --  9.3  MG 1.8  --   AST  --  25  ALT  --  15*  ALKPHOS  --  44  BILITOT  --  0.9   ------------------------------------------------------------------------------------------------------------------  Cardiac Enzymes  Recent Labs Lab 06/23/16 0320  TROPONINI <0.03   ------------------------------------------------------------------------------------------------------------------  RADIOLOGY:  Ct Abdomen Pelvis W Contrast  Result Date: 06/23/2016 CLINICAL DATA:  Abdominal pain and tenderness beginning at 2000 hours today. Diaphoresis. EXAM: CT ABDOMEN AND PELVIS WITH CONTRAST TECHNIQUE: Multidetector CT imaging of the abdomen and pelvis was performed using the standard protocol following bolus administration of intravenous contrast. CONTRAST:  100mL ISOVUE-300 IOPAMIDOL (ISOVUE-300) INJECTION 61% COMPARISON:  01/25/2016 FINDINGS: Lower chest: Lung bases are clear. Hepatobiliary: No focal liver abnormality is seen. No gallstones, gallbladder wall thickening, or biliary dilatation. Pancreas: Unremarkable. No pancreatic ductal dilatation or surrounding inflammatory changes. Spleen: Normal in size without  focal abnormality. Adrenals/Urinary Tract: Adrenal glands are unremarkable. Kidneys are normal, without renal calculi, focal lesion, or  hydronephrosis. Bladder is unremarkable. Stomach/Bowel: Stomach is within normal limits. Appendix appears normal. No evidence of bowel wall thickening, distention, or inflammatory changes. Vascular/Lymphatic: No significant vascular findings are present. No enlarged abdominal or pelvic lymph nodes. Reproductive: Prostate is unremarkable. Other: No abdominal wall hernia or abnormality. No abdominopelvic ascites. Musculoskeletal: No acute or significant osseous findings. IMPRESSION: No acute process demonstrated in the abdomen or pelvis. No evidence of bowel obstruction or inflammation. Appendix is normal. Electronically Signed   By: Burman NievesWilliam  Stevens M.D.   On: 06/23/2016 02:54     ASSESSMENT AND PLAN:   35 year old male with past medical history of substance abuse, previous history of intractable nausea and vomiting secondary to hyperemesis who presents to the hospital due to similar symptoms.  1. Intractable nausea vomiting-etiology unclear presently. Suspected be secondary to opioid withdrawal. -CT abdomen pelvis negative for any acute pathology.  - was not improving w/ supportive care as continued to have N/V.  GI consult obtained and s/p Endoscopy showing severe Gastritis and esophogeal ulcers.  - started on PPI IV BiD and Carafate.  - cont. Supportive care w/ IV fluids, phenergan, zofran, Reglan and will monitor.  Avoid Narcotics.   2. Hypokalemia-secondary to nausea and vomiting. Will replace Intravenously if needed as pt. Is having N/V.  - Mg. Level normal.   3. Opioid withdrawal-this is suspected as patient does take Percocet on a regular basis.  - cont. Clonidine, Phenergan PRN.    4. Accelerated HTN - ?? Undiagnosed HTN or related to withdrawal.  - will placed on clonidine patch, cont. PRN Hydralazine and monitor.   Pain seeking behavior would hold off on adding any Narcotics for now.    All the records are reviewed and case discussed with Care Management/Social Worker. Management  plans discussed with the patient, family and they are in agreement.  CODE STATUS: Full code  DVT Prophylaxis: Lovenox  TOTAL TIME TAKING CARE OF THIS PATIENT: 30 minutes.   POSSIBLE D/C IN 1-2 DAYS, DEPENDING ON CLINICAL CONDITION.   [place] SirenSAINANI,Iceis Knab J M.D on 06/24/2016 at 3:20 PM  Between 7am to 6pm - Pager - [phone]  After 6pm go to [email or web address] - Scientist, research (life sciences)password EPAS [facility]  [facility] [place] [facility]  Office  [phone]  CC: Primary care physician; No PCP Per Patient

## 2016-06-24 NOTE — Progress Notes (Signed)
Blood pressure 179/117  Dr Karlton Lemonkarenz called about this   No new orders at this time

## 2016-06-24 NOTE — Anesthesia Procedure Notes (Signed)
Procedure Name: Intubation Date/Time: 06/24/2016 1:59 PM Performed by: Michaele OfferSAVAGE, Sally Reimers Pre-anesthesia Checklist: Patient identified, Patient being monitored, Timeout performed, Emergency Drugs available and Suction available Patient Re-evaluated:Patient Re-evaluated prior to inductionOxygen Delivery Method: Circle system utilized Preoxygenation: Pre-oxygenation with 100% oxygen Intubation Type: IV induction, Rapid sequence and Cricoid Pressure applied Ventilation: Mask ventilation without difficulty Laryngoscope Size: Mac and 4 Grade View: Grade I Tube type: Oral Tube size: 7.5 mm Number of attempts: 1 Airway Equipment and Method: Stylet Placement Confirmation: ETT inserted through vocal cords under direct vision,  positive ETCO2 and breath sounds checked- equal and bilateral Secured at: 23 cm Tube secured with: Tape Dental Injury: Teeth and Oropharynx as per pre-operative assessment

## 2016-06-24 NOTE — OR Nursing (Signed)
Patient being intubated by Anesthesia staff, Dr. Karlton LemonKarenz and Michaele OfferKasey Savage CRNA. Patient will be going to PACU a for recovery.

## 2016-06-24 NOTE — Consult Note (Signed)
Patient EGD with 10 differens small superficial ulcers/erosions in antrum and a large patch of severe gastritis in proximal lesser curve so will change protonix to iv 40mg  bid, cleared with pharmacy.  Also try carafate slurry.

## 2016-06-24 NOTE — Op Note (Signed)
[facility] Gastroenterology Patient Name: Brandon HuhKevin Allen Procedure Date: 06/24/2016 1:54 PM MRN: [id number] Account #: [id number] Date of Birth: 06/21/1981 Admit Type: Inpatient Age: [age] [facility] 1 Gender: Male Note Status: Finalized Procedure:            Upper GI endoscopy Indications:          Persistent vomiting of unknown cause Providers:            Scot Junobert T. Tashanna Dolin, MD Referring MD:         No Local Md, MD (Referring MD) Medicines:            General Anesthesia Complications:        No immediate complications. Procedure:            Pre-Anesthesia Assessment:                       - After reviewing the risks and benefits, the patient                        was deemed in satisfactory condition to undergo the                        procedure.                       After obtaining informed consent, the endoscope was                        passed under direct vision. Throughout the procedure,                        the patient's blood pressure, pulse, and oxygen                        saturations were monitored continuously. The Endoscope                        was introduced through the mouth, and advanced to the                        second part of duodenum. The upper GI endoscopy was                        accomplished without difficulty. The patient tolerated                        the procedure well. Findings:      The examined esophagus was normal.      Ten non-bleeding superficial gastric ulcers with no stigmata of bleeding       were found in the gastric antrum. The largest lesion was 3 mm in largest       dimension.      Localized severe inflammation characterized by erythema, friability and       granularity was found on the lesser curvature of the stomach.      Diffuse hemorrhagic mucosa with no bleeding and stigmata of recent       bleeding was found in the gastric body. Biopsies were taken with a cold       forceps for histology.  The examined duodenum was normal. Impression:           -  Normal esophagus.                       - Non-bleeding gastric ulcers with no stigmata of                        bleeding.                       - Acute gastritis.                       - Hemorrhagic gastropathy.                       - Normal examined duodenum. Recommendation:       - The findings and recommendations were discussed with                        the patient's primary physician. Procedure Code(s):    --- Professional ---                       [id number], Esophagogastroduodenoscopy, flexible, transoral;                        with biopsy, single or multiple Diagnosis Code(s):    --- Professional ---                       K25.9, Gastric ulcer, unspecified as acute or chronic,                        without hemorrhage or perforation                       K29.00, Acute gastritis without bleeding                       K92.2, Gastrointestinal hemorrhage, unspecified                       R11.10, Vomiting, unspecified CPT copyright 2016 American Medical Association. All rights reserved. The codes documented in this report are preliminary and upon coder review may  be revised to meet current compliance requirements. Scot Junobert T Keirston Saephanh, MD 06/24/2016 2:13:01 PM This report has been signed electronically. Number of Addenda: 0 Note Initiated On: 06/24/2016 1:54 PM      [facility]

## 2016-06-24 NOTE — Consult Note (Signed)
Patient seen with PA, has diaphoresis and complaints of abd pain with some retching.  Discussed case with Dr. Cherlynn KaiserSainani and will do EGD today since he has had 6 visits to ER for abd pain.  Drug use and withdrawal could be important factor.

## 2016-06-24 NOTE — Transfer of Care (Signed)
Immediate Anesthesia Transfer of Care Note  Patient: Brandon Allen  Procedure(s) Performed: Procedure(s): ESOPHAGOGASTRODUODENOSCOPY (EGD) (N/A)  Patient Location: PACU  Anesthesia Type:General  Level of Consciousness: awake, oriented and patient cooperative  Airway & Oxygen Therapy: Patient Spontanous Breathing and Patient connected to face mask oxygen  Post-op Assessment: Report given to RN, Post -op Vital signs reviewed and stable and Patient moving all extremities X 4  Post vital signs: Reviewed and stable  Last Vitals:  Vitals:   06/24/16 1201 06/24/16 1312  BP: (!) 160/100   Pulse:  78  Resp:  20  Temp:  37.3 C    Last Pain:  Vitals:   06/24/16 1312  TempSrc: Tympanic  PainSc: 8       Patients Stated Pain Goal: 3 (06/24/16 1312)  Complications: No apparent anesthesia complications

## 2016-06-24 NOTE — Progress Notes (Signed)
Dr Henrene Hawkingkephart called about blood pressure  183/111  Pt to receive catapress pad and po   No orders given for pain medication per doctor  Pt rests with eyes closed when not stimulated

## 2016-06-24 NOTE — Progress Notes (Signed)
Hydralazine given for blood pressure   Called dr Karlton Lemonkarenz  Pt states he is having abd  Pain 8 out of 10   No new orders for pain per dr Karlton Lemonkarenz    Pt eyes closed with not stimulated

## 2016-06-24 NOTE — Progress Notes (Signed)
Another dose of hydralazine given for blood pressure of 186/121   Another dose of phenergan given for nausea and vomiting of 200cc more of brown liquid

## 2016-06-24 NOTE — Anesthesia Preprocedure Evaluation (Signed)
Anesthesia Evaluation  Patient identified by MRN, date of birth, ID band Patient awake    Reviewed: Allergy & Precautions, H&P , NPO status , Patient's Chart, lab work & pertinent test results, reviewed documented beta blocker date and time   History of Anesthesia Complications Negative for: history of anesthetic complications  Airway Mallampati: II  TM Distance: >3 FB Neck ROM: full    Dental  (+) Missing, Poor Dentition   Pulmonary neg shortness of breath, neg sleep apnea, neg COPD, neg recent URI, Current Smoker,           Cardiovascular Exercise Tolerance: Good hypertension, (-) angina(-) CAD, (-) Past MI, (-) Cardiac Stents and (-) CABG + dysrhythmias Atrial Fibrillation (-) Valvular Problems/Murmurs     Neuro/Psych negative neurological ROS  negative psych ROS   GI/Hepatic Neg liver ROS, GERD  ,(+)     substance abuse  marijuana use,   Endo/Other  negative endocrine ROS  Renal/GU Renal disease (kidney stones)  negative genitourinary   Musculoskeletal   Abdominal   Peds  Hematology negative hematology ROS (+)   Anesthesia Other Findings Past Medical History: No date: Atrial fibrillation (HCC)     Comment: Dr Mariah MillingGollan No date: Cyclic vomiting syndrome     Comment: due to cannabinoid use No date: Deafness in right ear 12/20/2014: HTN (hypertension) No date: Kidney stones No date: NSAID induced gastritis  Patient denies drug use, but may be in withdrawal as noted by previous physicians who have been involved in his care.  Complains of abdominal pain, nausea, chills, and excessive perspiration.  U tox is negative for cocaine.  Reproductive/Obstetrics negative OB ROS                             Anesthesia Physical Anesthesia Plan  ASA: II  Anesthesia Plan: General   Post-op Pain Management:    Induction:   Airway Management Planned:   Additional Equipment:   Intra-op Plan:    Post-operative Plan:   Informed Consent: I have reviewed the patients History and Physical, chart, labs and discussed the procedure including the risks, benefits and alternatives for the proposed anesthesia with the patient or authorized representative who has indicated his/her understanding and acceptance.   Dental Advisory Given  Plan Discussed with: Anesthesiologist, CRNA and Surgeon  Anesthesia Plan Comments:         Anesthesia Quick Evaluation

## 2016-06-24 NOTE — Consult Note (Signed)
GI Inpatient Consult Note  Reason for Consult: Intractable nausea and vomiting   Attending Requesting Consult: Dr. Early Chars  History of Present Illness: Brandon Allen is a 35 y.o. male with a known history of Afib (2015 during admission for dehydration), CVS (d/t cannabinoid use), gastritis (EGD 2013, d/t NSAID use), HTN, HLD, and nephrolithiasis admitted with intractable nausea and vomiting.  Patient presented to the [facility] ED Monday night reporting continued vomiting since 8pm.  He described emesis as "yellow then green", nonbloody.  In the ED, labs demonstrated mild leukocytosis (17.1) and hypokalemia (3.2); rest of CBC, CMP, and lipase WNL.  UDS notable for cannabinoids and opiates.  EKG notable for NSR w/ incomplete RBBB.  CT a/p negative for acute GI abnormalities.  Patient is s/p CCY in 2014.  He failed to improve with multiple doses of antiemetics (Zofran and Phenergan) and pain control (Dilaudid and Toradol), so the hopsitalist service was called for further evaluation. Notably, patient has been evaluated in the ED and/or admitted for abdominal pain or intractable nausea/vomiting 6 times since 12/2014.    Since admission, patient states "I still don't feel well at all and am in pain".  He points to his epigastrium and periumbilical area, describing a dull pain with intermittent sharpness/stabbing.  The pain began just prior to nausea and vomiting, and has remained constant.  He last vomited green emesis about 30 minutes ago.  He notes "dark blood" in his emesis last night.  Patient also notes diaphoresis and shaking.  He admits to using Tylenol or Excedrin for pain and headaches.  He also notes "several pain medicine prescriptions at home", which were prescribed at various ED visits.  He reports taking these only as needed.  He denies NSAID use or EtOH use.  He continues to smoke cigarettes and "a small blunt" daily.  He does not take any prescription medications at home for his h/o GI issues.  He denies  frank blood in stool, but admits stools have been "darker" recently.  No fevers, chills, or change in bowel habits.   Past Medical History:  Past Medical History:  Diagnosis Date  . Atrial fibrillation (HCC)    Dr Mariah Milling  . 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 Deafness in right ear   . HTN (hypertension) 12/20/2014  . Kidney stones   . NSAID induced gastritis     Problem List: Patient Active Problem List   Diagnosis Date Noted  . Leukocytosis 01/26/2016  . Hyperglycemia 01/26/2016  . Essential hypertension 01/26/2016  . Cannabis abuse 01/26/2016  . Intractable nausea and vomiting 01/25/2016  . Atrial fibrillation with rapid ventricular response (HCC) 12/20/2014  . HTN (hypertension) 12/20/2014    Past Surgical History: Past Surgical History:  Procedure Laterality Date  . CHOLECYSTECTOMY    . ESOPHAGOGASTRODUODENOSCOPY    . ESOPHAGOGASTRODUODENOSCOPY  11/2011   NSAID-induced gastritis (Dr Bluford Kaufmann)  . INNER EAR SURGERY      Allergies: Allergies  Allergen Reactions  . Morphine And Related Nausea And Vomiting    Home Medications: Prescriptions Prior to Admission  Medication Sig Dispense Refill Last Dose  . pantoprazole (PROTONIX) 20 MG tablet Take 1 tablet (20 mg total) by mouth daily. 30 tablet 0 Past Month at Unknown time  . hydrochlorothiazide (MICROZIDE) 12.5 MG capsule Take 1 capsule (12.5 mg total) by mouth daily. (Patient not taking: Reported on 01/25/2016) 15 capsule 0 Not Taking at Unknown time  . promethazine (PHENERGAN) 25 MG tablet Take 25 mg  by mouth every 6 (six) hours as needed. For nausea up to 7 days.   Past Week at Unknown time   Home medication reconciliation was completed with the patient.   Scheduled Inpatient Medications:   . docusate sodium  100 mg Oral BID  . enoxaparin (LOVENOX) injection  40 mg Subcutaneous Q24H  . metoCLOPramide (REGLAN) injection  10 mg Intravenous Q6H    Continuous Inpatient Infusions:   . sodium chloride 150  mL/hr at 06/24/16 1000    PRN Inpatient Medications:  acetaminophen **OR** acetaminophen, cloNIDine, hydrALAZINE, ondansetron **OR** ondansetron (ZOFRAN) IV, promethazine  Family History: family history includes Colon cancer (age of onset: 2150) in his maternal grandfather; Heart failure in his other.   Social History:   reports that he has been smoking Cigarettes.  He started smoking about 6 years ago. He has been smoking about 0.50 packs per day. He has never used smokeless tobacco. He reports that he uses drugs, including Marijuana, about 3 times per week. He reports that he does not drink alcohol.   Review of Systems: Constitutional: Weight is stable.  Eyes: No changes in vision. ENT: No oral lesions, sore throat.  GI: see HPI.  Heme/Lymph: No easy bruising.  CV: No chest pain.  GU: No hematuria.  Integumentary: No rashes.  Neuro: No headaches.  Psych: No depression/anxiety.  Endocrine: No heat/cold intolerance.  Allergic/Immunologic: No urticaria.  Resp: No cough, SOB.  Musculoskeletal: No joint swelling.    Physical Examination: BP (!) 200/110 (BP Location: Left Arm)   Pulse 70   Temp 98.7 F (37.1 C) (Oral)   Resp 20   Ht 6' (1.829 m)   Wt 94.2 kg (207 lb 11.2 oz)   SpO2 100%   BMI 28.17 kg/m  Gen: alert and oriented x 4, diaphoretic, shaking, appears uncomfortable HEENT: PEERLA, EOMI, Neck: supple, no JVD or thyromegaly Chest: CTA bilaterally, no wheezes, crackles, or other adventitious sounds CV: RRR, no m/g/c/r Abd: soft, NT, diffuse pain to light stethoscope pressure and percussion throughout the abdomen, +BS in all four quadrants; no HSM, guarding, ridigity, or rebound tenderness Ext: no edema, well perfused with 2+ pulses, Skin: no rash or lesions noted Lymph: no LAD  Data: Lab Results  Component Value Date   WBC 17.2 (H) 06/22/2016   HGB 17.2 06/22/2016   HCT 50.3 06/22/2016   MCV 91.4 06/22/2016   PLT 235 06/22/2016    Recent Labs Lab  06/22/16 2311  HGB 17.2   Lab Results  Component Value Date   NA 141 06/22/2016   K 3.2 (L) 06/22/2016   CL 107 06/22/2016   CO2 22 06/22/2016   BUN 9 06/22/2016   CREATININE 1.14 06/22/2016   Lab Results  Component Value Date   ALT 22 06/22/2016   AST 26 06/22/2016   ALKPHOS 59 06/22/2016   BILITOT 1.1 06/22/2016   No results for input(s): APTT, INR, PTT in the last 168 hours.   Assessment/Plan: Mr. Early CharsSiler is a 35 y.o. male with a known history of Afib (2015 during admission for dehydration), CVS (d/t cannabinoid use), gastritis (EGD 2013, d/t NSAID use), HTN, HLD, and nephrolithiasis admitted with intractable nausea and vomiting. He has been evaluated in the ED 6 times this year for the same symptoms, with two admissions.  Work-up on prior admissions unremarkable.  Today, he is afebrile with stable vitals. Labs notable for mild leukocytosis and hypokalemia.  CT a/p very reassuring against acute abnormalities.  Patient appears diaphoretic, shaky, and uncomfortable.  During our visit, he began vomiting green emesis on two occasions.  Given the persistence of symptoms over the last year, which are unrelieved by IV antiemetics and pain control, will proceed with EGD per Dr. Mechele CollinElliott.  Patient is also likely experiencing withdrawal from opioids, which may contribute to GI symptoms as well.  We also discuss CVS, which is likely due to daily cannabinoid use.  I strongly encouraged him to accept resources for tobacco and marijuana cessation upon discharge.  Recommendations: - EGD today to exclude ulcers, gastritis, etc. - Continue IV antiemetics, pain control prn - Clear liquid diet when tolerated - Further recs per Dr. Mechele CollinElliott pending EGD results  Thank you for the consult. We will follow along with you. Please call with questions or concerns.  Burman FreestoneMichelle C Daelyn Pettaway, PA-C [facility] Phone: [phone] Pager: [phone]

## 2016-06-24 NOTE — Plan of Care (Signed)
Problem: Pain Managment: Goal: General experience of comfort will improve Outcome: Not Progressing Pt c/o epigastric pain throughout the day. Pain level 8 out of 10. Dilaudid given x2 with minimal to no relief.  MD is aware.  Problem: Physical Regulation: Goal: Ability to maintain clinical measurements within normal limits will improve Outcome: Not Progressing BP elevated during the day. BP meds given throughout the day without much improvement. IVF continues at 17150ml/hr. MD is aware. At shift change notified hospitalist of BP 190/110, 2mg  ativan IV ordered. Night RN is aware. Diaphoretic at times, but afebrile.  Problem: Fluid Volume: Goal: Ability to maintain a balanced intake and output will improve Outcome: Not Progressing Pt c/o n/v throughout the day. Green emesis. Scheduled nausea meds and prn meds given. Nausea subside before shift change. S/p EGD.  Problem: Nutrition: Goal: Adequate nutrition will be maintained Outcome: Not Progressing As above. Pt is NPO.

## 2016-06-24 NOTE — OR Nursing (Signed)
Time out was done prior to procedure start 

## 2016-06-25 DIAGNOSIS — F1221 Cannabis dependence, in remission: Secondary | ICD-10-CM

## 2016-06-25 DIAGNOSIS — R112 Nausea with vomiting, unspecified: Secondary | ICD-10-CM

## 2016-06-25 LAB — CBC
HCT: 45.6 % (ref 40.0–52.0)
HEMOGLOBIN: 15.5 g/dL (ref 13.0–18.0)
MCH: 30.9 pg (ref 26.0–34.0)
MCHC: 33.9 g/dL (ref 32.0–36.0)
MCV: 91.4 fL (ref 80.0–100.0)
PLATELETS: 190 10*3/uL (ref 150–440)
RBC: 5 MIL/uL (ref 4.40–5.90)
RDW: 12.5 % (ref 11.5–14.5)
WBC: 15.6 10*3/uL — ABNORMAL HIGH (ref 3.8–10.6)

## 2016-06-25 LAB — BASIC METABOLIC PANEL
Anion gap: 8 (ref 5–15)
BUN: 11 mg/dL (ref 6–20)
CALCIUM: 9.2 mg/dL (ref 8.9–10.3)
CO2: 23 mmol/L (ref 22–32)
CREATININE: 1.1 mg/dL (ref 0.61–1.24)
Chloride: 109 mmol/L (ref 101–111)
Glucose, Bld: 99 mg/dL (ref 65–99)
Potassium: 3.3 mmol/L — ABNORMAL LOW (ref 3.5–5.1)
SODIUM: 140 mmol/L (ref 135–145)

## 2016-06-25 MED ORDER — CLONIDINE HCL 0.1 MG PO TABS: 0.1000 mg | ORAL_TABLET | Freq: Two times a day (BID) | ORAL | Status: DC

## 2016-06-25 MED ORDER — SUCRALFATE 1 GM/10ML PO SUSP: 1.0000 g | Freq: Four times a day (QID) | ORAL | 0 refills | Status: DC

## 2016-06-25 MED ORDER — HYDROCHLOROTHIAZIDE 25 MG PO TABS
25.0000 mg | ORAL_TABLET | Freq: Every day | ORAL | Status: DC
  Administered 2016-06-25: 25 mg via ORAL
  Filled 2016-06-25: qty 1

## 2016-06-25 MED ORDER — PANTOPRAZOLE SODIUM 20 MG PO TBEC: 40.0000 mg | DELAYED_RELEASE_TABLET | Freq: Two times a day (BID) | ORAL | 1 refills | Status: DC

## 2016-06-25 NOTE — Progress Notes (Signed)
Pt had small smear of mucoid brown loose stool with specimen collected and sent to lab. Denies pain, nausea/vomiting. BP stable. Tolerating full liquid diet with pt educated to advance diet slowly and with soft/bland foods and fluid intake, advised to avoid NSAIDS. Pt states he feels ready for discharge home. Oral and written AVS instruction given with prescriptions given for protonix and carafate with stated understanding with repeat back. Calling for ride now. Ready for discharge.

## 2016-06-25 NOTE — Progress Notes (Signed)
Pt has loose/soft brown med stool this a.m. Prior to stool specimen order. Pt instructed on stool sample collection. Denies pain/nausea/vomiting. States expecting to go home today. Slept at intervals. Tolerated clear liquids well with diet advance to full liquid.

## 2016-06-25 NOTE — Progress Notes (Signed)
[facility] [place] [facility]   PATIENT NAME: Brandon Allen    MR#:  [id number]  DATE OF BIRTH:  01/09/1981  SUBJECTIVE:   Pt. Here due to abdominal pain and intractable N/V.  Due to hyperemesis from marijuana abuse. Much improved today. Status post endoscopy yesterday showing gastritis with multiple esophageal and gastric superficial ulcers.  REVIEW OF SYSTEMS:    Review of Systems  Constitutional: Negative for chills and fever.  HENT: Negative for congestion and tinnitus.   Eyes: Negative for blurred vision and double vision.  Respiratory: Negative for cough, shortness of breath and wheezing.   Cardiovascular: Negative for chest pain, orthopnea and PND.  Gastrointestinal: Positive for nausea and vomiting. Negative for abdominal pain and diarrhea.  Genitourinary: Negative for dysuria and hematuria.  Neurological: Negative for dizziness, sensory change and focal weakness.  All other systems reviewed and are negative.   Nutrition: Clear liquids Tolerating Diet: Yes Tolerating PT: Ambulatory    DRUG ALLERGIES:   Allergies  Allergen Reactions  . Morphine And Related Nausea And Vomiting    VITALS:  Blood pressure 129/82, pulse 76, temperature 98.2 F (36.8 C), temperature source Oral, resp. rate 20, height 6' (1.829 m), weight 97.8 kg (215 lb 9.6 oz), SpO2 95 %.  PHYSICAL EXAMINATION:   Physical Exam  GENERAL:  35 y.o.-year-old patient lying in the bed in mild distress and diaphoretic.   EYES: Pupils equal, round, reactive to light and accommodation. No scleral icterus. Extraocular muscles intact.  HEENT: Head atraumatic, normocephalic. Oropharynx and nasopharynx clear.  NECK:  Supple, no jugular venous distention. No thyroid enlargement, no tenderness.  LUNGS: Normal breath sounds bilaterally, no wheezing, rales, rhonchi. No use of accessory muscles of respiration.  CARDIOVASCULAR: S1, S2 normal. No murmurs, rubs, or gallops.  ABDOMEN: Soft,  nontender, no rebound rigidity, nondistended. Bowel sounds present. No organomegaly or mass.  EXTREMITIES: No cyanosis, clubbing or edema b/l.    NEUROLOGIC: Cranial nerves II through XII are intact. No focal Motor or sensory deficits b/l.   PSYCHIATRIC: The patient is alert and oriented x 3.  SKIN: No obvious rash, lesion, or ulcer.    LABORATORY PANEL:   CBC  Recent Labs Lab 06/25/16 0518  WBC 15.6*  HGB 15.5  HCT 45.6  PLT 190   ------------------------------------------------------------------------------------------------------------------  Chemistries   Recent Labs Lab 06/23/16 0320 06/24/16 1221 06/25/16 0518  NA  --  138 140  K  --  3.4* 3.3*  CL  --  109 109  CO2  --  20* 23  GLUCOSE  --  110* 99  BUN  --  9 11  CREATININE  --  0.96 1.10  CALCIUM  --  9.3 9.2  MG 1.8  --   --   AST  --  25  --   ALT  --  15*  --   ALKPHOS  --  44  --   BILITOT  --  0.9  --    ------------------------------------------------------------------------------------------------------------------  Cardiac Enzymes  Recent Labs Lab 06/23/16 0320  TROPONINI <0.03   ------------------------------------------------------------------------------------------------------------------  RADIOLOGY:  No results found.   ASSESSMENT AND PLAN:   35 year old male with past medical history of substance abuse, previous history of intractable nausea and vomiting secondary to hyperemesis who presents to the hospital due to similar symptoms.  1. Intractable nausea vomiting- due to Opioid withdrawal and Marijuana Hyperemesis.   -CT abdomen pelvis negative for any acute pathology.  - seen by GI  and s/p  Endoscopy yesterday showing severe Gastritis and esophogeal ulcers.  - improved today and cont. PPI BID and Carafate.   - will start on clear liquid and advance as tolerated.  - cont. Supportive care w/ IV fluids, phenergan, zofran, Reglan and will monitor.  Avoid Narcotics.   2.  Hypokalemia-secondary to nausea and vomiting.  - improved w/ supplementation and will monitor.   3. Opioid withdrawal-this is suspected as patient does take Percocet on a regular basis.  - cont. Clonidine, Phenergan PRN.    4. Accelerated HTN - much improved on Clonidine patch, HCTZ.  - will cont.  PRN Hydralazine and monitor.   Possible d/c home later today if tolerating PO well.   All the records are reviewed and case discussed with Care Management/Social Worker. Management plans discussed with the patient, family and they are in agreement.  CODE STATUS: Full code  DVT Prophylaxis: Lovenox  TOTAL TIME TAKING CARE OF THIS PATIENT: 30 minutes.   POSSIBLE D/C IN 1-2 DAYS, DEPENDING ON CLINICAL CONDITION.   Houston SirenSAINANI,VIVEK J M.D on 06/25/2016 at 12:25 PM  Between 7am to 6pm - Pager - [phone]  After 6pm go to [email or web address] - Scientist, research (life sciences)password [email or web address]  [facility]  Office  [phone]  CC: Primary care physician; No PCP Per Patient

## 2016-06-25 NOTE — Progress Notes (Signed)
Pt has awakened and will try full liquid diet; if he tolerated full liquid, pt will agrees with discharge home.

## 2016-06-25 NOTE — Plan of Care (Signed)
Problem: Fluid Volume: Goal: Ability to maintain a balanced intake and output will improve Outcome: Not Progressing Patient NPO this shift.

## 2016-06-25 NOTE — Progress Notes (Signed)
Jonathon Bellows MD [address] [place], [place] [address] Phone: [phone] Fax : [phone]  Brandon Allen is being followed for nausea/vomiting  Day 2 of follow up   Subjective: Feels much better today , novomiting,nausea or abdominal pain. Wants to eat and go home.    Objective: Vital signs in last 24 hours: Vitals:   06/24/16 1857 06/24/16 2023 06/25/16 0437 06/25/16 0500  BP: (!) 190/110 (!) 171/96 (!) 161/81   Pulse: 92 (!) 104 90   Resp: _0 Temp:  98.5 F (36.9 C) 98.2 F (36.8 C)   TempSrc:  Oral Oral   SpO2: 98% 99% 96%   Weight:    215 lb 9.6 oz (97.8 kg)  Height:       Weight change: 7 lb 14.4 oz (3.583 kg)  Intake/Output Summary (Last 24 hours) at 06/25/16 1015 Last data filed at 06/25/16 0800  Gross per 24 hour  Intake             7879 ml  Output             2025 ml  Net             5854 ml     Exam: Heart:: Regular rate and rhythm Lungs: normal Abdomen: soft, nontender, normal bowel sounds   Lab Results: Lab Results  Component Value Date   WBC 15.6 (H) 06/25/2016   HGB 15.5 06/25/2016   HCT 45.6 06/25/2016   MCV 91.4 06/25/2016   PLT 190 06/25/2016    BMP Latest Ref Rng & Units 06/25/2016 06/24/2016 06/22/2016  Glucose 65 - 99 mg/dL 99 110(H) 141(H)  BUN 6 - 20 mg/dL _1 Creatinine 0.61 - 1.24 mg/dL 1.10 0.96 1.14  Sodium 135 - 145 mmol/L 140 138 141  Potassium 3.5 - 5.1 mmol/L 3.3(L) 3.4(L) 3.2(L)  Chloride 101 - 111 mmol/L 109 109 107  CO2 22 - 32 mmol/L 23 20(L) 22  Calcium 8.9 - 10.3 mg/dL 9.2 9.3 10.5(H)     Hepatic Function Latest Ref Rng & Units 06/24/2016 06/22/2016 01/25/2016  Total Protein 6.5 - 8.1 g/dL 7.7 8.9(H) 8.5(H)  Albumin 3.5 - 5.0 g/dL 4.6 5.1(H) 5.1(H)  AST 15 - 41 U/L _2 ALT 17 - 63 U/L 15(L) 22 17  Alk Phosphatase 38 - 126 U/L 44 59 56  Total Bilirubin 0.3 - 1.2 mg/dL 0.9 1.1 2.0(H)  Bilirubin, Direct 0.1 - 0.5 mg/dL - - -    Micro Results: No results found for this or any  previous visit (from the past 240 hour(s)). Studies/Results: No results found. Medications: I have reviewed the patient's current medications. Scheduled Meds: . cloNIDine  0.1 mg Transdermal Weekly  . docusate sodium  100 mg Oral BID  . enoxaparin (LOVENOX) injection  40 mg Subcutaneous Q24H  . hydrochlorothiazide  25 mg Oral Daily  . metoCLOPramide (REGLAN) injection  10 mg Intravenous Q6H  . ondansetron (ZOFRAN) IV  8 mg Intravenous Q6H   Or  . ondansetron  8 mg Oral Q6H  . pantoprazole (PROTONIX) IV  40 mg Intravenous Q12H  . sucralfate  1 g Oral Q6H   Continuous Infusions: . sodium chloride 150 mL/hr at 06/24/16 2059   PRN Meds:.acetaminophen **OR** acetaminophen, hydrALAZINE   Assessment: Active Problems:   Intractable nausea and vomiting   Admitted with intractable nausea/vomiting , H/o cannabis use with opiates.H/o NSAID use ,  Nil acute in Ct abdomen. Elevated BP since  admission. Hemorrhagic gastritis with ulcers seen on EGD yesterday . UTOX positive for opiates and cannaboids.    Plan: 1. Continue PPI and discharge on 69m PO prilosec 2. Check  H pylori stool antigen  3. 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 I discussed this with the patient and he is willing to stop 4. OP follow up with Dr ETiffany Kocher 5. Advance diet as tolerated.   LOS: 0 days   KJonathon Bellows11/23/2017, 10:15 AM

## 2016-06-25 NOTE — Progress Notes (Signed)
[facility]. Discharged home in stable condition.

## 2016-06-26 NOTE — Discharge Summary (Signed)
[facility] [place] [facility]   PATIENT NAME: Brandon Allen    MR#:  [id number]  DATE OF BIRTH:  1980-10-11  DATE OF ADMISSION:  06/23/2016 ADMITTING PHYSICIAN: Arnaldo Natal, MD  DATE OF DISCHARGE: 06/25/2016  5:22 PM  PRIMARY CARE PHYSICIAN: No PCP Per Patient    ADMISSION DIAGNOSIS:  Generalized abdominal pain [R10.84] Intractable vomiting with nausea, unspecified vomiting type [R11.2]  DISCHARGE DIAGNOSIS:  Active Problems:   Intractable nausea and vomiting   SECONDARY DIAGNOSIS:   Past Medical History:  Diagnosis Date  . Atrial fibrillation (HCC)    Dr Mariah Milling  . 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 Deafness in right ear   . HTN (hypertension) 12/20/2014  . Kidney stones   . NSAID induced gastritis     HOSPITAL COURSE:   35 year old male with past medical history of substance abuse, previous history of intractable nausea and vomiting secondary to hyperemesis who presents to the hospital due to similar symptoms.  1. Intractable nausea vomiting- due to Opioid withdrawal and Marijuana Hyperemesis.   -CT abdomen pelvis negative for any acute pathology on admission. - seen by GI  and s/p Endoscopy showing severe Gastritis and esophogeal ulcers. he was started on IV PPI and Carafate. He has improved supportive therapy. His diet has been slowly advanced from a clear to now a soft diet which she isn't tolerating without any further nausea vomiting or abdominal pain. -He is therefore being discharged on oral Protonix twice daily along with Carafate. -She was strongly advised to abstain from marijuana use.  2. Hypokalemia-secondary to nausea and vomiting.  -This has improved and resolved with supplementation.  3. Opioid withdrawal-this was suspected as patient does take Percocet on a regular basis.  -She was given supportive care with clonidine, anti-emetics and has improved.  4. Accelerated HTN - this improved with clonidine  patch and as needed IV hydralazine. -Patient was discharged on oral hydrochlorothiazide.  DISCHARGE CONDITIONS:   Stable  CONSULTS OBTAINED:  Treatment Team:  Scot Jun, MD  DRUG ALLERGIES:   Allergies  Allergen Reactions  . Morphine And Related Nausea And Vomiting    DISCHARGE MEDICATIONS:     Medication List    TAKE these medications   hydrochlorothiazide 12.5 MG capsule Commonly known as:  MICROZIDE Take 1 capsule (12.5 mg total) by mouth daily.   pantoprazole 20 MG tablet Commonly known as:  PROTONIX Take 2 tablets (40 mg total) by mouth 2 (two) times daily. What changed:  how much to take  when to take this   promethazine 25 MG tablet Commonly known as:  PHENERGAN Take 25 mg by mouth every 6 (six) hours as needed. For nausea up to 7 days.   sucralfate 1 GM/10ML suspension Commonly known as:  CARAFATE Take 10 mLs (1 g total) by mouth every 6 (six) hours.         DISCHARGE INSTRUCTIONS:   DIET:  Regular diet  DISCHARGE CONDITION:  Stable  ACTIVITY:  Activity as tolerated  OXYGEN:  Home Oxygen: No.   Oxygen Delivery: room air  DISCHARGE LOCATION:  home   If you experience worsening of your admission symptoms, develop shortness of breath, life threatening emergency, suicidal or homicidal thoughts you must seek medical attention immediately by calling 911 or calling your MD immediately  if symptoms less severe.  You Must read complete instructions/literature along with all the possible adverse reactions/side effects for all the Medicines you take  and that have been prescribed to you. Take any new Medicines after you have completely understood and accpet all the possible adverse reactions/side effects.   Please note  You were cared for by a hospitalist during your hospital stay. If you have any questions about your discharge medications or the care you received while you were in the hospital after you are discharged, you can call the  unit and asked to speak with the hospitalist on call if the hospitalist that took care of you is not available. Once you are discharged, your primary care physician will handle any further medical issues. Please note that NO REFILLS for any discharge medications will be authorized once you are discharged, as it is imperative that you return to your primary care physician (or establish a relationship with a primary care physician if you do not have one) for your aftercare needs so that they can reassess your need for medications and monitor your lab values.   DATA REVIEW:   CBC  Recent Labs Lab 06/25/16 0518  WBC 15.6*  HGB 15.5  HCT 45.6  PLT 190    Chemistries   Recent Labs Lab 06/23/16 0320 06/24/16 1221 06/25/16 0518  NA  --  138 140  K  --  3.4* 3.3*  CL  --  109 109  CO2  --  20* 23  GLUCOSE  --  110* 99  BUN  --  9 11  CREATININE  --  0.96 1.10  CALCIUM  --  9.3 9.2  MG 1.8  --   --   AST  --  25  --   ALT  --  15*  --   ALKPHOS  --  44  --   BILITOT  --  0.9  --     Cardiac Enzymes  Recent Labs Lab 06/23/16 0320  TROPONINI <0.03    Microbiology Results  Results for orders placed or performed in visit on 11/18/11  Urine culture     Status: None   Collection Time: 11/18/11  7:42 PM  Result Value Ref Range Status   Micro Text Report   Final       SOURCE: CC    COMMENT                   NO GROWTH IN 36 HOURS   ANTIBIOTIC                                                        RADIOLOGY:  No results found.    Management plans discussed with the patient, family and they are in agreement.  CODE STATUS:  Code Status History    Date Active Date Inactive Code Status Order ID Comments User Context   06/23/2016 10:48 AM 06/25/2016  8:27 PM Full Code [id number]  Arnaldo NatalMichael S Diamond, MD Inpatient   01/25/2016  8:57 PM 01/26/2016  4:20 PM Full Code [id number]  Arnaldo NatalMichael S Diamond, MD Inpatient   12/20/2014  3:27 AM 12/21/2014  6:47 PM Full Code [id number]  Wyatt Hasteavid  K Hower, MD ED      TOTAL TIME TAKING CARE OF THIS PATIENT: 40 minutes.    Houston SirenSAINANI,VIVEK J M.D on 06/26/2016 at 2:17 PM  Between 7am to 6pm - Pager - [phone]  After 6pm go to [email or web address] - password  [email or web address]  [employer] [facility]  Office  [phone]  CC: Primary care physician; No PCP Per Patient

## 2016-06-28 LAB — H. PYLORI ANTIGEN, STOOL: H. PYLORI STOOL AG, EIA: POSITIVE — AB

## 2016-06-28 NOTE — Anesthesia Postprocedure Evaluation (Signed)
Anesthesia Post Note  Patient: Lelon HuhKevin Bergsma  Procedure(s) Performed: Procedure(s) (LRB): ESOPHAGOGASTRODUODENOSCOPY (EGD) (N/A)  Patient location during evaluation: Endoscopy Anesthesia Type: General Level of consciousness: awake and alert Pain management: pain level controlled Vital Signs Assessment: post-procedure vital signs reviewed and stable Respiratory status: spontaneous breathing, nonlabored ventilation, respiratory function stable and patient connected to nasal cannula oxygen Cardiovascular status: blood pressure returned to baseline and stable Postop Assessment: no signs of nausea or vomiting Anesthetic complications: no    Last Vitals:  Vitals:   06/25/16 1110 06/25/16 1546  BP: 129/82 130/70  Pulse: 76 68  Resp: 20   Temp:  36.8 C    Last Pain:  Vitals:   06/25/16 1546  TempSrc: Oral  PainSc:                  Lenard SimmerAndrew Terriyah Westra

## 2016-06-29 ENCOUNTER — Encounter: Payer: Self-pay | Admitting: Unknown Physician Specialty

## 2016-07-03 ENCOUNTER — Telehealth: Payer: Self-pay

## 2016-07-03 NOTE — Telephone Encounter (Signed)
-----   Message from Wyline MoodKiran Anna, MD sent at 06/29/2016 12:54 PM EST ----- Brandon Allen, Inform H pylori ag is positive. Needs triple therapy of Amoxicillin, Clarithromycin and Ppi for 14 days. Check for allergies to penicillin   Dr Harless LittenElliot FYI  Dr Wyline MoodKiran Anna  Gastroenterology/Hepatology Pager: [phone]

## 2016-07-03 NOTE — Telephone Encounter (Deleted)
-----   Message from Kiran Anna, MD sent at 06/29/2016 12:54 PM EST ----- Juvencio Verdi, Inform H pylori ag is positive. Needs triple therapy of Amoxicillin, Clarithromycin and Ppi for 14 days. Check for allergies to penicillin   Dr Elliot FYI  Dr Kiran Anna  Gastroenterology/Hepatology Pager: [phone] 

## 2016-07-03 NOTE — Telephone Encounter (Signed)
Pt notified of stool results. Pt doesn't have health insurance. Ok'd per Dr. Tobi BastosAnna to give him a sample of Pylera to treat. Pt came to [place] to pick up sample and instructions were given. Pt also instructed we will recheck stool 6 weeks after treatment is completed.

## 2016-09-25 ENCOUNTER — Emergency Department
Admission: EM | Admit: 2016-09-25 | Discharge: 2016-09-25 | Disposition: A | Discharge status: Home / Self Care | Payer: Medicaid Other | Attending: Emergency Medicine | Admitting: Emergency Medicine

## 2016-09-25 ENCOUNTER — Emergency Department: Payer: Medicaid Other

## 2016-09-25 ENCOUNTER — Encounter: Payer: Self-pay | Admitting: Emergency Medicine

## 2016-09-25 DIAGNOSIS — R109 Unspecified abdominal pain: Secondary | ICD-10-CM | POA: Diagnosis present

## 2016-09-25 DIAGNOSIS — F17210 Nicotine dependence, cigarettes, uncomplicated: Secondary | ICD-10-CM | POA: Insufficient documentation

## 2016-09-25 DIAGNOSIS — Z79899 Other long term (current) drug therapy: Secondary | ICD-10-CM | POA: Diagnosis not present

## 2016-09-25 DIAGNOSIS — N200 Calculus of kidney: Secondary | ICD-10-CM | POA: Diagnosis not present

## 2016-09-25 DIAGNOSIS — I10 Essential (primary) hypertension: Secondary | ICD-10-CM | POA: Insufficient documentation

## 2016-09-25 LAB — BASIC METABOLIC PANEL
Anion gap: 13 (ref 5–15)
BUN: 13 mg/dL (ref 6–20)
CALCIUM: 10.6 mg/dL — AB (ref 8.9–10.3)
CO2: 22 mmol/L (ref 22–32)
Chloride: 106 mmol/L (ref 101–111)
Creatinine, Ser: 1.38 mg/dL — ABNORMAL HIGH (ref 0.61–1.24)
GFR calc Af Amer: >60 mL/min (ref >60)
GLUCOSE: 166 mg/dL — AB (ref 65–99)
POTASSIUM: 3 mmol/L — AB (ref 3.5–5.1)
SODIUM: 141 mmol/L (ref 135–145)

## 2016-09-25 LAB — URINALYSIS, COMPLETE (UACMP) WITH MICROSCOPIC
Bacteria, UA: NONE SEEN
Bilirubin Urine: NEGATIVE
GLUCOSE, UA: NEGATIVE mg/dL
Ketones, ur: 20 mg/dL — AB
Leukocytes, UA: NEGATIVE
Nitrite: NEGATIVE
PH: 7 (ref 5.0–8.0)
Protein, ur: NEGATIVE mg/dL
SPECIFIC GRAVITY, URINE: 1.039 — AB (ref 1.005–1.030)
SQUAMOUS EPITHELIAL / LPF: NONE SEEN

## 2016-09-25 LAB — URINE DRUG SCREEN, QUALITATIVE (ARMC ONLY)
AMPHETAMINES, UR SCREEN: NOT DETECTED
BENZODIAZEPINE, UR SCRN: NOT DETECTED
Barbiturates, Ur Screen: NOT DETECTED
COCAINE METABOLITE, UR ~~LOC~~: NOT DETECTED
Cannabinoid 50 Ng, Ur ~~LOC~~: POSITIVE — AB
MDMA (ECSTASY) UR SCREEN: NOT DETECTED
METHADONE SCREEN, URINE: NOT DETECTED
OPIATE, UR SCREEN: POSITIVE — AB
PHENCYCLIDINE (PCP) UR S: NOT DETECTED
Tricyclic, Ur Screen: NOT DETECTED

## 2016-09-25 LAB — CBC
HCT: 49.5 % (ref 40.0–52.0)
Hemoglobin: 16.8 g/dL (ref 13.0–18.0)
MCH: 31.1 pg (ref 26.0–34.0)
MCHC: 34 g/dL (ref 32.0–36.0)
MCV: 91.5 fL (ref 80.0–100.0)
PLATELETS: 268 10*3/uL (ref 150–440)
RBC: 5.41 MIL/uL (ref 4.40–5.90)
RDW: 12.4 % (ref 11.5–14.5)
WBC: 20.7 10*3/uL — AB (ref 3.8–10.6)

## 2016-09-25 MED ORDER — ONDANSETRON 4 MG PO TBDP: 4.0000 mg | ORAL_TABLET | Freq: Three times a day (TID) | ORAL | 0 refills | Status: DC | PRN

## 2016-09-25 MED ORDER — HYDROMORPHONE HCL 1 MG/ML IJ SOLN
1.0000 mg | INTRAMUSCULAR | Status: AC
  Administered 2016-09-25: 1 mg via INTRAVENOUS

## 2016-09-25 MED ORDER — SODIUM CHLORIDE 0.9 % IV BOLUS (SEPSIS)
1000.0000 mL | Freq: Once | INTRAVENOUS | Status: AC
  Administered 2016-09-25: 1000 mL via INTRAVENOUS

## 2016-09-25 MED ORDER — HYDROMORPHONE HCL 1 MG/ML IJ SOLN
INTRAMUSCULAR | Status: AC
  Administered 2016-09-25: 1 mg via INTRAVENOUS
  Filled 2016-09-25: qty 1

## 2016-09-25 MED ORDER — TAMSULOSIN HCL 0.4 MG PO CAPS: 0.4000 mg | ORAL_CAPSULE | Freq: Every day | ORAL | 0 refills | Status: DC

## 2016-09-25 MED ORDER — OXYCODONE-ACETAMINOPHEN 5-325 MG PO TABS: 1.0000 | ORAL_TABLET | ORAL | 0 refills | Status: DC | PRN

## 2016-09-25 MED ORDER — ONDANSETRON HCL 4 MG/2ML IJ SOLN
4.0000 mg | Freq: Once | INTRAMUSCULAR | Status: AC
  Administered 2016-09-25: 4 mg via INTRAVENOUS
  Filled 2016-09-25: qty 2

## 2016-09-25 MED ORDER — IOPAMIDOL (ISOVUE-300) INJECTION 61%: 30.0000 mL | Freq: Once | INTRAVENOUS | Status: DC | PRN

## 2016-09-25 MED ORDER — KETOROLAC TROMETHAMINE 30 MG/ML IJ SOLN
30.0000 mg | Freq: Once | INTRAMUSCULAR | Status: AC
  Administered 2016-09-25: 30 mg via INTRAVENOUS
  Filled 2016-09-25: qty 1

## 2016-09-25 MED ORDER — IOPAMIDOL (ISOVUE-300) INJECTION 61%
100.0000 mL | Freq: Once | INTRAVENOUS | Status: AC | PRN
  Administered 2016-09-25: 100 mL via INTRAVENOUS

## 2016-09-25 NOTE — ED Notes (Signed)
Patient transported to CT 

## 2016-09-25 NOTE — ED Provider Notes (Signed)
[facility] Emergency Department Provider Note    First MD Initiated Contact with Patient 09/25/16 0321     (approximate)  I have reviewed the triage vital signs and the nursing notes.   HISTORY  Chief Complaint Flank Pain   HPI Brandon Allen is a 36 y.o. male with low list of chronic medical conditions presents to the emergency department with acute onset of right flank/lower quadrant abdominal pain that is currently 10 out of 10. Patient states pain started 2 hours prior to arrival to the emergency department. Patient also admits to nausea and vomiting. Denies any fever. Patient denies any dysuria no hematuria   Past Medical History:  Diagnosis Date  . Atrial fibrillation (HCC)    Dr Mariah MillingGollan  . 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 Deafness in right ear   . HTN (hypertension) 12/20/2014  . Kidney stones   . NSAID induced gastritis     Patient Active Problem List   Diagnosis Date Noted  . Leukocytosis 01/26/2016  . Hyperglycemia 01/26/2016  . Essential hypertension 01/26/2016  . Cannabis abuse 01/26/2016  . Intractable nausea and vomiting 01/25/2016  . Atrial fibrillation with rapid ventricular response (HCC) 12/20/2014  . HTN (hypertension) 12/20/2014    Past Surgical History:  Procedure Laterality Date  . CHOLECYSTECTOMY    . ESOPHAGOGASTRODUODENOSCOPY    . ESOPHAGOGASTRODUODENOSCOPY  11/2011   NSAID-induced gastritis (Dr Bluford Kaufmannh)  . ESOPHAGOGASTRODUODENOSCOPY N/A 06/24/2016   Procedure: ESOPHAGOGASTRODUODENOSCOPY (EGD);  Surgeon: Scot Junobert T Elliott, MD;  [facility];  Service: Endoscopy;  Laterality: N/A;  . INNER EAR SURGERY      Prior to Admission medications   Medication Sig Start Date End Date Taking? Authorizing Provider  hydrochlorothiazide (MICROZIDE) 12.5 MG capsule Take 1 capsule (12.5 mg total) by mouth daily. Patient not taking: Reported on 01/25/2016 09/13/15   Gayla DossEryka A Gayle, MD  pantoprazole (PROTONIX)  20 MG tablet Take 2 tablets (40 mg total) by mouth 2 (two) times daily. 06/25/16   Houston SirenVivek J Sainani, MD  promethazine (PHENERGAN) 25 MG tablet Take 25 mg by mouth every 6 (six) hours as needed. For nausea up to 7 days. 01/24/16 01/31/16  Historical Provider, MD  sucralfate (CARAFATE) 1 GM/10ML suspension Take 10 mLs (1 g total) by mouth every 6 (six) hours. 06/25/16   Houston SirenVivek J Sainani, MD    Allergies Morphine and related  Family History  Problem Relation Age of Onset  . Heart failure Other   . Colon cancer Maternal Grandfather 8850    Social History Social History  Substance Use Topics  . Smoking status: Current Every Day Smoker    Packs/day: 0.50    Types: Cigarettes    Start date: 12/19/2009  . Smokeless tobacco: Never Used  . Alcohol use No    Review of Systems Constitutional: No fever/chills Eyes: No visual changes. ENT: No sore throat. Cardiovascular: Denies chest pain. Respiratory: Denies shortness of breath. Gastrointestinal: Positive for right flank pain and vomiting  Genitourinary: Negative for dysuria. Musculoskeletal: Negative for back pain. Skin: Negative for rash. Neurological: Negative for headaches, focal weakness or numbness.  10-point ROS otherwise negative.  ____________________________________________   PHYSICAL EXAM:  VITAL SIGNS: ED Triage Vitals  Enc Vitals Group     BP 09/25/16 0319 (!) 204/129     Pulse Rate 09/25/16 0319 66     Resp 09/25/16 0319 (!) 22     Temp 09/25/16 0319 98 F (36.7 C)  Temp Source 09/25/16 0319 Oral     SpO2 09/25/16 0319 100 %     Weight 09/25/16 0308 210 lb (95.3 kg)     Height 09/25/16 0308 6' (1.829 m)     Head Circumference --      Peak Flow --      Pain Score 09/25/16 0308 10     Pain Loc --      Pain Edu? --      Excl. in GC? --     Constitutional: Alert and oriented. Apparent discomfort Eyes: Conjunctivae are normal. PERRL. EOMI. Head: Atraumatic. Ears:  Healthy appearing ear canals and TMs  bilaterally Nose: No congestion/rhinnorhea. Mouth/Throat: Mucous membranes are moist.  Oropharynx non-erythematous. Neck: No stridor.   Cardiovascular: Normal rate, regular rhythm. Good peripheral circulation. Grossly normal heart sounds. Respiratory: Normal respiratory effort.  No retractions. Lungs CTAB. Gastrointestinal: Soft and nontender. No distention.  Musculoskeletal: No lower extremity tenderness nor edema. No gross deformities of extremities. Neurologic:  Normal speech and language. No gross focal neurologic deficits are appreciated.  Skin:  Skin is warm, dry and intact. No rash noted. Psychiatric: Mood and affect are normal. Speech and behavior are normal.  ____________________________________________   LABS (all labs ordered are listed, but only abnormal results are displayed)  Labs Reviewed  CBC - Abnormal; Notable for the following:       Result Value   WBC 20.7 (*)    All other components within normal limits  BASIC METABOLIC PANEL - Abnormal; Notable for the following:    Potassium 3.0 (*)    Glucose, Bld 166 (*)    Creatinine, Ser 1.38 (*)    Calcium 10.6 (*)    All other components within normal limits  URINALYSIS, COMPLETE (UACMP) WITH MICROSCOPIC - Abnormal; Notable for the following:    Color, Urine YELLOW (*)    APPearance CLEAR (*)    Specific Gravity, Urine 1.039 (*)    Hgb urine dipstick SMALL (*)    Ketones, ur 20 (*)    All other components within normal limits  URINE DRUG SCREEN, QUALITATIVE ([facility] ONLY) - Abnormal; Notable for the following:    Opiate, Ur Screen POSITIVE (*)    Cannabinoid 50 Ng, Ur [place] POSITIVE (*)    All other components within normal limits     RADIOLOGY I, [place] N Amy Gothard, personally viewed and evaluated these images (plain radiographs) as part of my medical decision making, as well as reviewing the written report by the radiologist.  Ct Abdomen Pelvis W Contrast  Result Date: 09/25/2016 CLINICAL DATA:  Right lower  quadrant pain EXAM: CT ABDOMEN AND PELVIS WITH CONTRAST TECHNIQUE: Multidetector CT imaging of the abdomen and pelvis was performed using the standard protocol following bolus administration of intravenous contrast. CONTRAST:  ISOVUE-300 IOPAMIDOL (ISOVUE-300) INJECTION 61% COMPARISON:  CT abdomen pelvis 06/23/2016 FINDINGS: Lower chest: No pulmonary nodules. No visible pleural or pericardial effusion. Hepatobiliary: Normal hepatic size and contours without focal liver lesion. No perihepatic ascites. No intra- or extrahepatic biliary dilatation. Status post cholecystectomy. Pancreas: Normal pancreatic contours and enhancement. No peripancreatic fluid collection or pancreatic ductal dilatation. Spleen: Normal. Adrenals/Urinary Tract: Normal adrenal glands. There is mild right hydronephrosis and perinephric stranding. There is a 2 mm calculus at the right ureterovesical junction. Stomach/Bowel: No abnormal bowel dilatation. No bowel wall thickening or adjacent fat stranding to indicate acute inflammation. No abdominal fluid collection. Normal appendix. Vascular/Lymphatic: Normal course and caliber of the major abdominal vessels. No abdominal or pelvic  adenopathy. Reproductive: Normal prostate and seminal vesicles. Musculoskeletal: No lytic or blastic osseous lesion. Normal visualized extrathoracic and extraperitoneal soft tissues. Other: No contributory non-categorized findings. IMPRESSION: 1. 2 mm calculus at the right ureterovesical junction with mild right hydronephrosis and perinephric stranding. 2. Normal appendix. Electronically Signed   By: Deatra Robinson M.D.   On: 09/25/2016 04:21      Procedures     INITIAL IMPRESSION / ASSESSMENT AND PLAN / ED COURSE  Pertinent labs & imaging results that were available during my care of the patient were reviewed by me and considered in my medical decision making (see chart for details).  36 year old male presenting with right flank pain history of  physical exam concern for possible ureterolithiasis was confirmed on CT scan. Patient given 2 doses of IV Dilantin and Zofran with improvement of symptoms. Patient also given 30 mg Toradol      ____________________________________________  FINAL CLINICAL IMPRESSION(S) / ED DIAGNOSES  Final diagnoses:  Right kidney stone     MEDICATIONS GIVEN DURING THIS VISIT:  Medications  iopamidol (ISOVUE-300) 61 % injection 30 mL (30 mLs Oral Canceled Entry 09/25/16 0337)  sodium chloride 0.9 % bolus 1,000 mL (0 mLs Intravenous Stopped 09/25/16 0429)  ondansetron (ZOFRAN) injection 4 mg (4 mg Intravenous Given 09/25/16 0318)  HYDROmorphone (DILAUDID) injection 1 mg (1 mg Intravenous Given 09/25/16 0318)  HYDROmorphone (DILAUDID) injection 1 mg (1 mg Intravenous Given 09/25/16 0353)  sodium chloride 0.9 % bolus 1,000 mL (1,000 mLs Intravenous New Bag/Given 09/25/16 0429)  iopamidol (ISOVUE-300) 61 % injection 100 mL (100 mLs Intravenous Contrast Given 09/25/16 0359)  ketorolac (TORADOL) 30 MG/ML injection 30 mg (30 mg Intravenous Given 09/25/16 0455)     NEW OUTPATIENT MEDICATIONS STARTED DURING THIS VISIT:  New Prescriptions   No medications on file    Modified Medications   No medications on file    Discontinued Medications   No medications on file     Note:  This document was prepared using Dragon voice recognition software and may include unintentional dictation errors.    Darci Current, MD 09/25/16 [phone]

## 2016-09-25 NOTE — ED Notes (Signed)
Followed up with lab on patient's urinalysis/drug test results and they said they got behind but it was in process as of 5 minutes ago.

## 2016-09-25 NOTE — ED Notes (Signed)
Patient given urine strainer with instructions to take it to a urologist for them to analyze it if he catches the stone.

## 2016-09-25 NOTE — ED Triage Notes (Signed)
Pt to room 14 via w/c, diaphoretic; reports awoken 2hrs PTA with right flank pain radiating into right lower abd accomp by N/V; denies hx of same

## 2016-09-25 NOTE — ED Notes (Signed)
ED Provider at bedside. 

## 2016-09-28 ENCOUNTER — Emergency Department
Admission: EM | Admit: 2016-09-28 | Discharge: 2016-09-28 | Disposition: A | Discharge status: Home / Self Care | Payer: Medicaid Other | Attending: Emergency Medicine | Admitting: Emergency Medicine

## 2016-09-28 ENCOUNTER — Encounter: Payer: Self-pay | Admitting: Emergency Medicine

## 2016-09-28 ENCOUNTER — Emergency Department: Payer: Medicaid Other

## 2016-09-28 DIAGNOSIS — F17210 Nicotine dependence, cigarettes, uncomplicated: Secondary | ICD-10-CM | POA: Insufficient documentation

## 2016-09-28 DIAGNOSIS — Z79899 Other long term (current) drug therapy: Secondary | ICD-10-CM | POA: Diagnosis not present

## 2016-09-28 DIAGNOSIS — R1084 Generalized abdominal pain: Secondary | ICD-10-CM

## 2016-09-28 DIAGNOSIS — I10 Essential (primary) hypertension: Secondary | ICD-10-CM | POA: Insufficient documentation

## 2016-09-28 DIAGNOSIS — R112 Nausea with vomiting, unspecified: Secondary | ICD-10-CM

## 2016-09-28 DIAGNOSIS — N23 Unspecified renal colic: Secondary | ICD-10-CM

## 2016-09-28 LAB — CBC WITH DIFFERENTIAL/PLATELET
Band Neutrophils: 0 %
Basophils Absolute: 0 10*3/uL (ref 0–0.1)
Basophils Relative: 0 %
Blasts: 0 %
EOS PCT: 0 %
Eosinophils Absolute: 0 10*3/uL (ref 0–0.7)
HCT: 51.6 % (ref 40.0–52.0)
Hemoglobin: 18.7 g/dL — ABNORMAL HIGH (ref 13.0–18.0)
LYMPHS ABS: 2.3 10*3/uL (ref 1.0–3.6)
Lymphocytes Relative: 15 %
MCH: 32.8 pg (ref 26.0–34.0)
MCHC: 36.3 g/dL — ABNORMAL HIGH (ref 32.0–36.0)
MCV: 90.4 fL (ref 80.0–100.0)
MONOS PCT: 10 %
MYELOCYTES: 0 %
Metamyelocytes Relative: 0 %
Monocytes Absolute: 1.5 10*3/uL — ABNORMAL HIGH (ref 0.2–1.0)
NEUTROS ABS: 11.4 10*3/uL — AB (ref 1.4–6.5)
NEUTROS PCT: 75 %
NRBC: 0 /100{WBCs}
Other: 0 %
PLATELETS: 282 10*3/uL (ref 150–440)
Promyelocytes Absolute: 0 %
RBC: 5.7 MIL/uL (ref 4.40–5.90)
RDW: 12.6 % (ref 11.5–14.5)
WBC: 15.2 10*3/uL — AB (ref 3.8–10.6)

## 2016-09-28 LAB — COMPREHENSIVE METABOLIC PANEL
ALT: 53 U/L (ref 17–63)
ANION GAP: 10 (ref 5–15)
AST: 36 U/L (ref 15–41)
Albumin: 4.9 g/dL (ref 3.5–5.0)
Alkaline Phosphatase: 64 U/L (ref 38–126)
BUN: 20 mg/dL (ref 6–20)
CO2: 25 mmol/L (ref 22–32)
Calcium: 10 mg/dL (ref 8.9–10.3)
Chloride: 104 mmol/L (ref 101–111)
Creatinine, Ser: 1.32 mg/dL — ABNORMAL HIGH (ref 0.61–1.24)
GFR calc non Af Amer: >60 mL/min (ref >60)
Glucose, Bld: 138 mg/dL — ABNORMAL HIGH (ref 65–99)
POTASSIUM: 3.2 mmol/L — AB (ref 3.5–5.1)
SODIUM: 139 mmol/L (ref 135–145)
Total Bilirubin: 1.5 mg/dL — ABNORMAL HIGH (ref 0.3–1.2)
Total Protein: 8.9 g/dL — ABNORMAL HIGH (ref 6.5–8.1)

## 2016-09-28 LAB — MAGNESIUM: MAGNESIUM: 2 mg/dL (ref 1.7–2.4)

## 2016-09-28 LAB — LIPASE, BLOOD: Lipase: 21 U/L (ref 11–51)

## 2016-09-28 MED ORDER — PANTOPRAZOLE SODIUM 20 MG PO TBEC: 40.0000 mg | DELAYED_RELEASE_TABLET | Freq: Two times a day (BID) | ORAL | 1 refills | Status: DC

## 2016-09-28 MED ORDER — SUCRALFATE 1 G PO TABS: 1.0000 g | ORAL_TABLET | Freq: Four times a day (QID) | ORAL | 1 refills | Status: DC | PRN

## 2016-09-28 MED ORDER — HALOPERIDOL LACTATE 5 MG/ML IJ SOLN
2.5000 mg | Freq: Once | INTRAMUSCULAR | Status: AC
  Administered 2016-09-28: 2.5 mg via INTRAVENOUS
  Filled 2016-09-28: qty 1

## 2016-09-28 MED ORDER — MORPHINE SULFATE (PF) 4 MG/ML IV SOLN
4.0000 mg | Freq: Once | INTRAVENOUS | Status: AC
  Administered 2016-09-28: 4 mg via INTRAVENOUS
  Filled 2016-09-28: qty 1

## 2016-09-28 MED ORDER — SODIUM CHLORIDE 0.9 % IV BOLUS (SEPSIS)
2000.0000 mL | INTRAVENOUS | Status: AC
  Administered 2016-09-28: 2000 mL via INTRAVENOUS

## 2016-09-28 MED ORDER — PROMETHAZINE HCL 25 MG RE SUPP: 25.0000 mg | Freq: Four times a day (QID) | RECTAL | 1 refills | Status: DC | PRN

## 2016-09-28 MED ORDER — HALOPERIDOL LACTATE 5 MG/ML IJ SOLN
INTRAMUSCULAR | Status: AC
Start: 2016-09-28 — End: 2016-09-28
  Administered 2016-09-28: 2.5 mg via INTRAVENOUS
  Filled 2016-09-28: qty 1

## 2016-09-28 MED ORDER — DIPHENHYDRAMINE HCL 50 MG/ML IJ SOLN
INTRAMUSCULAR | Status: AC
  Administered 2016-09-28: 25 mg via INTRAVENOUS
  Filled 2016-09-28: qty 1

## 2016-09-28 MED ORDER — HALOPERIDOL LACTATE 5 MG/ML IJ SOLN
2.5000 mg | Freq: Once | INTRAMUSCULAR | Status: AC
  Administered 2016-09-28: 2.5 mg via INTRAVENOUS

## 2016-09-28 MED ORDER — DIPHENHYDRAMINE HCL 50 MG/ML IJ SOLN
25.0000 mg | INTRAMUSCULAR | Status: AC
  Administered 2016-09-28: 25 mg via INTRAVENOUS

## 2016-09-28 NOTE — ED Triage Notes (Signed)
R flank pain x 3 days, states was here and diagnosed with kidney stone. States has had continual vomiting since. Continues painful.

## 2016-09-28 NOTE — ED Provider Notes (Signed)
[facility] Emergency Department Provider Note  ____________________________________________   First MD Initiated Contact with Patient 09/28/16 1103     (approximate)  I have reviewed the triage vital signs and the nursing notes.   HISTORY  Chief Complaint Flank Pain    HPI Brandon Allen is a 36 y.o. male with a complicated medical history for his age as listed belowwhich includes atrial fibrillation, cyclic vomiting syndrome versus cannabinoid hyperemesis syndrome, hypertension, gastritis and/or ulcers, and chronic narcotics and cannabis use who presents with persistent vomiting, right flank, and generalized abdominal pain.  He was seen 3 days ago in this emergency department and diagnosed with a 2 mm right-sided ureteral stone at the ureterovesicular junction.  He states that he has been unable to keep down any fluids or food since going home.  He has had issues with cyclic vomiting in the past and was admitted to this hospital 3 months ago for intractable nausea and vomiting thought to be secondary to cannabis use and gastritis.  He states the pain and the vomiting symptoms are severe, sharp, and nothing makes it better or worse.  He denies fever/chills, chest pain, shortness of breath.  He reports that it is difficult to urinate and he has had less urine output than usual.  He feels dehydrated.  He denies any pain when he urinates and has not seen blood in his urine although he reports his urine has been dark.   Past Medical History:  Diagnosis Date  . Atrial fibrillation (HCC)    Dr Mariah Milling  . 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 Deafness in right ear   . HTN (hypertension) 12/20/2014  . Kidney stones   . NSAID induced gastritis     Patient Active Problem List   Diagnosis Date Noted  . Leukocytosis 01/26/2016  . Hyperglycemia 01/26/2016  . Essential hypertension 01/26/2016  . Cannabis abuse 01/26/2016  . Intractable nausea and  vomiting 01/25/2016  . Atrial fibrillation with rapid ventricular response (HCC) 12/20/2014  . HTN (hypertension) 12/20/2014    Past Surgical History:  Procedure Laterality Date  . CHOLECYSTECTOMY    . ESOPHAGOGASTRODUODENOSCOPY    . ESOPHAGOGASTRODUODENOSCOPY  11/2011   NSAID-induced gastritis (Dr Bluford Kaufmann)  . ESOPHAGOGASTRODUODENOSCOPY N/A 06/24/2016   Procedure: ESOPHAGOGASTRODUODENOSCOPY (EGD);  Surgeon: Scot Jun, MD;  Location: [facility] ENDOSCOPY;  Service: Endoscopy;  Laterality: N/A;  . INNER EAR SURGERY      Prior to Admission medications   Medication Sig Start Date End Date Taking? Authorizing Provider  ondansetron (ZOFRAN ODT) 4 MG disintegrating tablet Take 1 tablet (4 mg total) by mouth every 8 (eight) hours as needed for nausea or vomiting. 09/25/16  Yes Darci Current, MD  oxyCODONE-acetaminophen (ROXICET) 5-325 MG tablet Take 1 tablet by mouth every 4 (four) hours as needed for severe pain. 09/25/16  Yes Darci Current, MD  tamsulosin [facility]) 0.4 MG CAPS capsule Take 1 capsule (0.4 mg total) by mouth daily after breakfast. 09/25/16  Yes Darci Current, MD  hydrochlorothiazide (MICROZIDE) 12.5 MG capsule Take 1 capsule (12.5 mg total) by mouth daily. Patient not taking: Reported on 01/25/2016 09/13/15   Gayla Doss, MD  pantoprazole (PROTONIX) 20 MG tablet Take 2 tablets (40 mg total) by mouth 2 (two) times daily. 09/28/16   Loleta Rose, MD  promethazine (PHENERGAN) 25 MG suppository Place 1 suppository (25 mg total) rectally every 6 (six) hours as needed for nausea. 09/28/16 09/28/17  Loleta Rose,  MD  sucralfate (CARAFATE) 1 g tablet Take 1 tablet (1 g total) by mouth 4 (four) times daily as needed (for abdominal discomfort, nausea, and/or vomiting). 09/28/16   Loleta Rose, MD    Allergies Morphine and related  Family History  Problem Relation Age of Onset  . Heart failure Other   . Colon cancer Maternal Grandfather 45    Social History Social History  Substance  Use Topics  . Smoking status: Current Every Day Smoker    Packs/day: 0.50    Types: Cigarettes    Start date: 12/19/2009  . Smokeless tobacco: Never Used  . Alcohol use No    Review of Systems Constitutional: No fever/chills Eyes: No visual changes. ENT: No sore throat. Cardiovascular: Denies chest pain. Respiratory: Denies shortness of breath. Gastrointestinal: Severe abdominal pain and intractable vomiting for 3 days. Genitourinary: Negative for dysuria. Musculoskeletal: Negative for back pain. Skin: Negative for rash. Neurological: Negative for headaches, focal weakness or numbness.  10-point ROS otherwise negative.  ____________________________________________   PHYSICAL EXAM:  VITAL SIGNS: ED Triage Vitals  Enc Vitals Group     BP 09/28/16 1053 (!) 160/107     Pulse Rate 09/28/16 1053 (!) 155     Resp 09/28/16 1053 20     Temp 09/28/16 1053 98.2 F (36.8 C)     Temp Source 09/28/16 1053 Oral     SpO2 09/28/16 1053 95 %     Weight 09/28/16 1054 210 lb (95.3 kg)     Height 09/28/16 1054 6' (1.829 m)     Head Circumference --      Peak Flow --      Pain Score 09/28/16 1054 10     Pain Loc --      Pain Edu? --      Excl. in GC? --     Constitutional: Alert and oriented. Appears uncomfortable but nontoxic.  Moaning but will answer my questions fully Eyes: Conjunctivae are normal. PERRL. EOMI. Head: Atraumatic. Nose: No congestion/rhinnorhea. Mouth/Throat: Mucous membranes are dry. Neck: No stridor.  No meningeal signs.   Cardiovascular: Tachycardia, regular rhythm. Good peripheral circulation. Grossly normal heart sounds. Respiratory: Normal respiratory effort.  No retractions. Lungs CTAB. Gastrointestinal: Soft with voluntary guarding, generalized tenderness throughout the abdomen that is severe.  Unable to assess right flank pain / CVA tenderness because the patient will not sit up or move to his side Musculoskeletal: No lower extremity tenderness nor edema.  No gross deformities of extremities. Neurologic:  Normal speech and language. No gross focal neurologic deficits are appreciated.  Skin:  Skin is warm, mildly diaphoretic and intact. No rash noted. Psychiatric: Mood and affect are normal. Speech and behavior are normal.  ____________________________________________   LABS (all labs ordered are listed, but only abnormal results are displayed)  Labs Reviewed  COMPREHENSIVE METABOLIC PANEL - Abnormal; Notable for the following:       Result Value   Potassium 3.2 (*)    Glucose, Bld 138 (*)    Creatinine, Ser 1.32 (*)    Total Protein 8.9 (*)    Total Bilirubin 1.5 (*)    All other components within normal limits  CBC WITH DIFFERENTIAL/PLATELET - Abnormal; Notable for the following:    WBC 15.2 (*)    Hemoglobin 18.7 (*)    MCHC 36.3 (*)    Neutro Abs 11.4 (*)    Monocytes Absolute 1.5 (*)    All other components within normal limits  MAGNESIUM  LIPASE, BLOOD  URINALYSIS,  ROUTINE W REFLEX MICROSCOPIC  URINE DRUG SCREEN, QUALITATIVE ([facility] ONLY)   ____________________________________________  EKG  None - EKG not ordered by ED physician ____________________________________________  RADIOLOGY   US Renal  Result Date: 09/28/2016 CLINICAL DATA:  Right flank pain. EXAM: RENAL / URINARY TRACT ULTRASOUND COMPLETE COMPARISON:  09/18/2016 FINDINGS: Right Kidney: Length: 11.1 cm. Echogenicity within normal limits. No mass or hydronephrosis visualized. Left Kidney: Length: 12.4 cm. Echogenicity within normal limits. No mass or hydronephrosis visualized. Bladder: Appears normal for degree of bladder distention. Bilateral ureteral jets noted. IMPRESSION: No acute or focal abnormality. Electronically Signed   By: Maisie Fus  Register   On: 09/28/2016 12:29   Dg Abdomen Acute W/chest  Result Date: 09/28/2016 CLINICAL DATA:  Right flank pain . EXAM: DG ABDOMEN ACUTE W/ 1V CHEST COMPARISON:  CT 09/25/2016, 06/23/2016. FINDINGS: No acute  cardiopulmonary disease. Soft tissue structures are unremarkable. No bowel distention. No free air. Calcified density noted over the left femoral head is unchanged consistent with a bone island. IMPRESSION: No acute abnormality. Electronically Signed   By: Maisie Fus  Register   On: 09/28/2016 12:02    ____________________________________________   PROCEDURES  Procedure(s) performed:   Procedures   Critical Care performed: No ____________________________________________   INITIAL IMPRESSION / ASSESSMENT AND PLAN / ED COURSE  Pertinent labs & imaging results that were available during my care of the patient were reviewed by me and considered in my medical decision making (see chart for details).  The patient had a small kidney stone that was nearly ready to pass into the bladder 3 days ago.  He is afebrile but his heart rate was elevated at 155 at triage.  He appears uncomfortable but nontoxic.  It is difficult to assess his abdomen; with the slightest pressure he guards and grabs my hand and pushes it away.  To avoid additional unnecessary CT scans, I will begin with plain films for an acute abdomen series to look for free air given that he does have a history of gastritis and ulcers.  I will also obtain a renal ultrasound given the recent kidney stone diagnosis.  His primary symptoms are persistent vomiting and he does have a history of cyclic vomiting syndrome versus cannabinoid hyperemesis syndrome (he does report that he has not smoked any marijuana this week).  I will give him Haldol 2.5 mg IV and Benadryl 25 mg which typically are very successful in treating intractable vomiting.  I will hold on Toradol until we see his kidney function because he does clinically appear dehydrated after days of vomiting.  Planning for 2L NS IV bolus.  Patient and family agree with plan.   Clinical Course as of Sep 28 1421  Mon Sep 28, 2016  1323 The patient's workup has been reassuring with a decrease in  his leukocytosis since his visit 3 days ago and no other acute lab abnormalities.  His creatinine is stable at 1.3 since last visit and his potassium is about the same at 3.2.  He continues to report that his pain is no better.  I will treat him with one dose of morphine as well as another Haldol 2.5 mg IV.  I am concerned about this being more of a cyclic vomiting versus cannabinoid hyperemesis versus chronic abdominal pain issue given that his vital signs are normal and his workup is essentially unremarkable.  I anticipate moving towards discharge and outpatient follow-up.  [CF]  1419 I reassessed the patient and he was sleeping comfortably.  After he awoke  he gave me a thumbs up and says that he felt better.  I gave him the reassuring results of his workup and he shook my hand and thanked me.  He says he feels better and is ready to go home.I gave my usual and customary return precautions.   [CF]    Clinical Course User Index [CF] Loleta Roseory Jeanae Whitmill, MD    ____________________________________________  FINAL CLINICAL IMPRESSION(S) / ED DIAGNOSES  Final diagnoses:  Generalized abdominal pain  Non-intractable vomiting with nausea, unspecified vomiting type  Ureteral colic     MEDICATIONS GIVEN DURING THIS VISIT:  Medications  sodium chloride 0.9 % bolus 2,000 mL (0 mLs Intravenous Stopped 09/28/16 1403)  haloperidol lactate (HALDOL) injection 2.5 mg (2.5 mg Intravenous Given 09/28/16 1126)  diphenhydrAMINE (BENADRYL) injection 25 mg (25 mg Intravenous Given 09/28/16 1126)  haloperidol lactate (HALDOL) injection 2.5 mg (2.5 mg Intravenous Given 09/28/16 1401)  morphine 4 MG/ML injection 4 mg (4 mg Intravenous Given 09/28/16 1401)     NEW OUTPATIENT MEDICATIONS STARTED DURING THIS VISIT:  New Prescriptions   PROMETHAZINE (PHENERGAN) 25 MG SUPPOSITORY    Place 1 suppository (25 mg total) rectally every 6 (six) hours as needed for nausea.   SUCRALFATE (CARAFATE) 1 G TABLET    Take 1 tablet (1 g  total) by mouth 4 (four) times daily as needed (for abdominal discomfort, nausea, and/or vomiting).    Modified Medications   Modified Medication Previous Medication   PANTOPRAZOLE (PROTONIX) 20 MG TABLET pantoprazole (PROTONIX) 20 MG tablet      Take 2 tablets (40 mg total) by mouth 2 (two) times daily.    Take 2 tablets (40 mg total) by mouth 2 (two) times daily.    Discontinued Medications   PROMETHAZINE (PHENERGAN) 25 MG TABLET    Take 25 mg by mouth every 6 (six) hours as needed. For nausea up to 7 days.   SUCRALFATE (CARAFATE) 1 GM/10ML SUSPENSION    Take 10 mLs (1 g total) by mouth every 6 (six) hours.     Note:  This document was prepared using Dragon voice recognition software and may include unintentional dictation errors.    Loleta Roseory Armani Gawlik, MD 09/28/16 [phone]

## 2016-09-28 NOTE — ED Notes (Signed)
Dr. York CeriseForbach at bedside.  Pt sweaty, covered in comforter from home. Pt states N&V continues after being dx with kidney stone recently.

## 2016-09-28 NOTE — Discharge Instructions (Signed)

## 2016-09-28 NOTE — ED Notes (Signed)
Pt returned from scans via stretcher.  

## 2016-11-24 ENCOUNTER — Observation Stay
Admission: EM | Admit: 2016-11-24 | Discharge: 2016-11-26 | Disposition: A | Discharge status: Home / Self Care | Payer: Medicaid Other | Attending: Internal Medicine | Admitting: Internal Medicine

## 2016-11-24 ENCOUNTER — Emergency Department: Payer: Medicaid Other

## 2016-11-24 DIAGNOSIS — D72829 Elevated white blood cell count, unspecified: Secondary | ICD-10-CM | POA: Insufficient documentation

## 2016-11-24 DIAGNOSIS — Z87442 Personal history of urinary calculi: Secondary | ICD-10-CM | POA: Diagnosis not present

## 2016-11-24 DIAGNOSIS — R1115 Cyclical vomiting syndrome unrelated to migraine: Secondary | ICD-10-CM

## 2016-11-24 DIAGNOSIS — K219 Gastro-esophageal reflux disease without esophagitis: Secondary | ICD-10-CM | POA: Insufficient documentation

## 2016-11-24 DIAGNOSIS — N200 Calculus of kidney: Secondary | ICD-10-CM | POA: Insufficient documentation

## 2016-11-24 DIAGNOSIS — F1210 Cannabis abuse, uncomplicated: Secondary | ICD-10-CM | POA: Diagnosis present

## 2016-11-24 DIAGNOSIS — I10 Essential (primary) hypertension: Secondary | ICD-10-CM | POA: Diagnosis present

## 2016-11-24 DIAGNOSIS — Z885 Allergy status to narcotic agent status: Secondary | ICD-10-CM | POA: Diagnosis not present

## 2016-11-24 DIAGNOSIS — F17210 Nicotine dependence, cigarettes, uncomplicated: Secondary | ICD-10-CM | POA: Diagnosis not present

## 2016-11-24 DIAGNOSIS — G43A1 Cyclical vomiting, intractable: Secondary | ICD-10-CM | POA: Diagnosis present

## 2016-11-24 DIAGNOSIS — Z79899 Other long term (current) drug therapy: Secondary | ICD-10-CM | POA: Insufficient documentation

## 2016-11-24 LAB — COMPREHENSIVE METABOLIC PANEL
ALK PHOS: 64 U/L (ref 38–126)
ALT: 21 U/L (ref 17–63)
AST: 28 U/L (ref 15–41)
Albumin: 5.8 g/dL — ABNORMAL HIGH (ref 3.5–5.0)
Anion gap: 14 (ref 5–15)
BILIRUBIN TOTAL: 1.1 mg/dL (ref 0.3–1.2)
BUN: 9 mg/dL (ref 6–20)
CALCIUM: 10.7 mg/dL — AB (ref 8.9–10.3)
CHLORIDE: 106 mmol/L (ref 101–111)
CO2: 21 mmol/L — ABNORMAL LOW (ref 22–32)
CREATININE: 1.37 mg/dL — AB (ref 0.61–1.24)
Glucose, Bld: 157 mg/dL — ABNORMAL HIGH (ref 65–99)
Potassium: 3.1 mmol/L — ABNORMAL LOW (ref 3.5–5.1)
Sodium: 141 mmol/L (ref 135–145)
Total Protein: 9.8 g/dL — ABNORMAL HIGH (ref 6.5–8.1)

## 2016-11-24 LAB — CBC WITH DIFFERENTIAL/PLATELET
Basophils Absolute: 0.1 10*3/uL (ref 0–0.1)
Basophils Relative: 1 %
EOS PCT: 1 %
Eosinophils Absolute: 0.1 10*3/uL (ref 0–0.7)
HEMATOCRIT: 51.5 % (ref 40.0–52.0)
Hemoglobin: 17.8 g/dL (ref 13.0–18.0)
LYMPHS ABS: 3.1 10*3/uL (ref 1.0–3.6)
LYMPHS PCT: 16 %
MCH: 31.1 pg (ref 26.0–34.0)
MCHC: 34.6 g/dL (ref 32.0–36.0)
MCV: 89.6 fL (ref 80.0–100.0)
Monocytes Absolute: 1.4 10*3/uL — ABNORMAL HIGH (ref 0.2–1.0)
Monocytes Relative: 7 %
Neutro Abs: 15.2 10*3/uL — ABNORMAL HIGH (ref 1.4–6.5)
Neutrophils Relative %: 75 %
PLATELETS: 251 10*3/uL (ref 150–440)
RBC: 5.74 MIL/uL (ref 4.40–5.90)
RDW: 12.3 % (ref 11.5–14.5)
WBC: 20 10*3/uL — ABNORMAL HIGH (ref 3.8–10.6)

## 2016-11-24 LAB — TROPONIN I: Troponin I: <0.03 ng/mL (ref <0.03)

## 2016-11-24 LAB — LIPASE, BLOOD: Lipase: 26 U/L (ref 11–51)

## 2016-11-24 MED ORDER — PROMETHAZINE HCL 25 MG/ML IJ SOLN
25.0000 mg | Freq: Once | INTRAMUSCULAR | Status: AC
Start: 2016-11-24 — End: 2016-11-24
  Administered 2016-11-24: 25 mg via INTRAVENOUS

## 2016-11-24 MED ORDER — HALOPERIDOL LACTATE 5 MG/ML IJ SOLN
2.5000 mg | Freq: Once | INTRAMUSCULAR | Status: AC
  Administered 2016-11-24: 2.5 mg via INTRAVENOUS
  Filled 2016-11-24: qty 1

## 2016-11-24 MED ORDER — METOCLOPRAMIDE HCL 5 MG/ML IJ SOLN
10.0000 mg | Freq: Once | INTRAMUSCULAR | Status: AC
  Administered 2016-11-24: 10 mg via INTRAVENOUS
  Filled 2016-11-24: qty 2

## 2016-11-24 MED ORDER — PROMETHAZINE HCL 25 MG/ML IJ SOLN
INTRAMUSCULAR | Status: AC
  Filled 2016-11-24: qty 1

## 2016-11-24 MED ORDER — ONDANSETRON HCL 4 MG/2ML IJ SOLN
4.0000 mg | Freq: Once | INTRAMUSCULAR | Status: AC
  Administered 2016-11-24: 4 mg via INTRAVENOUS
  Filled 2016-11-24: qty 2

## 2016-11-24 MED ORDER — KETOROLAC TROMETHAMINE 30 MG/ML IJ SOLN
30.0000 mg | Freq: Once | INTRAMUSCULAR | Status: AC
  Administered 2016-11-24: 30 mg via INTRAVENOUS
  Filled 2016-11-24: qty 1

## 2016-11-24 MED ORDER — HYDROMORPHONE HCL 1 MG/ML IJ SOLN
0.5000 mg | Freq: Once | INTRAMUSCULAR | Status: AC
Start: 2016-11-24 — End: 2016-11-24
  Administered 2016-11-24: 0.5 mg via INTRAVENOUS
  Filled 2016-11-24: qty 1

## 2016-11-24 MED ORDER — SODIUM CHLORIDE 0.9 % IV BOLUS (SEPSIS)
1000.0000 mL | Freq: Once | INTRAVENOUS | Status: AC
  Administered 2016-11-24: 1000 mL via INTRAVENOUS

## 2016-11-24 NOTE — ED Notes (Addendum)
Patient reported that right IV site was coming out. Tegaderm was half off and catheter was 1/2 way out. Blood return was good, but unable to reinsert catheter. Patient was concerned that he didn't received the medication given, but blood return was verified by this writer prior to giving medication and no infiltration noted. MD aware.

## 2016-11-24 NOTE — ED Notes (Signed)
Patient vomited bile on his comforter. Girlfriend took the comforter home. Patient is wrapped up with towel on his head, remains diaphoretic. Patient states he is in pain and that is why he is diaphoretic. Patient was advised again to uncover for comfort. Patient refused.

## 2016-11-24 NOTE — ED Triage Notes (Addendum)
Pt presents to ED with vomiting and epigastric pain for the past 2 hours. Actively vomiting in triage. Pale and diaphoretic. taken directly back to exam room.

## 2016-11-24 NOTE — H&P (Signed)
[facility] - [place] [facility]   PATIENT NAME: Brandon Allen    MR#:  [id number]  DATE OF BIRTH:  [birth year]  DATE OF ADMISSION:  11/24/2016  PRIMARY CARE PHYSICIAN: No PCP Per Patient   REQUESTING/REFERRING PHYSICIAN: Lenard Lance, MD  CHIEF COMPLAINT:   Chief Complaint  Patient presents with  . Chest Pain  . Emesis  . Abdominal Pain    HISTORY OF PRESENT ILLNESS:  Brandon Allen  is a 36 y.o. male who presents with  cyclic vomiting syndrome. Patient has been here multiple times in the past with the same, and has known cannabis use. He does have a history of kidney stones, and has two kidney stones seen within his kidney , but none see in his ureter , and  Hydronephrosis or other signs of having passed a stone. However,  Patient does state that he has right flank pain which began prior to to the beginning of his nausea, but has not seen any stone pass in his urine.  After multiple doses of antiemetics here his vomiting is still uncontrolled.  hospitalists called for admission  PAST MEDICAL HISTORY:   Past Medical History:  Diagnosis Date  . Atrial fibrillation (HCC)    Dr Mariah Milling  . 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 Deafness in right ear   . HTN (hypertension) 12/20/2014  . Kidney stones   . NSAID induced gastritis     PAST SURGICAL HISTORY:   Past Surgical History:  Procedure Laterality Date  . CHOLECYSTECTOMY    . ESOPHAGOGASTRODUODENOSCOPY    . ESOPHAGOGASTRODUODENOSCOPY  11/2011   NSAID-induced gastritis (Dr Bluford Kaufmann)  . ESOPHAGOGASTRODUODENOSCOPY N/A 06/24/2016   Procedure: ESOPHAGOGASTRODUODENOSCOPY (EGD);  Surgeon: Scot Jun, MD;  Location[facility] ENDOSCOPY;  Service: Endoscopy;  Laterality: N/A;  . INNER EAR SURGERY      SOCIAL HISTORY:   Social History  Substance Use Topics  . Smoking status: Current Every Day Smoker    Packs/day: 0.50    Types: Cigarettes    Start date: 12/19/2009  . Smokeless tobacco: Never  Used  . Alcohol use No    FAMILY HISTORY:   Family History  Problem Relation Age of Onset  . Heart failure Other   . Colon cancer Maternal Grandfather 50    DRUG ALLERGIES:   Allergies  Allergen Reactions  . Morphine And Related Nausea And Vomiting    MEDICATIONS AT HOME:   Prior to Admission medications   Medication Sig Start Date End Date Taking? Authorizing Provider  hydrochlorothiazide (MICROZIDE) 12.5 MG capsule Take 1 capsule (12.5 mg total) by mouth daily. Patient not taking: Reported on 01/25/2016 09/13/15   Gayla Doss, MD  ondansetron (ZOFRAN ODT) 4 MG disintegrating tablet Take 1 tablet (4 mg total) by mouth every 8 (eight) hours as needed for nausea or vomiting. 09/25/16   Darci Current, MD  oxyCODONE-acetaminophen (ROXICET) 5-325 MG tablet Take 1 tablet by mouth every 4 (four) hours as needed for severe pain. 09/25/16   Darci Current, MD  pantoprazole (PROTONIX) 20 MG tablet Take 2 tablets (40 mg total) by mouth 2 (two) times daily. 09/28/16   Loleta Rose, MD  promethazine (PHENERGAN) 25 MG suppository Place 1 suppository (25 mg total) rectally every 6 (six) hours as needed for nausea. 09/28/16 09/28/17  Loleta Rose, MD  sucralfate (CARAFATE) 1 g tablet Take 1 tablet (1 g total) by mouth 4 (four) times daily as needed (for abdominal discomfort,  nausea, and/or vomiting). 09/28/16   Loleta Rose, MD  tamsulosin (FLOMAX) 0.4 MG CAPS capsule Take 1 capsule (0.4 mg total) by mouth daily after breakfast. 09/25/16   Darci Current, MD    REVIEW OF SYSTEMS:  Review of Systems  Constitutional: Negative for chills, fever, malaise/fatigue and weight loss.  HENT: Negative for ear pain, hearing loss and tinnitus.   Eyes: Negative for blurred vision, double vision, pain and redness.  Respiratory: Negative for cough, hemoptysis and shortness of breath.   Cardiovascular: Negative for chest pain, palpitations, orthopnea and leg swelling.  Gastrointestinal: Positive for nausea  and vomiting. Negative for abdominal pain, constipation and diarrhea.  Genitourinary: Positive for flank pain. Negative for dysuria, frequency and hematuria.  Musculoskeletal: Negative for back pain, joint pain and neck pain.  Skin:       No acne, rash, or lesions  Neurological: Negative for dizziness, tremors, focal weakness and weakness.  Endo/Heme/Allergies: Negative for polydipsia. Does not bruise/bleed easily.  Psychiatric/Behavioral: Negative for depression. The patient is not nervous/anxious and does not have insomnia.      VITAL SIGNS:   Vitals:   11/24/16 2209 11/24/16 2230 11/24/16 2242 11/24/16 2300  BP:  (!) 179/113 (!) 184/128 (!) 173/113  Pulse: 77 92 96 88  Resp: 16  16   Temp:      TempSrc:      SpO2: 97% 98% 99% 100%  Weight:      Height:       Wt Readings from Last 3 Encounters:  11/24/16 95.3 kg (210 lb)  09/28/16 95.3 kg (210 lb)  09/25/16 95.3 kg (210 lb)    PHYSICAL EXAMINATION:  Physical Exam  Vitals reviewed. Constitutional: He is oriented to person, place, and time. He appears well-developed and well-nourished. No distress.  HENT:  Head: Normocephalic and atraumatic.  Mouth/Throat: Oropharynx is clear and moist.  Eyes: Conjunctivae and EOM are normal. Pupils are equal, round, and reactive to light. No scleral icterus.  Neck: Normal range of motion. Neck supple. No JVD present. No thyromegaly present.  Cardiovascular: Normal rate, regular rhythm and intact distal pulses.  Exam reveals no gallop and no friction rub.   No murmur heard. Respiratory: Effort normal and breath sounds normal. No respiratory distress. He has no wheezes. He has no rales.  GI: Soft. Bowel sounds are normal. He exhibits no distension. There is tenderness.  Musculoskeletal: Normal range of motion. He exhibits tenderness (right flank). He exhibits no edema.  No arthritis, no gout  Lymphadenopathy:    He has no cervical adenopathy.  Neurological: He is alert and oriented to  person, place, and time. No cranial nerve deficit.  No dysarthria, no aphasia  Skin: Skin is warm and dry. No rash noted. No erythema.  Psychiatric: He has a normal mood and affect. His behavior is normal. Judgment and thought content normal.    LABORATORY PANEL:   CBC  Recent Labs Lab 11/24/16 2050  WBC 20.0*  HGB 17.8  HCT 51.5  PLT 251   ------------------------------------------------------------------------------------------------------------------  Chemistries   Recent Labs Lab 11/24/16 2050  NA 141  K 3.1*  CL 106  CO2 21*  GLUCOSE 157*  BUN 9  CREATININE 1.37*  CALCIUM 10.7*  AST 28  ALT 21  ALKPHOS 64  BILITOT 1.1   ------------------------------------------------------------------------------------------------------------------  Cardiac Enzymes  Recent Labs Lab 11/24/16 2050  TROPONINI <0.03   ------------------------------------------------------------------------------------------------------------------  RADIOLOGY:  Ct Renal Stone Study  Result Date: 11/24/2016 CLINICAL DATA:  36 year old male with  history of a vomiting, right flank and epigastric pain for the past 2 hours. Diaphoresis. EXAM: CT ABDOMEN AND PELVIS WITHOUT CONTRAST TECHNIQUE: Multidetector CT imaging of the abdomen and pelvis was performed following the standard protocol without IV contrast. COMPARISON:  CT the abdomen and pelvis 09/25/2016. FINDINGS: Lower chest: Unremarkable. Hepatobiliary: No definite cystic or solid hepatic lesion is identified on today's noncontrast CT examination. Gallbladder is not visualized, presumably surgically absent (no surgical clips are noted in the gallbladder fossa). Pancreas: No pancreatic mass or peripancreatic inflammatory changes are noted on today's noncontrast CT examination. Spleen: Unremarkable. Adrenals/Urinary Tract: 2 mm and 3 mm nonobstructive calculi are present in the lower pole collecting system of the left kidney. No additional calculi  are noted within the collecting system of the right kidney, along the course of either ureter, or within the lumen of the urinary bladder. There is no hydroureteronephrosis. Unenhanced appearance of the urinary bladder is normal. Bilateral adrenal glands are normal in appearance. Stomach/Bowel: The appearance of the stomach is normal. There is no pathologic dilatation of small bowel or colon. Normal appendix. Vascular/Lymphatic: No atherosclerotic calcifications or definite aneurysm noted in the abdominal or pelvic vasculature. No lymphadenopathy noted in the abdomen or pelvis. Reproductive: Prostate gland and seminal vesicles are unremarkable in appearance. Other: No significant volume of ascites.  No pneumoperitoneum. Musculoskeletal: There are no aggressive appearing lytic or blastic lesions noted in the visualized portions of the skeleton. IMPRESSION: 1. Two small nonobstructive calculi in the lower pole collecting system of left kidney measuring up to 3 mm. No ureteral stones or findings of urinary tract obstruction are noted at this time. 2. Normal appendix. Electronically Signed   By: Trudie Reed M.D.   On: 11/24/2016 21:35    EKG:   Orders placed or performed during the hospital encounter of 11/24/16  . ED EKG  . ED EKG  . EKG 12-Lead  . EKG 12-Lead    IMPRESSION AND PLAN:  Principal Problem:   Cyclic vomiting syndrome - prn antiemetics, IV fluids, likely secondary to cannabis use, UA and urine tox pending, cannabis cessation counseled Active Problems:   HTN (hypertension) - continue home meds   Right flank pain - prn analgesics, monitor for passage of stone in his urine   Leukocytosis - suspect hemoconcentration, recheck AM value after hydration   GERD (gastroesophageal reflux disease) - home dose PPI  All the records are reviewed and case discussed with ED provider. Management plans discussed with the patient and/or family.  DVT PROPHYLAXIS: SubQ lovenox  GI PROPHYLAXIS:  PPI  ADMISSION STATUS: Observation  CODE STATUS: Full Code Status History    Date Active Date Inactive Code Status Order ID Comments User Context   06/23/2016 10:48 AM 06/25/2016  8:27 PM Full Code [id number]  Arnaldo Natal, MD Inpatient   01/25/2016  8:57 PM 01/26/2016  4:20 PM Full Code [id number]  Arnaldo Natal, MD Inpatient   12/20/2014  3:27 AM 12/21/2014  6:47 PM Full Code [id number]  Wyatt Haste, MD ED      TOTAL TIME TAKING CARE OF THIS PATIENT: 40 minutes.   Ezell Poke FIELDING 11/24/2016, 11:50 PM  [facility]  Office  [phone]  CC: Primary care physician; No PCP Per Patient  Note:  This document was prepared using Dragon voice recognition software and may include unintentional dictation errors.

## 2016-11-24 NOTE — ED Provider Notes (Addendum)
[facility] Emergency Department Provider Note  Time seen: 8:48 PM  I have reviewed the triage vital signs and the nursing notes.   HISTORY  Chief Complaint Chest Pain; Emesis; and Abdominal Pain    HPI Brandon Allen is a 36 y.o. male with a past medical history of cyclical vomiting syndrome, kidney stones, hypertension, presents to the emergency department with severe upper and right-sided abdominal pain nausea vomiting and diffuse diaphoresis. According to the patient approximately 2 hours ago he developed acute onset of upper and right-sided abdominal pain 10/10 in severity, along with diffuse diaphoresis nausea and intractable vomiting. Attempted to use a Phenergan suppository home without relief continues with vomiting in the emergency department. Here the patient is in moderate distress due to pain and vomiting. He is quite diaphoretic states the pain is mostly in his epigastrium and right side.  Past Medical History:  Diagnosis Date  . Atrial fibrillation (HCC)    Dr Mariah Milling  . 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 Deafness in right ear   . HTN (hypertension) 12/20/2014  . Kidney stones   . NSAID induced gastritis     Patient Active Problem List   Diagnosis Date Noted  . Leukocytosis 01/26/2016  . Hyperglycemia 01/26/2016  . Essential hypertension 01/26/2016  . Cannabis abuse 01/26/2016  . Intractable nausea and vomiting 01/25/2016  . Atrial fibrillation with rapid ventricular response (HCC) 12/20/2014  . HTN (hypertension) 12/20/2014    Past Surgical History:  Procedure Laterality Date  . CHOLECYSTECTOMY    . ESOPHAGOGASTRODUODENOSCOPY    . ESOPHAGOGASTRODUODENOSCOPY  11/2011   NSAID-induced gastritis (Dr Bluford Kaufmann)  . ESOPHAGOGASTRODUODENOSCOPY N/A 06/24/2016   Procedure: ESOPHAGOGASTRODUODENOSCOPY (EGD);  Surgeon: Scot Jun, MD;  Location: [facility] END[facility]COPY;  Service: Endoscopy;  Laterality: N/A;  . INNER EAR SURGERY       Prior to Admission medications   Medication Sig Start Date End Date Taking? Authorizing Provider  hydrochlorothiazide (MICROZIDE) 12.5 MG capsule Take 1 capsule (12.5 mg total) by mouth daily. Patient not taking: Reported on 01/25/2016 09/13/15   Gayla Doss, MD  ondansetron (ZOFRAN ODT) 4 MG disintegrating tablet Take 1 tablet (4 mg total) by mouth every 8 (eight) hours as needed for nausea or vomiting. 09/25/16   Darci Current, MD  oxyCODONE-acetaminophen (ROXICET) 5-325 MG tablet Take 1 tablet by mouth every 4 (four) hours as needed for severe pain. 09/25/16   Darci Current, MD  pantoprazole (PROTONIX) 20 MG tablet Take 2 tablets (40 mg total) by mouth 2 (two) times daily. 09/28/16   Loleta Rose, MD  promethazine (PHENERGAN) 25 MG suppository Place 1 suppository (25 mg total) rectally every 6 (six) hours as needed for nausea. 09/28/16 09/28/17  Loleta Rose, MD  sucralfate (CARAFATE) 1 g tablet Take 1 tablet (1 g total) by mouth 4 (four) times daily as needed (for abdominal discomfort, nausea, and/or vomiting). 09/28/16   Loleta Rose, MD  tamsulosin (FLOMAX) 0.4 MG CAPS capsule Take 1 capsule (0.4 mg total) by mouth daily after breakfast. 09/25/16   Darci Current, MD    Allergies  Allergen Reactions  . Morphine And Related Nausea And Vomiting    Family History  Problem Relation Age of Onset  . Heart failure Other   . Colon cancer Maternal Grandfather 10    Social History Social History  Substance Use Topics  . Smoking status: Current Every Day Smoker    Packs/day: 0.50    Types: Cigarettes  Start date: 12/19/2009  . Smokeless tobacco: Never Used  . Alcohol use No    Review of Systems Constitutional: Negative for fever. Cardiovascular: Negative for chest pain. Respiratory: Negative for shortness of breath. Gastrointestinal: Epigastric and right-sided abdominal pain. Positive for nausea and vomiting. Negative for diarrhea. Genitourinary: Negative for  dysuria. Neurological: Negative for headache All other ROS negative  ____________________________________________   PHYSICAL EXAM:  VITAL SIGNS: ED Triage Vitals [11/24/16 2029]  Enc Vitals Group     BP (!) 138/110     Pulse Rate (!) 109     Resp (!) 22     Temp 98.9 F (37.2 C)     Temp Source Oral     SpO2 99 %     Weight 210 lb (95.3 kg)     Height 6' (1.829 m)     Head Circumference      Peak Flow      Pain Score      Pain Loc      Pain Edu?      Excl. in GC?     Constitutional: Alert and oriented. Well appearing and in no distress. Eyes: Normal exam ENT   Head: Normocephalic and atraumatic   Mouth/Throat: Mucous membranes are moist. Cardiovascular: Normal rate, regular rhythm. No murmur Respiratory: Normal respiratory effort without tachypnea nor retractions. Breath sounds are clear  Gastrointestinal: Soft and nontender. No distention.   Musculoskeletal: Nontender with normal range of motion in all extremities. Neurologic:  Normal speech and language. No gross focal neurologic deficits  Skin:  Skin is warm, dry and intact.  Psychiatric: Mood and affect are normal.  ____________________________________________    EKG  EKG reviewed and interpreted by myself shows sinus tachycardia 101 bpm, narrow QRS, normal axis, intervals with nonspecific ST changes. No ST elevation.  ____________________________________________    RADIOLOGY  CT negative  ____________________________________________   INITIAL IMPRESSION / ASSESSMENT AND PLAN / ED COURSE  Pertinent labs & imaging results that were available during my care of the patient were reviewed by me and considered in my medical decision making (see chart for details).  Patient presents to the emergency department with nausea vomiting and significant epigastric and right flank pain. Patient has a history of kidney stones but also a history of cyclical vomiting syndrome thought to be due to cannabinoid  use. Patient continues to use marijuana although states he did not use any today. We will treat pain, nausea, IV hydrate obtain a CT renal scan and continue to closely monitor.  CT scan is negative. Patient has not vomited since receiving Phenergan. He continues to state significant abdominal pain. Highly suspect cyclical vomiting syndrome. Patient has similar ER visits in the past. We will dose Haldol and Toradol and continue to closely monitor.  Patient continues to state abdominal pain. I discussed with the patient at this point given his continued nausea after receiving Zofran, Reglan, Haldol 2, Phenergan, Dilaudid, Toradol, I recommended admission to the hospital for further treatment. Patient is requesting another dose of narcotic pain medication. I discussed with the patient that I am not comfortable giving another dose of narcotic medication but I would be happy to admit him to the hospital for further workup and treatment. Patient states he is not going to be admitted and if we're not going to give him pain medicine than he wants to be discharged home. Given the patient's continued abdominal discomfort and nausea and we will place the patient on Bentyl and Zofran.   Upon  trying to discharge patient once again began violently vomiting. He is now agreeable to admission. ____________________________________________   FINAL CLINICAL IMPRESSION(S) / ED DIAGNOSES  Nausea vomiting Abdominal pain    Minna Antis, MD 11/24/16 2332    Minna Antis, MD 11/24/16 [phone]

## 2016-11-24 NOTE — ED Notes (Signed)
Patient is extremely diaphoretic, skin is clammy. Patient is wrapped up with comforter and towel on his head. Patient advised to uncover for comfort. Patient states pain is making him sweat and refused to take off his comforter.

## 2016-11-24 NOTE — ED Notes (Addendum)
MD Paduchowski at bedside to discuss possible admission.  Pt sts that he is still in pain. Pt unhappy at the prospect of admission. Pt continues to ask for pain medication. Pt also sts that he would rather go home if admission would not resolve his pain.

## 2016-11-24 NOTE — ED Notes (Signed)
Pts gown and linens changed.  Pt able to stand w/o assistance.  Pt given 2 warm blankets upon request.  Pt continues to be diaphoretic.  Pt reminded of need for urine sample.  Pt verbalized understanding. This RN left pt in bed, significant other at bedside. Pt had eyes closed, no emesis at this time.

## 2016-11-25 LAB — URINALYSIS, COMPLETE (UACMP) WITH MICROSCOPIC
Bilirubin Urine: NEGATIVE
GLUCOSE, UA: NEGATIVE mg/dL
Ketones, ur: 20 mg/dL — AB
Leukocytes, UA: NEGATIVE
NITRITE: NEGATIVE
PH: 5 (ref 5.0–8.0)
Protein, ur: 100 mg/dL — AB
SPECIFIC GRAVITY, URINE: 1.03 (ref 1.005–1.030)

## 2016-11-25 LAB — BASIC METABOLIC PANEL
Anion gap: 6 (ref 5–15)
BUN: 10 mg/dL (ref 6–20)
CALCIUM: 9.7 mg/dL (ref 8.9–10.3)
CO2: 22 mmol/L (ref 22–32)
CREATININE: 1.13 mg/dL (ref 0.61–1.24)
Chloride: 112 mmol/L — ABNORMAL HIGH (ref 101–111)
Glucose, Bld: 132 mg/dL — ABNORMAL HIGH (ref 65–99)
Potassium: 3.8 mmol/L (ref 3.5–5.1)
SODIUM: 140 mmol/L (ref 135–145)

## 2016-11-25 LAB — URINE DRUG SCREEN, QUALITATIVE (ARMC ONLY)
AMPHETAMINES, UR SCREEN: NOT DETECTED
Barbiturates, Ur Screen: NOT DETECTED
Benzodiazepine, Ur Scrn: NOT DETECTED
Cannabinoid 50 Ng, Ur ~~LOC~~: POSITIVE — AB
Cocaine Metabolite,Ur ~~LOC~~: POSITIVE — AB
MDMA (ECSTASY) UR SCREEN: NOT DETECTED
METHADONE SCREEN, URINE: NOT DETECTED
OPIATE, UR SCREEN: POSITIVE — AB
PHENCYCLIDINE (PCP) UR S: NOT DETECTED
Tricyclic, Ur Screen: POSITIVE — AB

## 2016-11-25 LAB — CBC
HCT: 47.2 % (ref 40.0–52.0)
Hemoglobin: 16.3 g/dL (ref 13.0–18.0)
MCH: 31.5 pg (ref 26.0–34.0)
MCHC: 34.4 g/dL (ref 32.0–36.0)
MCV: 91.5 fL (ref 80.0–100.0)
PLATELETS: 235 10*3/uL (ref 150–440)
RBC: 5.16 MIL/uL (ref 4.40–5.90)
RDW: 12.5 % (ref 11.5–14.5)
WBC: 16 10*3/uL — AB (ref 3.8–10.6)

## 2016-11-25 MED ORDER — ONDANSETRON HCL 4 MG PO TABS: 4.0000 mg | ORAL_TABLET | Freq: Three times a day (TID) | ORAL | 0 refills | Status: DC | PRN

## 2016-11-25 MED ORDER — PANTOPRAZOLE SODIUM 40 MG PO TBEC: 40.0000 mg | DELAYED_RELEASE_TABLET | Freq: Two times a day (BID) | ORAL | 0 refills | Status: DC

## 2016-11-25 MED ORDER — TRAMADOL HCL 50 MG PO TABS: 50.0000 mg | ORAL_TABLET | Freq: Four times a day (QID) | ORAL | 0 refills | Status: DC | PRN

## 2016-11-25 MED ORDER — ONDANSETRON HCL 4 MG/2ML IJ SOLN: 4.0000 mg | Freq: Four times a day (QID) | INTRAMUSCULAR | Status: DC | PRN

## 2016-11-25 MED ORDER — KETOROLAC TROMETHAMINE 30 MG/ML IJ SOLN
30.0000 mg | Freq: Four times a day (QID) | INTRAMUSCULAR | Status: DC | PRN
  Administered 2016-11-25: 30 mg via INTRAVENOUS
  Filled 2016-11-25: qty 1

## 2016-11-25 MED ORDER — OXYCODONE-ACETAMINOPHEN 5-325 MG PO TABS
1.0000 | ORAL_TABLET | Freq: Four times a day (QID) | ORAL | Status: DC | PRN
  Administered 2016-11-25: 09:00:00 1 via ORAL
  Filled 2016-11-25: qty 1

## 2016-11-25 MED ORDER — ONDANSETRON HCL 4 MG PO TABS: 4.0000 mg | ORAL_TABLET | Freq: Four times a day (QID) | ORAL | Status: DC | PRN

## 2016-11-25 MED ORDER — METOCLOPRAMIDE HCL 5 MG/ML IJ SOLN: 5.0000 mg | Freq: Four times a day (QID) | INTRAMUSCULAR | Status: DC | PRN

## 2016-11-25 MED ORDER — PROCHLORPERAZINE EDISYLATE 5 MG/ML IJ SOLN
5.0000 mg | INTRAMUSCULAR | Status: DC | PRN
  Filled 2016-11-25: qty 1

## 2016-11-25 MED ORDER — OXYCODONE-ACETAMINOPHEN 5-325 MG PO TABS: 1.0000 | ORAL_TABLET | Freq: Four times a day (QID) | ORAL | Status: DC | PRN

## 2016-11-25 MED ORDER — ENOXAPARIN SODIUM 40 MG/0.4ML ~~LOC~~ SOLN
40.0000 mg | SUBCUTANEOUS | Status: DC
  Administered 2016-11-25: 21:00:00 40 mg via SUBCUTANEOUS
  Filled 2016-11-25: qty 0.4

## 2016-11-25 MED ORDER — DOCUSATE SODIUM 100 MG PO CAPS
100.0000 mg | ORAL_CAPSULE | Freq: Two times a day (BID) | ORAL | Status: DC
  Filled 2016-11-25 (×2): qty 1

## 2016-11-25 MED ORDER — CLONIDINE HCL 0.2 MG/24HR TD PTWK
0.2000 mg | MEDICATED_PATCH | TRANSDERMAL | Status: DC
  Filled 2016-11-25: qty 1

## 2016-11-25 MED ORDER — HYDROMORPHONE HCL 1 MG/ML IJ SOLN
0.2500 mg | INTRAMUSCULAR | Status: DC | PRN
  Administered 2016-11-25 (×2): 0.25 mg via INTRAVENOUS
  Filled 2016-11-25: qty 1
  Filled 2016-11-25: qty 0.5

## 2016-11-25 MED ORDER — HYDRALAZINE HCL 20 MG/ML IJ SOLN
10.0000 mg | Freq: Once | INTRAMUSCULAR | Status: AC
  Administered 2016-11-25: 10 mg via INTRAVENOUS
  Filled 2016-11-25: qty 1

## 2016-11-25 MED ORDER — NITROGLYCERIN 2 % TD OINT
0.5000 [in_us] | TOPICAL_OINTMENT | Freq: Four times a day (QID) | TRANSDERMAL | Status: DC
  Filled 2016-11-25: qty 0.5
  Filled 2016-11-25: qty 30

## 2016-11-25 MED ORDER — HYDROCHLOROTHIAZIDE 25 MG PO TABS: 25.0000 mg | ORAL_TABLET | Freq: Every day | ORAL | 0 refills | Status: DC

## 2016-11-25 MED ORDER — HYDRALAZINE HCL 25 MG PO TABS: 25.0000 mg | ORAL_TABLET | Freq: Three times a day (TID) | ORAL | 0 refills | Status: DC

## 2016-11-25 MED ORDER — SUCRALFATE 1 G PO TABS: 1.0000 g | ORAL_TABLET | Freq: Three times a day (TID) | ORAL | 0 refills | Status: DC

## 2016-11-25 MED ORDER — NITROGLYCERIN 2 % TD OINT
0.5000 [in_us] | TOPICAL_OINTMENT | Freq: Four times a day (QID) | TRANSDERMAL | Status: DC
  Administered 2016-11-25: 0.5 [in_us] via TOPICAL
  Filled 2016-11-25 (×2): qty 0.5

## 2016-11-25 MED ORDER — SUCRALFATE 1 G PO TABS
1.0000 g | ORAL_TABLET | Freq: Three times a day (TID) | ORAL | Status: DC
  Administered 2016-11-25 – 2016-11-26 (×5): 1 g via ORAL
  Filled 2016-11-25 (×7): qty 1

## 2016-11-25 MED ORDER — OXYCODONE-ACETAMINOPHEN 5-325 MG PO TABS
2.0000 | ORAL_TABLET | Freq: Four times a day (QID) | ORAL | Status: DC | PRN
  Administered 2016-11-25 – 2016-11-26 (×3): 2 via ORAL
  Filled 2016-11-25 (×3): qty 2

## 2016-11-25 MED ORDER — AMLODIPINE BESYLATE 10 MG PO TABS: 10.0000 mg | ORAL_TABLET | Freq: Every day | ORAL | 0 refills | Status: DC

## 2016-11-25 MED ORDER — HYDRALAZINE HCL 50 MG PO TABS
25.0000 mg | ORAL_TABLET | Freq: Three times a day (TID) | ORAL | Status: DC
  Administered 2016-11-25 (×2): 25 mg via ORAL
  Filled 2016-11-25: qty 2
  Filled 2016-11-25: qty 1

## 2016-11-25 MED ORDER — ACETAMINOPHEN 325 MG PO TABS: 650.0000 mg | ORAL_TABLET | Freq: Four times a day (QID) | ORAL | Status: DC | PRN

## 2016-11-25 MED ORDER — SODIUM CHLORIDE 0.9% FLUSH
3.0000 mL | Freq: Two times a day (BID) | INTRAVENOUS | Status: DC
Start: 2016-11-25 — End: 2016-11-26
  Administered 2016-11-25 (×3): 3 mL via INTRAVENOUS

## 2016-11-25 MED ORDER — LABETALOL HCL 5 MG/ML IV SOLN
10.0000 mg | INTRAVENOUS | Status: DC | PRN
  Administered 2016-11-25: 10 mg via INTRAVENOUS
  Filled 2016-11-25 (×2): qty 4

## 2016-11-25 MED ORDER — HYDROCHLOROTHIAZIDE 25 MG PO TABS
25.0000 mg | ORAL_TABLET | Freq: Every day | ORAL | Status: DC
  Administered 2016-11-25 – 2016-11-26 (×2): 25 mg via ORAL
  Filled 2016-11-25 (×2): qty 1

## 2016-11-25 MED ORDER — TRAMADOL HCL 50 MG PO TABS: 50.0000 mg | ORAL_TABLET | Freq: Four times a day (QID) | ORAL | Status: DC | PRN

## 2016-11-25 MED ORDER — LABETALOL HCL 5 MG/ML IV SOLN
10.0000 mg | INTRAVENOUS | Status: DC | PRN
  Administered 2016-11-25 – 2016-11-26 (×3): 10 mg via INTRAVENOUS
  Filled 2016-11-25 (×4): qty 4

## 2016-11-25 MED ORDER — SODIUM CHLORIDE 0.9 % IV SOLN
INTRAVENOUS | Status: DC
  Administered 2016-11-25 (×2): via INTRAVENOUS

## 2016-11-25 MED ORDER — HYDRALAZINE HCL 50 MG PO TABS
50.0000 mg | ORAL_TABLET | Freq: Three times a day (TID) | ORAL | Status: DC
  Administered 2016-11-25 – 2016-11-26 (×2): 50 mg via ORAL
  Filled 2016-11-25 (×2): qty 1

## 2016-11-25 MED ORDER — PANTOPRAZOLE SODIUM 40 MG PO TBEC
40.0000 mg | DELAYED_RELEASE_TABLET | Freq: Two times a day (BID) | ORAL | Status: DC
  Administered 2016-11-25 – 2016-11-26 (×3): 40 mg via ORAL
  Filled 2016-11-25 (×4): qty 1

## 2016-11-25 MED ORDER — AMLODIPINE BESYLATE 10 MG PO TABS
10.0000 mg | ORAL_TABLET | Freq: Every day | ORAL | Status: DC
  Administered 2016-11-25 – 2016-11-26 (×2): 10 mg via ORAL
  Filled 2016-11-25 (×2): qty 1

## 2016-11-25 MED ORDER — ACETAMINOPHEN 650 MG RE SUPP: 650.0000 mg | Freq: Four times a day (QID) | RECTAL | Status: DC | PRN

## 2016-11-25 NOTE — Progress Notes (Signed)
BP 196/120 twenty minutes after receiving dilaudid.  Spoke with Dr Sheryle Hail and nitro paste 1/2 inch order q 6 hours. Henriette Combs RN

## 2016-11-25 NOTE — ED Notes (Signed)
Report given to Sarah RN

## 2016-11-25 NOTE — Progress Notes (Signed)
[facility] - [place] [facility]   PATIENT NAME: Brandon Allen    MR#:  [id number]  DATE OF BIRTH:  03/25/1981  SUBJECTIVE:  Came in with vomiting and abdominal pain. Patient had some diaphoresis in the ER. No fever. No chest pain. No vomiting since coming to the room  REVIEW OF SYSTEMS:   Review of Systems  Constitutional: Negative for chills, fever and weight loss.  HENT: Negative for ear discharge, ear pain and nosebleeds.   Eyes: Negative for blurred vision, pain and discharge.  Respiratory: Negative for sputum production, shortness of breath, wheezing and stridor.   Cardiovascular: Negative for chest pain, palpitations, orthopnea and PND.  Gastrointestinal: Positive for nausea and vomiting. Negative for abdominal pain and diarrhea.  Genitourinary: Negative for frequency and urgency.  Musculoskeletal: Negative for back pain and joint pain.  Neurological: Negative for sensory change, speech change, focal weakness and weakness.  Psychiatric/Behavioral: Negative for depression and hallucinations. The patient is not nervous/anxious.    Tolerating Diet:cld Tolerating PT: ambulatory  DRUG ALLERGIES:   Allergies  Allergen Reactions  . Morphine And Related Nausea And Vomiting    VITALS:  Blood pressure (!) 159/107, pulse 71, temperature 98.6 F (37 C), temperature source Oral, resp. rate 16, height 6' (1.829 m), weight 95.3 kg (210 lb), SpO2 99 %.  PHYSICAL EXAMINATION:   Physical Exam  GENERAL:  36 y.o.-year-old patient lying in the bed with no acute distress.  EYES: Pupils equal, round, reactive to light and accommodation. No scleral icterus. Extraocular muscles intact.  HEENT: Head atraumatic, normocephalic. Oropharynx and nasopharynx clear.  NECK:  Supple, no jugular venous distention. No thyroid enlargement, no tenderness.  LUNGS: Normal breath sounds bilaterally, no wheezing, rales, rhonchi. No use of accessory muscles of respiration.   CARDIOVASCULAR: S1, S2 normal. No murmurs, rubs, or gallops.  ABDOMEN: Soft, nontender, nondistended. Bowel sounds present. No organomegaly or mass.  EXTREMITIES: No cyanosis, clubbing or edema b/l.    NEUROLOGIC: Cranial nerves II through XII are intact. No focal Motor or sensory deficits b/l.   PSYCHIATRIC:  patient is alert and oriented x 3.  SKIN: No obvious rash, lesion, or ulcer.   LABORATORY PANEL:  CBC  Recent Labs Lab 11/25/16 0701  WBC 16.0*  HGB 16.3  HCT 47.2  PLT 235    Chemistries   Recent Labs Lab 11/24/16 2050 11/25/16 0701  NA 141 140  K 3.1* 3.8  CL 106 112*  CO2 21* 22  GLUCOSE 157* 132*  BUN 9 10  CREATININE 1.37* 1.13  CALCIUM 10.7* 9.7  AST 28  --   ALT 21  --   ALKPHOS 64  --   BILITOT 1.1  --    Cardiac Enzymes  Recent Labs Lab 11/24/16 2050  TROPONINI <0.03   RADIOLOGY:  Ct Renal Stone Study  Result Date: 11/24/2016 CLINICAL DATA:  36 year old male with history of a vomiting, right flank and epigastric pain for the past 2 hours. Diaphoresis. EXAM: CT ABDOMEN AND PELVIS WITHOUT CONTRAST TECHNIQUE: Multidetector CT imaging of the abdomen and pelvis was performed following the standard protocol without IV contrast. COMPARISON:  CT the abdomen and pelvis 09/25/2016. FINDINGS: Lower chest: Unremarkable. Hepatobiliary: No definite cystic or solid hepatic lesion is identified on today's noncontrast CT examination. Gallbladder is not visualized, presumably surgically absent (no surgical clips are noted in the gallbladder fossa). Pancreas: No pancreatic mass or peripancreatic inflammatory changes are noted on today's noncontrast CT examination. Spleen: Unremarkable. Adrenals/Urinary Tract: 2  mm and 3 mm nonobstructive calculi are present in the lower pole collecting system of the left kidney. No additional calculi are noted within the collecting system of the right kidney, along the course of either ureter, or within the lumen of the urinary bladder.  There is no hydroureteronephrosis. Unenhanced appearance of the urinary bladder is normal. Bilateral adrenal glands are normal in appearance. Stomach/Bowel: The appearance of the stomach is normal. There is no pathologic dilatation of small bowel or colon. Normal appendix. Vascular/Lymphatic: No atherosclerotic calcifications or definite aneurysm noted in the abdominal or pelvic vasculature. No lymphadenopathy noted in the abdomen or pelvis. Reproductive: Prostate gland and seminal vesicles are unremarkable in appearance. Other: No significant volume of ascites.  No pneumoperitoneum. Musculoskeletal: There are no aggressive appearing lytic or blastic lesions noted in the visualized portions of the skeleton. IMPRESSION: 1. Two small nonobstructive calculi in the lower pole collecting system of left kidney measuring up to 3 mm. No ureteral stones or findings of urinary tract obstruction are noted at this time. 2. Normal appendix. Electronically Signed   By: Trudie Reed M.D.   On: 11/24/2016 21:35   ASSESSMENT AND PLAN:  Brandon Allen  is a 36 y.o. male who presents with  cyclic vomiting syndrome. Patient has been here multiple times in the past with the same, and has known cannabis use. He does have a history of kidney stones, and has two kidney stones seen within his kidney , but none see in his ureter , and  Hydronephrosis or other signs of having passed a stone  1.Cyclic vomiting syndrome - prn antiemetics, IV fluids, likely secondary to cannabis use -Urine toxin positive for cocaine , marijuana, opiates, tricyclic antidepressant  -Patient counseled on cessation of using these drugs. cannabis cessation counseled -No more vomiting. Start clear liquids. Advance to full liquid diet -DC IV pain meds. When necessary pain meds.  2.  HTN (hypertension) - continue home meds  3.  Right flank pain chronic without obstruction- prn analgesics -No evidence of hydronephrosis.  4.  Leukocytosis - suspect  hemoconcentration, recheck AM value after hydration  5.  GERD (gastroesophageal reflux disease) - home dose PPI  Patient continues to improve or DC him in the afternoon.  Case discussed with Care Management/Social Worker. Management plans discussed with the patient, family and they are in agreement.  CODE STATUS: full  DVT Prophylaxis: lovenox  TOTAL TIME TAKING CARE OF THIS PATIENT: *30* minutes.  >50% time spent on counselling and coordination of care  POSSIBLE D/C IN 1-2 DAYS, DEPENDING ON CLINICAL CONDITION.  Note: This dictation was prepared with Dragon dictation along with smaller phrase technology. Any transcriptional errors that result from this process are unintentional.  Lachrista Heslin M.D on 11/25/2016 at 1:01 PM  Between 7am to 6pm - Pager - [phone]  After 6pm go to [email or web address] - Social research officer, government  [facility]  Office  [phone]  CC: Primary care physician; No PCP Per Patient

## 2016-11-25 NOTE — Progress Notes (Deleted)
SATURATION QUALIFICATIONS: (This note is used to comply with regulatory documentation for home oxygen)  Patient Saturations on Room Air at Rest = 86%  Patient Saturations on Room Air while Ambulating = 80%  Patient Saturations on 1 Liters of oxygen while Ambulating = 92%  Please briefly explain why patient needs home oxygen:  lung cancer

## 2016-11-25 NOTE — Plan of Care (Signed)
Problem: Physical Regulation: Goal: Ability to maintain clinical measurements within normal limits will improve Outcome: Not Progressing Blood pressure elevated at 196/120. Spoke with Dr Marcille Blanco and nitro paste ordered.  Problem: Fluid Volume: Goal: Ability to maintain a balanced intake and output will improve Outcome: Not Applicable Date Met: 75/44/92 NPO  Problem: Nutrition: Goal: Adequate nutrition will be maintained Outcome: Not Applicable Date Met: 01/00/71 NPO

## 2016-11-25 NOTE — Progress Notes (Signed)
Patient's blood pressure stilltoo high for discharge Increased hydralazine  Suspend d/c today

## 2016-11-25 NOTE — Discharge Summary (Signed)
[facility] - [place] at 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 Atrial fibrillation (HCC)    Dr Mariah Milling  . 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 Deafness in right ear   . HTN (hypertension) 12/20/2014  . Kidney stones   . NSAID induced gastritis     HOSPITAL COURSE:   Brandon Allen a 36 y.o.malewho presents with cyclic vomiting syndrome. Patient has been here multiple times in the past with the same, and has known cannabis use. He does have a history of kidney stones, and has two kidney stones seen within his kidney , but none see in his ureter , and Hydronephrosis or other signs of having passed a stone  1.Cyclic vomiting syndrome - prn antiemetics, received IV fluids, likely secondary to cannabis use -Urine toxin positive for cocaine , marijuana, opiates, tricyclic antidepressant  -Patient counseled on cessation of using these drugs. cannabis cessation counseled -No more vomiting. Start clear liquids. Advance to full liquid diet -DC IV pain meds. When necessary oral pain meds.  2.HTN (hypertension) - continue home meds  3.Right flank pain chronic without obstruction- prn analgesics -No evidence of hydronephrosis.  4.Leukocytosis - suspect hemoconcentration -20K--16 K  5.GERD (gastroesophageal reflux disease) - home dose PPI  Patient advised full liquid diet for a few days and  advance to soft. He's also recommended follow-up with primary care physician in the area. CONSULTS OBTAINED:    DRUG ALLERGIES:   Allergies  Allergen Reactions  . Morphine And Related Nausea And Vomiting    DISCHARGE MEDICATIONS:   Current Discharge Medication List    START taking these medications   Details  amLODipine (NORVASC) 10 MG tablet Take 1 tablet (10 mg total) by mouth daily. Qty: 30 tablet, Refills: 0    hydrALAZINE (APRESOLINE) 25 MG tablet Take 1 tablet (25 mg total) by mouth 3 (three) times daily. Qty: 90 tablet, Refills: 0    hydrochlorothiazide (HYDRODIURIL) 25 MG tablet Take 1 tablet (25 mg total) by mouth daily. Qty: 30 tablet, Refills: 0    ondansetron (ZOFRAN) 4 MG tablet Take 1 tablet (4 mg total) by mouth every 8 (eight) hours as needed for nausea. Qty: 20 tablet, Refills: 0    sucralfate (CARAFATE) 1 g tablet Take 1 tablet (1 g total) by mouth 4 (four) times daily -  with meals and at bedtime. Qty: 90 tablet, Refills: 0    traMADol (ULTRAM) 50 MG tablet Take 1 tablet (50 mg total) by mouth every 6 (six) hours as needed. Qty: 15 tablet, Refills: 0      CONTINUE these medications which have CHANGED   Details  pantoprazole (PROTONIX) 40 MG tablet Take 1 tablet (40 mg total) by mouth 2 (two) times daily. Qty: 30 tablet, Refills: 0      CONTINUE these medications which have NOT CHANGED   Details  promethazine (PHENERGAN) 25 MG suppository Place 1 suppository (25 mg total) rectally every 6 (six) hours as needed for  nausea. Qty: 12 suppository, Refills: 1        If you experience worsening of your admission symptoms, develop shortness of breath, life threatening emergency, suicidal or homicidal thoughts you must seek medical attention immediately by calling 911 or calling your MD immediately  if symptoms less severe.  You Must read complete instructions/literature along with all the possible adverse reactions/side effects for all the Medicines you  take and that have been prescribed to you. Take any new Medicines after you have completely understood and accept all the possible adverse reactions/side effects.   Please note  You were cared for by a hospitalist during your hospital stay. If you have any questions about your discharge medications or the care you received while you were in the hospital after you are discharged, you can call the unit and asked to speak with the hospitalist on call if the hospitalist that took care of you is not available. Once you are discharged, your primary care physician will handle any further medical issues. Please note that NO REFILLS for any discharge medications will be authorized once you are discharged, as it is imperative that you return to your primary care physician (or establish a relationship with a primary care physician if you do not have one) for your aftercare needs so that they can reassess your need for medications and monitor your lab values.   DATA REVIEW:   CBC   Recent Labs Lab 11/25/16 0701  WBC 16.0*  HGB 16.3  HCT 47.2  PLT 235    Chemistries   Recent Labs Lab 11/24/16 2050 11/25/16 0701  NA 141 140  K 3.1* 3.8  CL 106 112*  CO2 21* 22  GLUCOSE 157* 132*  BUN 9 10  CREATININE 1.37* 1.13  CALCIUM 10.7* 9.7  AST 28  --   ALT 21  --   ALKPHOS 64  --   BILITOT 1.1  --     Microbiology Results   No results found for this or any previous visit (from the past 240 hour(s)).  RADIOLOGY:  Ct Renal Stone Study  Result Date: 11/24/2016 CLINICAL DATA:  36 year old male with history of a vomiting, right flank and epigastric pain for the past 2 hours. Diaphoresis. EXAM: CT ABDOMEN AND PELVIS WITHOUT CONTRAST TECHNIQUE: Multidetector CT imaging of the abdomen and pelvis was performed following the standard protocol without IV contrast. COMPARISON:  CT the abdomen and pelvis 09/25/2016. FINDINGS: Lower chest: Unremarkable. Hepatobiliary: No definite cystic or solid hepatic  lesion is identified on today's noncontrast CT examination. Gallbladder is not visualized, presumably surgically absent (no surgical clips are noted in the gallbladder fossa). Pancreas: No pancreatic mass or peripancreatic inflammatory changes are noted on today's noncontrast CT examination. Spleen: Unremarkable. Adrenals/Urinary Tract: 2 mm and 3 mm nonobstructive calculi are present in the lower pole collecting system of the left kidney. No additional calculi are noted within the collecting system of the right kidney, along the course of either ureter, or within the lumen of the urinary bladder. There is no hydroureteronephrosis. Unenhanced appearance of the urinary bladder is normal. Bilateral adrenal glands are normal in appearance. Stomach/Bowel: The appearance of the stomach is normal. There is no pathologic dilatation of small bowel or colon. Normal appendix. Vascular/Lymphatic: No atherosclerotic calcifications or definite aneurysm noted in the abdominal or pelvic vasculature. No lymphadenopathy noted in the abdomen or pelvis. Reproductive: Prostate gland and seminal vesicles are unremarkable in appearance. Other: No significant volume of ascites.  No pneumoperitoneum. Musculoskeletal: There  are no aggressive appearing lytic or blastic lesions noted in the visualized portions of the skeleton. IMPRESSION: 1. Two small nonobstructive calculi in the lower pole collecting system of left kidney measuring up to 3 mm. No ureteral stones or findings of urinary tract obstruction are noted at this time. 2. Normal appendix. Electronically Signed   By: Trudie Reed M.D.   On: 11/24/2016 21:35     Management plans discussed with the patient, family and they are in agreement.  CODE STATUS:     Code Status Orders        Start     Ordered   11/25/16 0128  Full code  Continuous     11/25/16 0127    Code Status History    Date Active Date Inactive Code Status Order ID Comments User Context   06/23/2016  10:48 AM 06/25/2016  8:27 PM Full Code [id number]  Arnaldo Natal, MD Inpatient   01/25/2016  8:57 PM 01/26/2016  4:20 PM Full Code [id number]  Arnaldo Natal, MD Inpatient   12/20/2014  3:27 AM 12/21/2014  6:47 PM Full Code [id number]  Wyatt Haste, MD ED      TOTAL TIME TAKING CARE OF THIS PATIENT: *40* minutes.    Brandon Allen M.D on 11/25/2016 at 2:59 PM  Between 7am to 6pm - Pager - [phone] After 6pm go to [email or web address] - Social research officer, government  [facility]  Office  [phone]  CC: Primary care physician; No PCP Per Patient

## 2016-11-26 ENCOUNTER — Telehealth: Payer: Self-pay | Admitting: Pharmacist

## 2016-11-26 MED ORDER — HYDRALAZINE HCL 20 MG/ML IJ SOLN: 10.0000 mg | Freq: Four times a day (QID) | INTRAMUSCULAR | Status: DC | PRN

## 2016-11-26 MED ORDER — TRAMADOL HCL 50 MG PO TABS: 50.0000 mg | ORAL_TABLET | Freq: Three times a day (TID) | ORAL | 0 refills | Status: DC | PRN

## 2016-11-26 MED ORDER — CLONIDINE HCL 0.1 MG PO TABS
0.2000 mg | ORAL_TABLET | Freq: Two times a day (BID) | ORAL | Status: DC
  Administered 2016-11-26: 0.2 mg via ORAL
  Filled 2016-11-26: qty 2

## 2016-11-26 MED ORDER — CLONIDINE HCL 0.2 MG PO TABS: 0.2000 mg | ORAL_TABLET | Freq: Two times a day (BID) | ORAL | 2 refills | Status: DC

## 2016-11-26 NOTE — Progress Notes (Signed)
Pt is being discharged home. Discharge papers given and explained to pt. Pt verbalized understanding. Meds and f/u appointment reviewed with pt. RX given. Awaiting transportation. 

## 2016-11-26 NOTE — Telephone Encounter (Signed)
Called Dr. Allena Katz to clarify clonidine dose. Rx's faxed over to [facility] earlier today and not easily readable.  Looked in [facility] to see verify and saw that the Rx's were sent to [facility].  Called [facility] and had Rx's transferred to [facility] for clonidine which was 0.2 mg twice daily.  Pt brought written Rx to [facility] for clonidine 0.1 mg twice daily. Dr. Allena Katz verbally Okay'ed clonidine 0.2 mg twice daily. Pt picked up the 0.2 mg tablets today.  Unice Vantassel K. Joelene Millin, PharmD [facility] Operations Coordinator [phone]

## 2016-11-26 NOTE — Discharge Instructions (Signed)
°  Abstinence from marijuana and cocaine Keep log of your blood pressure Establish primary care physician

## 2016-11-26 NOTE — Discharge Summary (Addendum)
[facility] [place] at [facility]   PATIENT NAME: Brandon Allen    MR#:  [id number]  DATE OF BIRTH:  04-08-1981  DATE OF ADMISSION:  11/24/2016 ADMITTING PHYSICIAN: Oralia Manis, MD  DATE OF DISCHARGE: 11/25/2016  PRIMARY CARE PHYSICIAN: No PCP Per Patient    ADMISSION DIAGNOSIS:  Intractable cyclical vomiting with nausea [G43.A1]  DISCHARGE DIAGNOSIS:  Recurrent intractable nausea vomiting Cyclical vomiting syndrome suspected due to cannabinoid use Urine drug screen positive for cocaine and cannabinoid Uncontrolled hypertension SECONDARY DIAGNOSIS:   Past Medical History:  Diagnosis Date  . Atrial fibrillation (HCC)    Dr Mariah Milling  . 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 Deafness in right ear   . HTN (hypertension) 12/20/2014  . Kidney stones   . NSAID induced gastritis     HOSPITAL COURSE:   KevinSileris a 36 y.o.malewho presents with cyclic vomiting syndrome. Patient has been here multiple times in the past with the same, and has known cannabis use. He does have a history of kidney stones, and has two kidney stones seen within his kidney , but none see in his ureter , and Hydronephrosis or other signs of having passed a stone  1.Cyclic vomiting syndrome - prn antiemetics, received IV fluids, likely secondary to cannabis use -Urine toxin positive for cocaine , marijuana, opiates, tricyclic antidepressant  -Patient counseled on cessation of using these drugs. cannabis cessation counseled -No more vomiting. Start clear liquids. Advance to full liquid diet  2.HTN (hypertension) meds -clonidine, HCTZ and amlodipine  3.Right flank pain chronic without obstruction- prn analgesics -No evidence of hydronephrosis.  4.Leukocytosis - suspect hemoconcentration -20K--16 K  5.GERD (gastroesophageal reflux disease) - home dose PPI  Patient advised full liquid diet for a few days and advance to soft. [facility]  Feels better D/c home CONSULTS OBTAINED:    DRUG ALLERGIES:   Allergies  Allergen Reactions  . Morphine And Related Nausea And Vomiting    DISCHARGE MEDICATIONS:   Current Discharge Medication List    START taking these medications   Details  amLODipine (NORVASC) 10 MG tablet Take 1 tablet (10 mg total) by mouth daily. Qty: 30 tablet, Refills: 0    cloNIDine (CATAPRES) 0.2 MG tablet Take 1 tablet (0.2 mg total) by mouth 2 (two) times daily. Qty: 60 tablet, Refills: 2    hydrochlorothiazide (HYDRODIURIL) 25 MG tablet Take 1 tablet (25 mg total) by mouth daily. Qty: 30 tablet, Refills: 0    ondansetron (ZOFRAN) 4 MG tablet Take 1 tablet (4 mg total) by mouth every 8 (eight) hours as needed for nausea. Qty: 20 tablet, Refills: 0    sucralfate (CARAFATE) 1 g tablet Take 1 tablet (1 g total) by mouth 4 (four) times daily -  with meals and at bedtime. Qty: 90 tablet, Refills: 0    traMADol (ULTRAM) 50 MG tablet Take 1 tablet (50 mg total) by mouth 3 (three) times daily as needed. Qty: 15 tablet, Refills: 0      CONTINUE these medications which have CHANGED   Details  pantoprazole (PROTONIX) 40 MG tablet Take 1 tablet (40 mg total) by mouth 2 (two) times daily. Qty: 30 tablet, Refills: 0      CONTINUE these medications which have NOT CHANGED   Details  promethazine (PHENERGAN) 25 MG suppository Place 1 suppository (25 mg total) rectally every 6 (six) hours as needed for nausea. Qty: 12 suppository, Refills: 1  If you experience worsening of your admission symptoms, develop shortness of breath, life threatening emergency, suicidal or homicidal thoughts you must seek medical attention immediately by calling 911 or calling your MD immediately  if symptoms less severe.  You Must read complete instructions/literature along with all the possible adverse reactions/side effects for all the Medicines you take and that have been  prescribed to you. Take any new Medicines after you have completely understood and accept all the possible adverse reactions/side effects.   Please note  You were cared for by a hospitalist during your hospital stay. If you have any questions about your discharge medications or the care you received while you were in the hospital after you are discharged, you can call the unit and asked to speak with the hospitalist on call if the hospitalist that took care of you is not available. Once you are discharged, your primary care physician will handle any further medical issues. Please note that NO REFILLS for any discharge medications will be authorized once you are discharged, as it is imperative that you return to your primary care physician (or establish a relationship with a primary care physician if you do not have one) for your aftercare needs so that they can reassess your need for medications and monitor your lab values.   DATA REVIEW:   CBC   Recent Labs Lab 11/25/16 0701  WBC 16.0*  HGB 16.3  HCT 47.2  PLT 235    Chemistries   Recent Labs Lab 11/24/16 2050 11/25/16 0701  NA 141 140  K 3.1* 3.8  CL 106 112*  CO2 21* 22  GLUCOSE 157* 132*  BUN 9 10  CREATININE 1.37* 1.13  CALCIUM 10.7* 9.7  AST 28  --   ALT 21  --   ALKPHOS 64  --   BILITOT 1.1  --     Microbiology Results   No results found for this or any previous visit (from the past 240 hour(s)).  RADIOLOGY:  Ct Renal Stone Study  Result Date: 11/24/2016 CLINICAL DATA:  36 year old male with history of a vomiting, right flank and epigastric pain for the past 2 hours. Diaphoresis. EXAM: CT ABDOMEN AND PELVIS WITHOUT CONTRAST TECHNIQUE: Multidetector CT imaging of the abdomen and pelvis was performed following the standard protocol without IV contrast. COMPARISON:  CT the abdomen and pelvis 09/25/2016. FINDINGS: Lower chest: Unremarkable. Hepatobiliary: No definite cystic or solid hepatic lesion is identified on  today's noncontrast CT examination. Gallbladder is not visualized, presumably surgically absent (no surgical clips are noted in the gallbladder fossa). Pancreas: No pancreatic mass or peripancreatic inflammatory changes are noted on today's noncontrast CT examination. Spleen: Unremarkable. Adrenals/Urinary Tract: 2 mm and 3 mm nonobstructive calculi are present in the lower pole collecting system of the left kidney. No additional calculi are noted within the collecting system of the right kidney, along the course of either ureter, or within the lumen of the urinary bladder. There is no hydroureteronephrosis. Unenhanced appearance of the urinary bladder is normal. Bilateral adrenal glands are normal in appearance. Stomach/Bowel: The appearance of the stomach is normal. There is no pathologic dilatation of small bowel or colon. Normal appendix. Vascular/Lymphatic: No atherosclerotic calcifications or definite aneurysm noted in the abdominal or pelvic vasculature. No lymphadenopathy noted in the abdomen or pelvis. Reproductive: Prostate gland and seminal vesicles are unremarkable in appearance. Other: No significant volume of ascites.  No pneumoperitoneum. Musculoskeletal: There are no aggressive appearing lytic or blastic lesions noted in the visualized portions  of the skeleton. IMPRESSION: 1. Two small nonobstructive calculi in the lower pole collecting system of left kidney measuring up to 3 mm. No ureteral stones or findings of urinary tract obstruction are noted at this time. 2. Normal appendix. Electronically Signed   By: Trudie Reed M.D.   On: 11/24/2016 21:35     Management plans discussed with the patient, family and they are in agreement.  CODE STATUS:     Code Status Orders        Start     Ordered   11/25/16 0128  Full code  Continuous     11/25/16 0127    Code Status History    Date Active Date Inactive Code Status Order ID Comments User Context   06/23/2016 10:48 AM 06/25/2016   8:27 PM Full Code [id number]  Arnaldo Natal, MD Inpatient   01/25/2016  8:57 PM 01/26/2016  4:20 PM Full Code [id number]  Arnaldo Natal, MD Inpatient   12/20/2014  3:27 AM 12/21/2014  6:47 PM Full Code [id number]  Wyatt Haste, MD ED      TOTAL TIME TAKING CARE OF THIS PATIENT: *40* minutes.    Zlaty Alexa M.D on 11/26/2016 at 12:03 PM  Between 7am to 6pm - Pager - [phone] After 6pm go to [email or web address] - Social research officer, government  [facility] [place] [facility]  Office  [phone]  CC: Primary care physician; No PCP Per Patient

## 2016-11-26 NOTE — Care Management (Signed)
Admitted to this facility under observation status with the diagnosis vomiting. Lives with his mother, [name]. Grandmother is helen Graves [phone]). Last physician seen was Dr. Thurmond Butts many years ago. Works part time at [employer] on [address], Takes care of all basic activities of daily living himself, drives. A resident of [place]. Never been to the [facility]. No Medical Insurance.  Prescriptions for Amlodipine  every day, HCTZ  every day, and Clonidine 0.1mg   BID faxed to [facility]. Discussed [facility]. Applications give. Smiley Houseman, [facility] representative updated Discharge to home today per Dr. Theodoro Doing s Marcelle Overlie RN MSN CCM Care Management [phone]

## 2017-01-13 ENCOUNTER — Ambulatory Visit: Payer: Self-pay | Admitting: Pharmacy Technician

## 2017-01-13 NOTE — Progress Notes (Signed)
Patient scheduled for eligibility appointment at [facility].  Patient did not show for the appointment on January 13, 2017 at 9:00a.m.  Patient did not reschedule eligibility appointment.  [facility] to provide additional [facility] assistance until eligibility is determined.  Sherilyn DacostaBetty J. Kluttz Care Manager [facility]

## 2017-02-19 ENCOUNTER — Ambulatory Visit: Payer: Medicaid Other | Admitting: Pharmacy Technician

## 2017-02-19 NOTE — Progress Notes (Signed)
Patient scheduled for eligibility appointment at [facility].  Patient did not show for the appointment on February 19, 2017 at 9:00a.m.  Patient d eligibility appointment to 7/25 at 2:00p.m.Marland Kitchen.  [facility] unable to provide additional [facility] assistance until eligibility is determined.  Sherilyn DacostaBetty J. Joseluis Alessio Care Manager [facility]

## 2017-02-24 ENCOUNTER — Encounter (INDEPENDENT_AMBULATORY_CARE_PROVIDER_SITE_OTHER): Payer: Self-pay

## 2017-02-24 ENCOUNTER — Ambulatory Visit: Payer: Medicaid Other | Admitting: Pharmacy Technician

## 2017-02-24 DIAGNOSIS — Z79899 Other long term (current) drug therapy: Secondary | ICD-10-CM

## 2017-03-04 NOTE — Progress Notes (Signed)
Met with patient completed financial assistance application for [facility] due to recent hospital visit.  Patient agreed to be responsible for gathering financial information and forwarding to appropriate department in [facility].    Completed [facility] application and contract.  Patient agreed to all terms of the [facility] contract.  Patient to provide next pay stub.  Provided patient with Civil engineer, contracting based on his particular needs.    Patient approved to receive medication assistance at [facility] through 2018, as long as eligibility criteria continues to be met.  [facility]

## 2017-03-20 ENCOUNTER — Inpatient Hospital Stay
Admission: EM | Admit: 2017-03-20 | Discharge: 2017-03-22 | DRG: 305 | Disposition: A | Discharge status: Home / Self Care | Payer: Medicaid Other | Attending: Internal Medicine | Admitting: Internal Medicine

## 2017-03-20 ENCOUNTER — Emergency Department: Payer: Medicaid Other

## 2017-03-20 DIAGNOSIS — E876 Hypokalemia: Secondary | ICD-10-CM | POA: Diagnosis present

## 2017-03-20 DIAGNOSIS — R1115 Cyclical vomiting syndrome unrelated to migraine: Secondary | ICD-10-CM

## 2017-03-20 DIAGNOSIS — D72829 Elevated white blood cell count, unspecified: Secondary | ICD-10-CM | POA: Diagnosis present

## 2017-03-20 DIAGNOSIS — K219 Gastro-esophageal reflux disease without esophagitis: Secondary | ICD-10-CM | POA: Diagnosis present

## 2017-03-20 DIAGNOSIS — H9191 Unspecified hearing loss, right ear: Secondary | ICD-10-CM | POA: Diagnosis present

## 2017-03-20 DIAGNOSIS — I10 Essential (primary) hypertension: Secondary | ICD-10-CM | POA: Diagnosis present

## 2017-03-20 DIAGNOSIS — R112 Nausea with vomiting, unspecified: Secondary | ICD-10-CM | POA: Diagnosis present

## 2017-03-20 DIAGNOSIS — F17210 Nicotine dependence, cigarettes, uncomplicated: Secondary | ICD-10-CM | POA: Diagnosis present

## 2017-03-20 DIAGNOSIS — F1210 Cannabis abuse, uncomplicated: Secondary | ICD-10-CM | POA: Diagnosis present

## 2017-03-20 DIAGNOSIS — Z9114 Patient's other noncompliance with medication regimen: Secondary | ICD-10-CM

## 2017-03-20 DIAGNOSIS — I160 Hypertensive urgency: Secondary | ICD-10-CM | POA: Diagnosis present

## 2017-03-20 DIAGNOSIS — K3189 Other diseases of stomach and duodenum: Secondary | ICD-10-CM | POA: Diagnosis present

## 2017-03-20 LAB — URINALYSIS, COMPLETE (UACMP) WITH MICROSCOPIC
BACTERIA UA: NONE SEEN
Bilirubin Urine: NEGATIVE
GLUCOSE, UA: NEGATIVE mg/dL
HGB URINE DIPSTICK: NEGATIVE
Ketones, ur: 80 mg/dL — AB
NITRITE: NEGATIVE
Protein, ur: 100 mg/dL — AB
Squamous Epithelial / LPF: NONE SEEN
pH: 6 (ref 5.0–8.0)

## 2017-03-20 LAB — LACTIC ACID, PLASMA: Lactic Acid, Venous: 1.9 mmol/L (ref 0.5–1.9)

## 2017-03-20 LAB — COMPREHENSIVE METABOLIC PANEL
ALK PHOS: 61 U/L (ref 38–126)
ALT: 24 U/L (ref 17–63)
AST: 31 U/L (ref 15–41)
Albumin: 5.4 g/dL — ABNORMAL HIGH (ref 3.5–5.0)
Anion gap: 14 (ref 5–15)
BUN: 15 mg/dL (ref 6–20)
CALCIUM: 11.2 mg/dL — AB (ref 8.9–10.3)
CO2: 25 mmol/L (ref 22–32)
CREATININE: 1.08 mg/dL (ref 0.61–1.24)
Chloride: 102 mmol/L (ref 101–111)
Glucose, Bld: 186 mg/dL — ABNORMAL HIGH (ref 65–99)
Potassium: 3.3 mmol/L — ABNORMAL LOW (ref 3.5–5.1)
Sodium: 141 mmol/L (ref 135–145)
Total Bilirubin: 1.4 mg/dL — ABNORMAL HIGH (ref 0.3–1.2)
Total Protein: 9.2 g/dL — ABNORMAL HIGH (ref 6.5–8.1)

## 2017-03-20 LAB — CBC WITH DIFFERENTIAL/PLATELET
BASOS PCT: 0 %
Basophils Absolute: 0.1 10*3/uL (ref 0–0.1)
EOS ABS: 0.2 10*3/uL (ref 0–0.7)
Eosinophils Relative: 1 %
HCT: 51.8 % (ref 40.0–52.0)
Hemoglobin: 17.8 g/dL (ref 13.0–18.0)
Lymphocytes Relative: 16 %
Lymphs Abs: 3.4 10*3/uL (ref 1.0–3.6)
MCH: 31.2 pg (ref 26.0–34.0)
MCHC: 34.4 g/dL (ref 32.0–36.0)
MCV: 90.6 fL (ref 80.0–100.0)
MONO ABS: 1.5 10*3/uL — AB (ref 0.2–1.0)
MONOS PCT: 7 %
Neutro Abs: 16.1 10*3/uL — ABNORMAL HIGH (ref 1.4–6.5)
Neutrophils Relative %: 76 %
Platelets: 220 10*3/uL (ref 150–440)
RBC: 5.71 MIL/uL (ref 4.40–5.90)
RDW: 12.1 % (ref 11.5–14.5)
WBC: 21.3 10*3/uL — ABNORMAL HIGH (ref 3.8–10.6)

## 2017-03-20 LAB — TROPONIN I

## 2017-03-20 LAB — LIPASE, BLOOD: LIPASE: 27 U/L (ref 11–51)

## 2017-03-20 MED ORDER — ONDANSETRON HCL 4 MG/2ML IJ SOLN
4.0000 mg | Freq: Once | INTRAMUSCULAR | Status: AC
  Administered 2017-03-20: 4 mg via INTRAVENOUS
  Filled 2017-03-20: qty 2

## 2017-03-20 MED ORDER — ONDANSETRON HCL 4 MG/2ML IJ SOLN
INTRAMUSCULAR | Status: AC
  Filled 2017-03-20: qty 4

## 2017-03-20 MED ORDER — FENTANYL CITRATE (PF) 100 MCG/2ML IJ SOLN
12.5000 ug | Freq: Once | INTRAMUSCULAR | Status: AC
  Administered 2017-03-20: 12.5 ug via INTRAVENOUS
  Filled 2017-03-20: qty 2

## 2017-03-20 MED ORDER — PROCHLORPERAZINE EDISYLATE 5 MG/ML IJ SOLN
10.0000 mg | Freq: Once | INTRAMUSCULAR | Status: AC
  Administered 2017-03-20: 10 mg via INTRAVENOUS
  Filled 2017-03-20: qty 2

## 2017-03-20 MED ORDER — LABETALOL HCL 5 MG/ML IV SOLN
10.0000 mg | Freq: Once | INTRAVENOUS | Status: AC
  Administered 2017-03-20: 10 mg via INTRAVENOUS
  Filled 2017-03-20: qty 4

## 2017-03-20 MED ORDER — SODIUM CHLORIDE 0.9 % IV BOLUS (SEPSIS)
1000.0000 mL | Freq: Once | INTRAVENOUS | Status: AC
  Administered 2017-03-20: 1000 mL via INTRAVENOUS

## 2017-03-20 MED ORDER — SODIUM CHLORIDE 0.9 % IV SOLN: 8.0000 mg | Freq: Once | INTRAVENOUS | Status: DC

## 2017-03-20 MED ORDER — IOPAMIDOL (ISOVUE-300) INJECTION 61%
100.0000 mL | Freq: Once | INTRAVENOUS | Status: AC | PRN
  Administered 2017-03-20: 100 mL via INTRAVENOUS

## 2017-03-20 MED ORDER — IOPAMIDOL (ISOVUE-300) INJECTION 61%
30.0000 mL | Freq: Once | INTRAVENOUS | Status: AC
  Administered 2017-03-20: 30 mL via ORAL

## 2017-03-20 NOTE — ED Triage Notes (Signed)
Pt presents via POV c/o generalized abd pain and mid sternal chest pain. Pt drenched in sweat upon arrival to triage wrapped in towels and blanket.  Reports symptoms began at apx 1630.

## 2017-03-20 NOTE — ED Provider Notes (Signed)
[facility] Emergency Department Provider Note   ____________________________________________   First MD Initiated Contact with Patient 03/20/17 [phone]     (approximate)  I have reviewed the triage vital signs and the nursing notes.   HISTORY  Chief Complaint Abdominal Pain   HPI Brandon Allen is a 36 y.o. male patient reports generalized abdominal pain and chest pain began about 1630. Patient is still sweaty when I saw him. He is vomiting very loudly. Belly is very tender. White count is 21,000 which is higher than its been in the past. He's had Zofran and will take Compazine and some fentanyl for the pain. 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 Atrial fibrillation (HCC)    Dr Mariah Milling  . 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 Deafness in right ear   . HTN (hypertension) 12/20/2014  . Kidney stones   . NSAID induced gastritis     Patient Active Problem List   Diagnosis Date Noted  . GERD (gastroesophageal reflux disease) 11/24/2016  . Leukocytosis 01/26/2016  . Hyperglycemia 01/26/2016  . Essential hypertension 01/26/2016  . Cannabis abuse 01/26/2016  . Cyclic vomiting syndrome 01/25/2016  . Atrial fibrillation with rapid ventricular response (HCC) 12/20/2014  . HTN (hypertension) 12/20/2014    Past Surgical History:  Procedure Laterality Date  . CHOLECYSTECTOMY    . ESOPHAGOGASTRODUODENOSCOPY    . ESOPHAGOGASTRODUODENOSCOPY  11/2011   NSAID-induced gastritis (Dr Bluford Kaufmann)  . ESOPHAGOGASTRODUODENOSCOPY N/A 06/24/2016   Procedure: ESOPHAGOGASTRODUODENOSCOPY (EGD);  Surgeon: Scot Jun, MD;  Location: [facility] ENDOSCOPY;  Service: Endoscopy;  Laterality: N/A;  . INNER EAR SURGERY      Prior to Admission medications   Medication Sig Start Date End Date Taking? Authorizing Provider  amLODipine (NORVASC) 10 MG tablet  Take 1 tablet (10 mg total) by mouth daily. 11/26/16   Enedina Finner, MD  cloNIDine (CATAPRES) 0.2 MG tablet Take 1 tablet (0.2 mg total) by mouth 2 (two) times daily. 11/26/16   Enedina Finner, MD  hydrochlorothiazide (HYDRODIURIL) 25 MG tablet Take 1 tablet (25 mg total) by mouth daily. 11/26/16   Enedina Finner, MD  ondansetron (ZOFRAN) 4 MG tablet Take 1 tablet (4 mg total) by mouth every 8 (eight) hours as needed for nausea. 11/25/16   Enedina Finner, MD  pantoprazole (PROTONIX) 40 MG tablet Take 1 tablet (40 mg total) by mouth 2 (two) times daily. 11/25/16   Enedina Finner, MD  promethazine (PHENERGAN) 25 MG suppository Place 1 suppository (25 mg total) rectally every 6 (six) hours as needed for nausea. 09/28/16 09/28/17  Loleta Rose, MD  sucralfate (CARAFATE) 1 g tablet Take 1 tablet (1 g total) by mouth 4 (four) times daily -  with meals and at bedtime. 11/25/16   Enedina Finner, MD  traMADol (ULTRAM) 50 MG tablet Take 1 tablet (50 mg total) by mouth 3 (three) times daily as needed. 11/26/16   Enedina Finner, MD    Allergies Morphine and related  Family History  Problem Relation Age of Onset  . Heart failure Other   . Colon cancer Maternal Grandfather 110    Social History Social History  Substance Use Topics  . Smoking status: Current Every Day Smoker    Packs/day: 0.50    Types: Cigarettes    Start date: 12/19/2009  . Smokeless tobacco: Never Used  . Alcohol use No  Review of Systems  Constitutional: No fever/chills Eyes: No visual changes. ENT: No sore throat. Cardiovascular:  chest pain. Respiratory: Denies shortness of breath. Gastrointestinal:  abdominal pain.   nausea,  vomiting.  No diarrhea.  No constipation. Genitourinary: Negative for dysuria. Musculoskeletal: Negative for back pain. Skin: Negative for rash. Neurological: Negative for headaches, focal weakness  ____________________________________________   PHYSICAL EXAM:  VITAL SIGNS: ED Triage Vitals [03/20/17 1902]  Enc  Vitals Group     BP (!) 116/47     Pulse Rate (!) 117     Resp (!) 22     Temp 97.8 F (36.6 C)     Temp Source Oral     SpO2 99 %     Weight      Height      Head Circumference      Peak Flow      Pain Score 10     Pain Loc      Pain Edu?      Excl. in GC?    Constitutional: Alert and oriented. ill appearing and in  acute distress. Eyes: Conjunctivae are normal.  Head: Atraumatic. Nose: No congestion/rhinnorhea. Mouth/Throat: Mucous membranes are moist.  Oropharynx non-erythematous. Neck: No stridor.   Cardiovascular: Normal rate, regular rhythm. Grossly normal heart sounds.  Good peripheral circulation. Respiratory: Normal respiratory effort.  No retractions. Lungs CTAB. Gastrointestinal: Firm diffusely tender. No distention. No abdominal bruits. No CVA tenderness. Bowel sounds sound normal Musculoskeletal: No lower extremity tenderness nor edema.  No joint effusions. Neurologic:  Normal speech and language. No gross focal neurologic deficits are appreciated.  Skin:  Skin is warm, dry and intact. No rash noted. Psychiatric: Mood and affect are normal. Speech and behavior are normal.  ____________________________________________   LABS (all labs ordered are listed, but only abnormal results are displayed)  Labs Reviewed  COMPREHENSIVE METABOLIC PANEL - Abnormal; Notable for the following:       Result Value   Potassium 3.3 (*)    Glucose, Bld 186 (*)    Calcium 11.2 (*)    Total Protein 9.2 (*)    Albumin 5.4 (*)    Total Bilirubin 1.4 (*)    All other components within normal limits  CBC WITH DIFFERENTIAL/PLATELET - Abnormal; Notable for the following:    WBC 21.3 (*)    Neutro Abs 16.1 (*)    Monocytes Absolute 1.5 (*)    All other components within normal limits  URINALYSIS, COMPLETE (UACMP) WITH MICROSCOPIC - Abnormal; Notable for the following:    Color, Urine YELLOW (*)    APPearance CLEAR (*)    Specific Gravity, Urine >1.046 (*)    Ketones, ur 80 (*)      Protein, ur 100 (*)    Leukocytes, UA TRACE (*)    All other components within normal limits  LIPASE, BLOOD  TROPONIN I  LACTIC ACID, PLASMA  LACTIC ACID, PLASMA   ____________________________________________  EKG  EKG read and interpreted by me shows sinus tachycardia rate of 104 normal axis no acute ST-T wave changes ____________________________________________  RADIOLOGY   _IMPRESSION: Unremarkable contrast-enhanced CT of the abdomen and pelvis   Electronically Signed   By: Roanna Raider M.D.   On: 03/20/2017 22:53___________________________________________   PROCEDURES  Procedure(s) performed:   Procedures  Critical Care performed:   ____________________________________________   INITIAL IMPRESSION / ASSESSMENT AND PLAN / ED COURSE  Pertinent labs & imaging results that were available during my care of the patient were reviewed by  me and considered in my medical decision making (see chart for details).        ____________________________________________   FINAL CLINICAL IMPRESSION(S) / ED DIAGNOSES  Final diagnoses:  Intractable cyclical vomiting with nausea      NEW MEDICATIONS STARTED DURING THIS VISIT:  New Prescriptions   No medications on file     Note:  This document was prepared using Dragon voice recognition software and may include unintentional dictation errors.    Arnaldo Natal, MD 03/20/17 [phone]

## 2017-03-20 NOTE — ED Notes (Signed)
Patient transported to CT 

## 2017-03-20 NOTE — ED Notes (Signed)
Pt is currently vomiting excessively yellow bile and sweating profusely over entire body.

## 2017-03-21 ENCOUNTER — Encounter: Payer: Self-pay | Admitting: Internal Medicine

## 2017-03-21 DIAGNOSIS — D72829 Elevated white blood cell count, unspecified: Secondary | ICD-10-CM | POA: Diagnosis present

## 2017-03-21 DIAGNOSIS — R1084 Generalized abdominal pain: Secondary | ICD-10-CM | POA: Diagnosis present

## 2017-03-21 DIAGNOSIS — E876 Hypokalemia: Secondary | ICD-10-CM | POA: Diagnosis present

## 2017-03-21 DIAGNOSIS — R112 Nausea with vomiting, unspecified: Secondary | ICD-10-CM | POA: Diagnosis present

## 2017-03-21 DIAGNOSIS — Z9114 Patient's other noncompliance with medication regimen: Secondary | ICD-10-CM | POA: Diagnosis not present

## 2017-03-21 DIAGNOSIS — K3189 Other diseases of stomach and duodenum: Secondary | ICD-10-CM | POA: Diagnosis present

## 2017-03-21 DIAGNOSIS — I160 Hypertensive urgency: Secondary | ICD-10-CM | POA: Diagnosis present

## 2017-03-21 DIAGNOSIS — I10 Essential (primary) hypertension: Secondary | ICD-10-CM | POA: Diagnosis present

## 2017-03-21 DIAGNOSIS — H9191 Unspecified hearing loss, right ear: Secondary | ICD-10-CM | POA: Diagnosis present

## 2017-03-21 DIAGNOSIS — F17210 Nicotine dependence, cigarettes, uncomplicated: Secondary | ICD-10-CM | POA: Diagnosis present

## 2017-03-21 DIAGNOSIS — K219 Gastro-esophageal reflux disease without esophagitis: Secondary | ICD-10-CM | POA: Diagnosis present

## 2017-03-21 DIAGNOSIS — F1210 Cannabis abuse, uncomplicated: Secondary | ICD-10-CM | POA: Diagnosis present

## 2017-03-21 LAB — URINE DRUG SCREEN, QUALITATIVE (ARMC ONLY)
AMPHETAMINES, UR SCREEN: NOT DETECTED
BARBITURATES, UR SCREEN: NOT DETECTED
BENZODIAZEPINE, UR SCRN: NOT DETECTED
Cannabinoid 50 Ng, Ur ~~LOC~~: POSITIVE — AB
Cocaine Metabolite,Ur ~~LOC~~: NOT DETECTED
MDMA (Ecstasy)Ur Screen: NOT DETECTED
Methadone Scn, Ur: NOT DETECTED
OPIATE, UR SCREEN: POSITIVE — AB
PHENCYCLIDINE (PCP) UR S: NOT DETECTED
Tricyclic, Ur Screen: NOT DETECTED

## 2017-03-21 LAB — BASIC METABOLIC PANEL
ANION GAP: 8 (ref 5–15)
BUN: 17 mg/dL (ref 6–20)
CALCIUM: 10.3 mg/dL (ref 8.9–10.3)
CO2: 23 mmol/L (ref 22–32)
Chloride: 110 mmol/L (ref 101–111)
Creatinine, Ser: 1 mg/dL (ref 0.61–1.24)
Glucose, Bld: 140 mg/dL — ABNORMAL HIGH (ref 65–99)
POTASSIUM: 3.5 mmol/L (ref 3.5–5.1)
SODIUM: 141 mmol/L (ref 135–145)

## 2017-03-21 LAB — CBC
HCT: 49.1 % (ref 40.0–52.0)
Hemoglobin: 17 g/dL (ref 13.0–18.0)
MCH: 31.4 pg (ref 26.0–34.0)
MCHC: 34.7 g/dL (ref 32.0–36.0)
MCV: 90.6 fL (ref 80.0–100.0)
PLATELETS: 261 10*3/uL (ref 150–440)
RBC: 5.43 MIL/uL (ref 4.40–5.90)
RDW: 12.5 % (ref 11.5–14.5)
WBC: 21 10*3/uL — ABNORMAL HIGH (ref 3.8–10.6)

## 2017-03-21 MED ORDER — KETOROLAC TROMETHAMINE 15 MG/ML IJ SOLN
15.0000 mg | Freq: Four times a day (QID) | INTRAMUSCULAR | Status: DC | PRN
  Filled 2017-03-21: qty 1

## 2017-03-21 MED ORDER — AMLODIPINE BESYLATE 10 MG PO TABS: 10.0000 mg | ORAL_TABLET | Freq: Every day | ORAL | 0 refills | Status: DC

## 2017-03-21 MED ORDER — PANTOPRAZOLE SODIUM 40 MG IV SOLR
40.0000 mg | Freq: Two times a day (BID) | INTRAVENOUS | Status: DC
  Administered 2017-03-21 – 2017-03-22 (×3): 40 mg via INTRAVENOUS
  Filled 2017-03-21 (×3): qty 40

## 2017-03-21 MED ORDER — ACETAMINOPHEN 325 MG PO TABS: 650.0000 mg | ORAL_TABLET | Freq: Four times a day (QID) | ORAL | Status: DC | PRN

## 2017-03-21 MED ORDER — ONDANSETRON HCL 4 MG PO TABS: 4.0000 mg | ORAL_TABLET | Freq: Three times a day (TID) | ORAL | 0 refills | Status: DC | PRN

## 2017-03-21 MED ORDER — KETOROLAC TROMETHAMINE 30 MG/ML IJ SOLN
INTRAMUSCULAR | Status: AC
  Administered 2017-03-21: 15 mg
  Filled 2017-03-21: qty 1

## 2017-03-21 MED ORDER — ONDANSETRON HCL 40 MG/20ML IJ SOLN: 8.0000 mg | Freq: Once | INTRAMUSCULAR | Status: DC

## 2017-03-21 MED ORDER — LABETALOL HCL 5 MG/ML IV SOLN
10.0000 mg | Freq: Once | INTRAVENOUS | Status: AC
Start: 2017-03-21 — End: 2017-03-21
  Administered 2017-03-21: 10 mg via INTRAVENOUS
  Filled 2017-03-21: qty 4

## 2017-03-21 MED ORDER — TRAMADOL HCL 50 MG PO TABS
50.0000 mg | ORAL_TABLET | Freq: Four times a day (QID) | ORAL | Status: DC | PRN
  Administered 2017-03-21 – 2017-03-22 (×2): 50 mg via ORAL
  Filled 2017-03-21 (×3): qty 1

## 2017-03-21 MED ORDER — ENOXAPARIN SODIUM 40 MG/0.4ML ~~LOC~~ SOLN
40.0000 mg | SUBCUTANEOUS | Status: DC
  Administered 2017-03-22: 40 mg via SUBCUTANEOUS
  Filled 2017-03-21: qty 0.4

## 2017-03-21 MED ORDER — POTASSIUM CHLORIDE IN NACL 20-0.9 MEQ/L-% IV SOLN
INTRAVENOUS | Status: DC
  Administered 2017-03-21 (×2): via INTRAVENOUS
  Filled 2017-03-21 (×6): qty 1000

## 2017-03-21 MED ORDER — SUCRALFATE 1 GM/10ML PO SUSP
1.0000 g | Freq: Three times a day (TID) | ORAL | Status: DC
  Administered 2017-03-21 – 2017-03-22 (×5): 1 g via ORAL
  Filled 2017-03-21 (×8): qty 10

## 2017-03-21 MED ORDER — HYDROMORPHONE HCL 1 MG/ML IJ SOLN: 0.5000 mg | Freq: Once | INTRAMUSCULAR | Status: DC

## 2017-03-21 MED ORDER — HYDROMORPHONE HCL 1 MG/ML IJ SOLN
0.5000 mg | Freq: Once | INTRAMUSCULAR | Status: AC
  Administered 2017-03-21: 0.5 mg via INTRAVENOUS
  Filled 2017-03-21: qty 1

## 2017-03-21 MED ORDER — HYDRALAZINE HCL 20 MG/ML IJ SOLN
INTRAMUSCULAR | Status: AC
  Administered 2017-03-21: 10 mg via INTRAVENOUS
  Filled 2017-03-21: qty 1

## 2017-03-21 MED ORDER — KETOROLAC TROMETHAMINE 30 MG/ML IJ SOLN
INTRAMUSCULAR | Status: AC
  Filled 2017-03-21: qty 1

## 2017-03-21 MED ORDER — HYDRALAZINE HCL 20 MG/ML IJ SOLN
10.0000 mg | INTRAMUSCULAR | Status: DC | PRN
  Administered 2017-03-21 (×2): 10 mg via INTRAVENOUS
  Filled 2017-03-21: qty 1

## 2017-03-21 MED ORDER — HYDROMORPHONE HCL 1 MG/ML IJ SOLN
1.0000 mg | INTRAMUSCULAR | Status: DC | PRN
  Administered 2017-03-21: 1 mg via INTRAVENOUS
  Filled 2017-03-21: qty 1

## 2017-03-21 MED ORDER — CLONIDINE HCL 0.1 MG PO TABS: 0.2000 mg | ORAL_TABLET | Freq: Two times a day (BID) | ORAL | Status: DC

## 2017-03-21 MED ORDER — CLONIDINE HCL 0.2 MG PO TABS: 0.2000 mg | ORAL_TABLET | Freq: Two times a day (BID) | ORAL | 0 refills | Status: DC

## 2017-03-21 MED ORDER — ONDANSETRON HCL 4 MG PO TABS: 4.0000 mg | ORAL_TABLET | Freq: Four times a day (QID) | ORAL | Status: DC | PRN

## 2017-03-21 MED ORDER — HYDROCHLOROTHIAZIDE 25 MG PO TABS: 25.0000 mg | ORAL_TABLET | Freq: Every day | ORAL | 0 refills | Status: DC

## 2017-03-21 MED ORDER — ONDANSETRON HCL 4 MG/2ML IJ SOLN
4.0000 mg | Freq: Four times a day (QID) | INTRAMUSCULAR | Status: DC | PRN
  Administered 2017-03-21: 4 mg via INTRAVENOUS
  Filled 2017-03-21: qty 2

## 2017-03-21 MED ORDER — ACETAMINOPHEN 650 MG RE SUPP: 650.0000 mg | Freq: Four times a day (QID) | RECTAL | Status: DC | PRN

## 2017-03-21 MED ORDER — PANTOPRAZOLE SODIUM 40 MG PO TBEC: 40.0000 mg | DELAYED_RELEASE_TABLET | Freq: Two times a day (BID) | ORAL | 0 refills | Status: DC

## 2017-03-21 MED ORDER — LABETALOL HCL 5 MG/ML IV SOLN
10.0000 mg | Freq: Once | INTRAVENOUS | Status: AC
  Administered 2017-03-21: 10 mg via INTRAVENOUS
  Filled 2017-03-21: qty 4

## 2017-03-21 MED ORDER — CLONIDINE HCL 0.1 MG PO TABS
0.2000 mg | ORAL_TABLET | Freq: Two times a day (BID) | ORAL | Status: DC
  Administered 2017-03-21 – 2017-03-22 (×3): 0.2 mg via ORAL
  Filled 2017-03-21 (×3): qty 2

## 2017-03-21 MED ORDER — AMLODIPINE BESYLATE 10 MG PO TABS: 10.0000 mg | ORAL_TABLET | Freq: Every day | ORAL | Status: DC

## 2017-03-21 MED ORDER — TRAMADOL HCL 50 MG PO TABS: 50.0000 mg | ORAL_TABLET | Freq: Three times a day (TID) | ORAL | 0 refills | Status: DC | PRN

## 2017-03-21 MED ORDER — HYDROMORPHONE HCL 1 MG/ML IJ SOLN
0.5000 mg | INTRAMUSCULAR | Status: DC | PRN
  Administered 2017-03-21 – 2017-03-22 (×5): 0.5 mg via INTRAVENOUS
  Filled 2017-03-21 (×6): qty 1

## 2017-03-21 MED ORDER — AMLODIPINE BESYLATE 10 MG PO TABS
10.0000 mg | ORAL_TABLET | Freq: Every day | ORAL | Status: DC
  Administered 2017-03-21 – 2017-03-22 (×2): 10 mg via ORAL
  Filled 2017-03-21 (×2): qty 1

## 2017-03-21 NOTE — ED Notes (Signed)
Transport pt to floor room 245.AS

## 2017-03-21 NOTE — ED Notes (Signed)
RN notified of pt new assigned bed to 2A

## 2017-03-21 NOTE — ED Notes (Signed)
MD Pyreddy spoke with Charge RN Lea and agreed to move pt to 2A floor. Per Clint Lipps MD would change admitting orders. HS called and notified of change in room status. Pt would no longer be going to 1C.

## 2017-03-21 NOTE — ED Notes (Signed)
Report from Leona, Georg Ruddle, rn to give hydralazine for BP.

## 2017-03-21 NOTE — ED Notes (Signed)
Issues getting pt to floor due to BP. RN Charge Lea notified and  Admitting MD paged.

## 2017-03-21 NOTE — H&P (Signed)
[facility] [place] at [facility]   PATIENT NAME: Brandon Allen    MR#:  [id number]  DATE OF BIRTH:  [birth year]  DATE OF ADMISSION:  03/20/2017  PRIMARY CARE PHYSICIAN: Patient, No Pcp Per   REQUESTING/REFERRING PHYSICIAN:   CHIEF COMPLAINT:   Chief Complaint  Patient presents with  . Abdominal Pain    HISTORY OF PRESENT ILLNESS: Brandon Allen  is a 36 y.o. male with a known history of atrial fibrillation cyclical vomiting syndrome, hypertension, nephrolithiasis presented to the emergency room with abdominal discomfort nausea and vomiting since last 2 days. Abdominal pain is aching in nature 5 out of 10 on a scale of 1-10. Last bowel movement was yesterday.Vomitus contained food and water. Patient feels dry and dehydrated. He was evaluated in the emergency room and because of intractable vomiting hospitalist service was consulted. His blood pressure has been elevated. No headache dizziness and blurry vision. No numbness in any part of the body. Patient has some cold sweats.patient was worked up with CT abdomen which showed no obstruction.  PAST MEDICAL HISTORY:   Past Medical History:  Diagnosis Date  . Atrial fibrillation (HCC)    Dr Mariah Milling  . 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 Deafness in right ear   . HTN (hypertension) 12/20/2014  . Kidney stones   . NSAID induced gastritis     PAST SURGICAL HISTORY: Past Surgical History:  Procedure Laterality Date  . CHOLECYSTECTOMY    . ESOPHAGOGASTRODUODENOSCOPY    . ESOPHAGOGASTRODUODENOSCOPY  11/2011   NSAID-induced gastritis (Dr Bluford Kaufmann)  . ESOPHAGOGASTRODUODENOSCOPY N/A 06/24/2016   Procedure: ESOPHAGOGASTRODUODENOSCOPY (EGD);  Surgeon: Scot Jun, MD;  Location: [facility] ENDOSCOPY;  Service: Endoscopy;  Laterality: N/A;  . INNER EAR SURGERY      SOCIAL HISTORY:  Social History  Substance Use Topics  . Smoking status: Current Every Day Smoker    Packs/day: 0.50    Types: Cigarettes     Start date: 12/19/2009  . Smokeless tobacco: Never Used  . Alcohol use No    FAMILY HISTORY:  Family History  Problem Relation Age of Onset  . Heart failure Other   . Colon cancer Maternal Grandfather 50    DRUG ALLERGIES:  Allergies  Allergen Reactions  . Morphine And Related Nausea And Vomiting    REVIEW OF SYSTEMS:   CONSTITUTIONAL: No fever, has weakness.  EYES: No blurred or double vision.  EARS, NOSE, AND THROAT: No tinnitus or ear pain.  RESPIRATORY: No cough, shortness of breath, wheezing or hemoptysis.  CARDIOVASCULAR: No chest pain, orthopnea, edema.  GASTROINTESTINAL: Has nausea, vomiting,  abdominal pain.  GENITOURINARY: No dysuria, hematuria.  ENDOCRINE: No polyuria, nocturia,  HEMATOLOGY: No anemia, easy bruising or bleeding SKIN: No rash or lesion. MUSCULOSKELETAL: No joint pain or arthritis.   NEUROLOGIC: No tingling, numbness, weakness.  PSYCHIATRY: No anxiety or depression.   MEDICATIONS AT HOME:  Prior to Admission medications   Medication Sig Start Date End Date Taking? Authorizing Provider  amLODipine (NORVASC) 10 MG tablet Take 1 tablet (10 mg total) by mouth daily. Patient not taking: Reported on 03/21/2017 11/26/16   Enedina Finner, MD  cloNIDine (CATAPRES) 0.2 MG tablet Take 1 tablet (0.2 mg total) by mouth 2 (two) times daily. Patient not taking: Reported on 03/21/2017 11/26/16   Enedina Finner, MD  hydrochlorothiazide (HYDRODIURIL) 25 MG tablet Take 1 tablet (25 mg total) by mouth daily. Patient not taking: Reported on 03/21/2017 11/26/16   Allena Katz,  Sona, MD  ondansetron (ZOFRAN) 4 MG tablet Take 1 tablet (4 mg total) by mouth every 8 (eight) hours as needed for nausea. Patient not taking: Reported on 03/21/2017 11/25/16   Enedina Finner, MD  pantoprazole (PROTONIX) 40 MG tablet Take 1 tablet (40 mg total) by mouth 2 (two) times daily. Patient not taking: Reported on 03/21/2017 11/25/16   Enedina Finner, MD  promethazine (PHENERGAN) 25 MG suppository Place 1  suppository (25 mg total) rectally every 6 (six) hours as needed for nausea. Patient not taking: Reported on 03/21/2017 09/28/16 09/28/17  Loleta Rose, MD  sucralfate (CARAFATE) 1 g tablet Take 1 tablet (1 g total) by mouth 4 (four) times daily -  with meals and at bedtime. Patient not taking: Reported on 03/21/2017 11/25/16   Enedina Finner, MD  traMADol (ULTRAM) 50 MG tablet Take 1 tablet (50 mg total) by mouth 3 (three) times daily as needed. Patient not taking: Reported on 03/21/2017 11/26/16   Enedina Finner, MD      PHYSICAL EXAMINATION:   VITAL SIGNS: Blood pressure (!) 185/126, pulse 76, temperature 97.8 F (36.6 C), temperature source Oral, resp. rate 18, SpO2 98 %.  GENERAL:  36 y.o.-year-old patient lying in the bed with no acute distress.  EYES: Pupils equal, round, reactive to light and accommodation. No scleral icterus. Extraocular muscles intact.  HEENT: Head atraumatic, normocephalic. Oropharynx dry and nasopharynx clear.  NECK:  Supple, no jugular venous distention. No thyroid enlargement, no tenderness.  LUNGS: Normal breath sounds bilaterally, no wheezing, rales,rhonchi or crepitation. No use of accessory muscles of respiration.  CARDIOVASCULAR: S1, S2 normal. No murmurs, rubs, or gallops.  ABDOMEN: Soft, tenderness around umbilicus, nondistended. Bowel sounds present. No organomegaly or mass.  EXTREMITIES: No pedal edema, cyanosis, or clubbing.  NEUROLOGIC: Cranial nerves II through XII are intact. Muscle strength 5/5 in all extremities. Sensation intact. Gait not checked.  PSYCHIATRIC: The patient is alert and oriented x 3.  SKIN: No obvious rash, lesion, or ulcer.   LABORATORY PANEL:   CBC  Recent Labs Lab 03/20/17 1921  WBC 21.3*  HGB 17.8  HCT 51.8  PLT 220  MCV 90.6  MCH 31.2  MCHC 34.4  RDW 12.1  LYMPHSABS 3.4  MONOABS 1.5*  EOSABS 0.2  BASOSABS 0.1    ------------------------------------------------------------------------------------------------------------------  Chemistries   Recent Labs Lab 03/20/17 1921  NA 141  K 3.3*  CL 102  CO2 25  GLUCOSE 186*  BUN 15  CREATININE 1.08  CALCIUM 11.2*  AST 31  ALT 24  ALKPHOS 61  BILITOT 1.4*   ------------------------------------------------------------------------------------------------------------------ CrCl cannot be calculated (Unknown ideal weight.). ------------------------------------------------------------------------------------------------------------------ No results for input(s): TSH, T4TOTAL, T3FREE, THYROIDAB in the last 72 hours.  Invalid input(s): FREET3   Coagulation profile No results for input(s): INR, PROTIME in the last 168 hours. ------------------------------------------------------------------------------------------------------------------- No results for input(s): DDIMER in the last 72 hours. -------------------------------------------------------------------------------------------------------------------  Cardiac Enzymes  Recent Labs Lab 03/20/17 1921  TROPONINI <0.03   ------------------------------------------------------------------------------------------------------------------ Invalid input(s): POCBNP  ---------------------------------------------------------------------------------------------------------------  Urinalysis    Component Value Date/Time   COLORURINE YELLOW (A) 03/20/2017 2303   APPEARANCEUR CLEAR (A) 03/20/2017 2303   APPEARANCEUR Clear 07/06/2014 1516   LABSPEC >1.046 (H) 03/20/2017 2303   LABSPEC 1.031 07/06/2014 1516   PHURINE 6.0 03/20/2017 2303   GLUCOSEU NEGATIVE 03/20/2017 2303   GLUCOSEU Negative 07/06/2014 1516   HGBUR NEGATIVE 03/20/2017 2303   BILIRUBINUR NEGATIVE 03/20/2017 2303   BILIRUBINUR Negative 07/06/2014 1516   KETONESUR 80 (A) 03/20/2017 2303  PROTEINUR 100 (A) 03/20/2017 2303    UROBILINOGEN 0.2 07/04/2014 2213   NITRITE NEGATIVE 03/20/2017 2303   LEUKOCYTESUR TRACE (A) 03/20/2017 2303   LEUKOCYTESUR Negative 07/06/2014 1516     RADIOLOGY: Ct Abdomen Pelvis W Contrast  Result Date: 03/20/2017 CLINICAL DATA:  Acute onset of midsternal chest pain and generalized abdominal pain. Diaphoresis and leukocytosis. Initial encounter. EXAM: CT ABDOMEN AND PELVIS WITH CONTRAST TECHNIQUE: Multidetector CT imaging of the abdomen and pelvis was performed using the standard protocol following bolus administration of intravenous contrast. CONTRAST:  ISOVUE-300 IOPAMIDOL (ISOVUE-300) INJECTION 61% COMPARISON:  CT of the abdomen and pelvis performed 11/24/2016 FINDINGS: Lower chest: The visualized lung bases are grossly clear. The visualized portions of the mediastinum are unremarkable. Hepatobiliary: The liver is unremarkable in appearance. The patient is status post cholecystectomy. The common bile duct remains normal in caliber. Pancreas: The pancreas is within normal limits. Spleen: The spleen is unremarkable in appearance. Adrenals/Urinary Tract: The adrenal glands are unremarkable in appearance. The kidneys are within normal limits. There is no evidence of hydronephrosis. No renal or ureteral stones are identified. No perinephric stranding is seen. Stomach/Bowel: The stomach is unremarkable in appearance. The small bowel is within normal limits. The appendix is normal in caliber, without evidence of appendicitis. The colon is unremarkable in appearance. Vascular/Lymphatic: The abdominal aorta is unremarkable in appearance. The inferior vena cava is grossly unremarkable. No retroperitoneal lymphadenopathy is seen. No pelvic sidewall lymphadenopathy is identified. Reproductive: The bladder is mildly distended and grossly unremarkable. The prostate remains normal in size. Other: No additional soft tissue abnormalities are seen. Musculoskeletal: No acute osseous abnormalities are identified.  The visualized musculature is unremarkable in appearance. IMPRESSION: Unremarkable contrast-enhanced CT of the abdomen and pelvis Electronically Signed   By: Roanna Raider M.D.   On: 03/20/2017 22:53    EKG: Orders placed or performed during the hospital encounter of 03/20/17  . EKG 12-Lead  . EKG 12-Lead  . ED EKG  . ED EKG    IMPRESSION AND PLAN: 36 year old male patient with cyclical vomiting syndrome, hypertension, nephrolithiasis, atrial fibrillation presented to the emergency room with abdominal pain, nausea and vomiting and elevated blood pressure.  Admitting diagnosis 1.Intractable nausea vomiting 2.Uncontrolled hypertension 3. Cyclical vomiting syndrome 4. Hypokalemia 5.Leukocytosis probably secondary to vomiting  Treatment plan Admit patient to telemetry IV fluid hydration Potassium supplementation Control blood pressure with IV hydralazine as needed Antiemetics Follow-up electrolytes Pain management with IV Toradol Supportive care  All the records are reviewed and case discussed with ED provider. Management plans discussed with the patient, family and they are in agreement.  CODE STATUS:FULL CODE Code Status History    Date Active Date Inactive Code Status Order ID Comments User Context   11/25/2016  1:27 AM 11/26/2016  3:54 PM Full Code [id number]  Oralia Manis, MD Inpatient   06/23/2016 10:48 AM 06/25/2016  8:27 PM Full Code [id number]  Arnaldo Natal, MD Inpatient   01/25/2016  8:57 PM 01/26/2016  4:20 PM Full Code [id number]  Arnaldo Natal, MD Inpatient   12/20/2014  3:27 AM 12/21/2014  6:47 PM Full Code [id number]  Hower, Cletis [place], MD ED       TOTAL TIME TAKING CARE OF THIS PATIENT: 51 minutes.    Ihor Austin M.D on 03/21/2017 at 2:47 AM  Between 7am to 6pm - Pager - [phone]  After 6pm go to [email or web address] - password [email or web address] [facility]  [place] [facility]  Office  [phone]  CC: Primary care physician;  Patient, No Pcp Per

## 2017-03-22 MED ORDER — AMLODIPINE BESYLATE 10 MG PO TABS: 10.0000 mg | ORAL_TABLET | Freq: Every day | ORAL | 0 refills | Status: DC

## 2017-03-22 MED ORDER — PANTOPRAZOLE SODIUM 40 MG PO TBEC: 40.0000 mg | DELAYED_RELEASE_TABLET | Freq: Two times a day (BID) | ORAL | 0 refills | Status: DC

## 2017-03-22 MED ORDER — CLONIDINE HCL 0.2 MG PO TABS: 0.2000 mg | ORAL_TABLET | Freq: Two times a day (BID) | ORAL | 0 refills | Status: DC

## 2017-03-22 MED ORDER — TRAMADOL HCL 50 MG PO TABS: 50.0000 mg | ORAL_TABLET | Freq: Three times a day (TID) | ORAL | 0 refills | Status: DC | PRN

## 2017-03-22 MED ORDER — HYDROCHLOROTHIAZIDE 25 MG PO TABS: 25.0000 mg | ORAL_TABLET | Freq: Every day | ORAL | 0 refills | Status: DC

## 2017-03-22 NOTE — Progress Notes (Signed)
[facility] - [place] at [facility]    Brandon Allen was admitted to the Hospital on 03/20/2017 and Discharged  03/22/2017 and should be excused from work/school   for 2  days starting 03/20/2017 , may return to work/school without any restrictions.  Call Auburn Bilberry MD with questions.  Auburn Bilberry M.D on 03/22/2017,at 11:00 AM  [facility] - [place] at [facility]  [phone]

## 2017-03-22 NOTE — Progress Notes (Signed)
Pt instructed on discharge instructions and medications and verbalized understanding, prescriptions given to pt, IV catheter discontinued, telemetry removed, pt left in stable condition via wheelchair in stable condition with no s/s of distress or discomfort. Suzzette Righter , RN

## 2017-03-22 NOTE — Discharge Summary (Signed)
[facility] [name] [facility], [place] y.o., DOB 1981-03-05, MRN [id number]. Admission date: 03/20/2017 Discharge Date 03/22/2017 Primary MD Patient, No Pcp Per Admitting Physician Ihor Austin, MD  Admission Diagnosis  Intractable cyclical vomiting with nausea [G43.A1] Hypertensive urgency [I16.0]  Discharge Diagnosis   Active Problems: Intractable  Nausea & vomiting   Hypertensive urgency  Cyclical vomiting syndrome  Leukocytosis appears to be chronic  Hypokalemia       Hospital Course Brandon Allen  is a 36 y.o. male with a known history of atrial fibrillation cyclical vomiting syndrome, hypertension, nephrolithiasis presented to the emergency room with abdominal discomfort nausea and vomiting since last 2 days. Abdominal pain is aching in nature 5 out of 10 on a scale of 1-10. Patient had a CT scan of the abdomen which was unrevealing. His blood pressure was elevated on presentation he was not taking his blood pressure medications. Patient was admitted given IV fluids pain control and antiemetics his symptoms are resolved. His blood pressure medications were restarted and he is feeling much better he does have chronic leukocytosis I will have him follow up with hematology for this.            Consults  None  Significant Tests:  See full reports for all details     Ct Abdomen Pelvis W Contrast  Result Date: 03/20/2017 CLINICAL DATA:  Acute onset of midsternal chest pain and generalized abdominal pain. Diaphoresis and leukocytosis. Initial encounter. EXAM: CT ABDOMEN AND PELVIS WITH CONTRAST TECHNIQUE: Multidetector CT imaging of the abdomen and pelvis was performed using the standard protocol following bolus administration of intravenous contrast. CONTRAST:  ISOVUE-300 IOPAMIDOL (ISOVUE-300) INJECTION 61% COMPARISON:  CT of the abdomen and pelvis performed 11/24/2016 FINDINGS: Lower chest: The visualized lung bases are grossly clear. The  visualized portions of the mediastinum are unremarkable. Hepatobiliary: The liver is unremarkable in appearance. The patient is status post cholecystectomy. The common bile duct remains normal in caliber. Pancreas: The pancreas is within normal limits. Spleen: The spleen is unremarkable in appearance. Adrenals/Urinary Tract: The adrenal glands are unremarkable in appearance. The kidneys are within normal limits. There is no evidence of hydronephrosis. No renal or ureteral stones are identified. No perinephric stranding is seen. Stomach/Bowel: The stomach is unremarkable in appearance. The small bowel is within normal limits. The appendix is normal in caliber, without evidence of appendicitis. The colon is unremarkable in appearance. Vascular/Lymphatic: The abdominal aorta is unremarkable in appearance. The inferior vena cava is grossly unremarkable. No retroperitoneal lymphadenopathy is seen. No pelvic sidewall lymphadenopathy is identified. Reproductive: The bladder is mildly distended and grossly unremarkable. The prostate remains normal in size. Other: No additional soft tissue abnormalities are seen. Musculoskeletal: No acute osseous abnormalities are identified. The visualized musculature is unremarkable in appearance. IMPRESSION: Unremarkable contrast-enhanced CT of the abdomen and pelvis Electronically Signed   By: Roanna Raider M.D.   On: 03/20/2017 22:53       Today   Subjective:   Brandon Allen  feels much better wants to go home   Objective:   Blood pressure 134/79, pulse 77, temperature 98.1 F (36.7 C), temperature source Oral, resp. rate 18, height 6' (1.829 m), weight 196 lb (88.9 kg), SpO2 98 %.  .  Intake/Output Summary (Last 24 hours) at 03/22/17 1539 Last data filed at 03/22/17 1003  Gross per 24 hour  Intake             2625 ml  Output  700 ml  Net             1925 ml    Exam VITAL SIGNS: Blood pressure 134/79, pulse 77, temperature 98.1 F (36.7 C),  temperature source Oral, resp. rate 18, height 6' (1.829 m), weight 196 lb (88.9 kg), SpO2 98 %.  GENERAL:  36 y.o.-year-old patient lying in the bed with no acute distress.  EYES: Pupils equal, round, reactive to light and accommodation. No scleral icterus. Extraocular muscles intact.  HEENT: Head atraumatic, normocephalic. Oropharynx and nasopharynx clear.  NECK:  Supple, no jugular venous distention. No thyroid enlargement, no tenderness.  LUNGS: Normal breath sounds bilaterally, no wheezing, rales,rhonchi or crepitation. No use of accessory muscles of respiration.  CARDIOVASCULAR: S1, S2 normal. No murmurs, rubs, or gallops.  ABDOMEN: Soft, nontender, nondistended. Bowel sounds present. No organomegaly or mass.  EXTREMITIES: No pedal edema, cyanosis, or clubbing.  NEUROLOGIC: Cranial nerves II through XII are intact. Muscle strength 5/5 in all extremities. Sensation intact. Gait not checked.  PSYCHIATRIC: The patient is alert and oriented x 3.  SKIN: No obvious rash, lesion, or ulcer.   Data Review     CBC w Diff: Lab Results  Component Value Date   WBC 21.0 (H) 03/21/2017   HGB 17.0 03/21/2017   HGB 15.7 07/06/2014   HCT 49.1 03/21/2017   HCT 47.6 07/06/2014   PLT 261 03/21/2017   PLT 210 07/06/2014   LYMPHOPCT 16 03/20/2017   LYMPHOPCT 21.4 03/13/2014   BANDSPCT 0 09/28/2016   MONOPCT 7 03/20/2017   MONOPCT 7.0 03/13/2014   EOSPCT 1 03/20/2017   EOSPCT 1.5 03/13/2014   BASOPCT 0 03/20/2017   BASOPCT 0.8 03/13/2014   CMP: Lab Results  Component Value Date   NA 141 03/21/2017   NA 141 07/06/2014   K 3.5 03/21/2017   K 4.0 07/06/2014   CL 110 03/21/2017   CL 107 07/06/2014   CO2 23 03/21/2017   CO2 26 07/06/2014   BUN 17 03/21/2017   BUN 9 07/06/2014   CREATININE 1.00 03/21/2017   CREATININE 1.27 07/06/2014   PROT 9.2 (H) 03/20/2017   PROT 8.3 (H) 07/06/2014   ALBUMIN 5.4 (H) 03/20/2017   ALBUMIN 4.5 07/06/2014   BILITOT 1.4 (H) 03/20/2017   BILITOT 0.9  07/06/2014   ALKPHOS 61 03/20/2017   ALKPHOS 68 07/06/2014   AST 31 03/20/2017   AST 28 07/06/2014   ALT 24 03/20/2017   ALT 23 07/06/2014  .  Micro Results No results found for this or any previous visit (from the past 240 hour(s)).   Code Status History    Date Active Date Inactive Code Status Order ID Comments User Context   03/21/2017  3:58 AM 03/22/2017  3:17 PM Full Code [id number]  Ihor Austin, MD Inpatient   11/25/2016  1:27 AM 11/26/2016  3:54 PM Full Code [id number]  Oralia Manis, MD Inpatient   06/23/2016 10:48 AM 06/25/2016  8:27 PM Full Code [id number]  Arnaldo Natal, MD Inpatient   01/25/2016  8:57 PM 01/26/2016  4:20 PM Full Code [id number]  Arnaldo Natal, MD Inpatient   12/20/2014  3:27 AM 12/21/2014  6:47 PM Full Code [id number]  Hower, Cletis Athens, MD ED          Follow-up Information    Scot Jun, MD. Go on 05/03/2017.   Specialty:  Gastroenterology Why:  Appointment Time: 2:15pm. This was the first available appointment they had. They are putting you on the call/wait list,  if needed to be seen sooner, they will fit you in or if a closer appointment becomes available, which ever happens first. Thanks! Contact information: [address] [place] [address] [phone]        Rickard Patience, MD. Go on 04/07/2017.   Specialty:  Oncology Why:  elevated wbc no infection, Appointment Time: 1:30pm Contact information: [address] [place] [address] [phone]           Discharge Medications   Allergies as of 03/22/2017      Reactions   Morphine And Related Nausea And Vomiting      Medication List    STOP taking these medications   ondansetron 4 MG tablet Commonly known as:  ZOFRAN   promethazine 25 MG suppository Commonly known as:  PHENERGAN   sucralfate 1 g tablet Commonly known as:  CARAFATE     TAKE these medications   amLODipine 10 MG tablet Commonly known as:  NORVASC Take 1 tablet (10 mg total) by mouth  daily.   cloNIDine 0.2 MG tablet Commonly known as:  CATAPRES Take 1 tablet (0.2 mg total) by mouth 2 (two) times daily.   hydrochlorothiazide 25 MG tablet Commonly known as:  HYDRODIURIL Take 1 tablet (25 mg total) by mouth daily.   pantoprazole 40 MG tablet Commonly known as:  PROTONIX Take 1 tablet (40 mg total) by mouth 2 (two) times daily.   traMADol 50 MG tablet Commonly known as:  ULTRAM Take 1 tablet (50 mg total) by mouth 3 (three) times daily as needed.          Total Time in preparing paper work, data evaluation and todays exam - 35 minutes  Auburn Bilberry M.D on 03/22/2017 at 3:39 PM  [facility]   Office  [phone]

## 2017-03-22 NOTE — Progress Notes (Signed)
Same day note  Patient admitted for cyclical vomiting. Also hypertensive urgency.  Seen and examined. Labs and vitals reviewed  * Continues to have nausea and abdominal pain along with elevated BP. Restarted home meds for BP. Likely d/c in AM once improved.

## 2017-03-23 LAB — HIV ANTIBODY (ROUTINE TESTING W REFLEX): HIV SCREEN 4TH GENERATION: NONREACTIVE

## 2017-04-06 DIAGNOSIS — Z720 Tobacco use: Secondary | ICD-10-CM | POA: Insufficient documentation

## 2017-04-07 ENCOUNTER — Inpatient Hospital Stay: Payer: Self-pay | Admitting: Oncology

## 2017-04-19 ENCOUNTER — Inpatient Hospital Stay: Payer: Self-pay | Admitting: Oncology

## 2017-04-27 ENCOUNTER — Emergency Department
Admission: EM | Admit: 2017-04-27 | Discharge: 2017-04-27 | Disposition: A | Discharge status: Home / Self Care | Payer: Self-pay | Attending: Emergency Medicine | Admitting: Emergency Medicine

## 2017-04-27 DIAGNOSIS — R1115 Cyclical vomiting syndrome unrelated to migraine: Secondary | ICD-10-CM

## 2017-04-27 DIAGNOSIS — Z79899 Other long term (current) drug therapy: Secondary | ICD-10-CM | POA: Insufficient documentation

## 2017-04-27 DIAGNOSIS — F17210 Nicotine dependence, cigarettes, uncomplicated: Secondary | ICD-10-CM | POA: Insufficient documentation

## 2017-04-27 DIAGNOSIS — I10 Essential (primary) hypertension: Secondary | ICD-10-CM | POA: Insufficient documentation

## 2017-04-27 DIAGNOSIS — G43A0 Cyclical vomiting, not intractable: Secondary | ICD-10-CM | POA: Insufficient documentation

## 2017-04-27 LAB — CBC WITH DIFFERENTIAL/PLATELET
BASOS PCT: 1 %
Basophils Absolute: 0.1 10*3/uL (ref 0–0.1)
EOS PCT: 0 %
Eosinophils Absolute: 0 10*3/uL (ref 0–0.7)
HCT: 47.7 % (ref 40.0–52.0)
Hemoglobin: 16.5 g/dL (ref 13.0–18.0)
LYMPHS PCT: 17 %
Lymphs Abs: 2.6 10*3/uL (ref 1.0–3.6)
MCH: 31.2 pg (ref 26.0–34.0)
MCHC: 34.7 g/dL (ref 32.0–36.0)
MCV: 90 fL (ref 80.0–100.0)
MONO ABS: 1.1 10*3/uL — AB (ref 0.2–1.0)
Monocytes Relative: 7 %
Neutro Abs: 11.7 10*3/uL — ABNORMAL HIGH (ref 1.4–6.5)
Neutrophils Relative %: 75 %
PLATELETS: 287 10*3/uL (ref 150–440)
RBC: 5.3 MIL/uL (ref 4.40–5.90)
RDW: 12.3 % (ref 11.5–14.5)
WBC: 15.6 10*3/uL — ABNORMAL HIGH (ref 3.8–10.6)

## 2017-04-27 LAB — COMPREHENSIVE METABOLIC PANEL
ALT: 27 U/L (ref 17–63)
ANION GAP: 14 (ref 5–15)
AST: 38 U/L (ref 15–41)
Albumin: 4.7 g/dL (ref 3.5–5.0)
Alkaline Phosphatase: 57 U/L (ref 38–126)
BUN: 13 mg/dL (ref 6–20)
CHLORIDE: 105 mmol/L (ref 101–111)
CO2: 21 mmol/L — ABNORMAL LOW (ref 22–32)
Calcium: 10 mg/dL (ref 8.9–10.3)
Creatinine, Ser: 1.32 mg/dL — ABNORMAL HIGH (ref 0.61–1.24)
GFR calc Af Amer: >60 mL/min (ref >60)
Glucose, Bld: 154 mg/dL — ABNORMAL HIGH (ref 65–99)
POTASSIUM: 3 mmol/L — AB (ref 3.5–5.1)
Sodium: 140 mmol/L (ref 135–145)
TOTAL PROTEIN: 8.1 g/dL (ref 6.5–8.1)
Total Bilirubin: 0.9 mg/dL (ref 0.3–1.2)

## 2017-04-27 LAB — LIPASE, BLOOD: LIPASE: 27 U/L (ref 11–51)

## 2017-04-27 MED ORDER — PROMETHAZINE HCL 25 MG/ML IJ SOLN
12.5000 mg | Freq: Once | INTRAMUSCULAR | Status: AC
  Administered 2017-04-27: 12.5 mg via INTRAVENOUS

## 2017-04-27 MED ORDER — POTASSIUM CHLORIDE 20 MEQ PO PACK
40.0000 meq | PACK | Freq: Once | ORAL | Status: AC
  Administered 2017-04-27: 40 meq via ORAL

## 2017-04-27 MED ORDER — PROMETHAZINE HCL 12.5 MG PO TABS: 12.5000 mg | ORAL_TABLET | Freq: Four times a day (QID) | ORAL | 0 refills | Status: DC | PRN

## 2017-04-27 MED ORDER — SODIUM CHLORIDE 0.9 % IV BOLUS (SEPSIS)
1000.0000 mL | Freq: Once | INTRAVENOUS | Status: AC
  Administered 2017-04-27: 1000 mL via INTRAVENOUS

## 2017-04-27 MED ORDER — POTASSIUM CHLORIDE 20 MEQ PO PACK
PACK | ORAL | Status: AC
  Filled 2017-04-27: qty 2

## 2017-04-27 MED ORDER — CLONIDINE HCL 0.1 MG PO TABS
0.1000 mg | ORAL_TABLET | Freq: Once | ORAL | Status: AC
Start: 2017-04-27 — End: 2017-04-27
  Administered 2017-04-27: 0.1 mg via ORAL
  Filled 2017-04-27: qty 1

## 2017-04-27 MED ORDER — ONDANSETRON HCL 4 MG/2ML IJ SOLN
INTRAMUSCULAR | Status: AC
  Filled 2017-04-27: qty 2

## 2017-04-27 MED ORDER — BUPRENORPHINE HCL-NALOXONE HCL 8-2 MG SL SUBL
1.0000 | SUBLINGUAL_TABLET | Freq: Every day | SUBLINGUAL | Status: DC
  Administered 2017-04-27: 1 via SUBLINGUAL
  Filled 2017-04-27: qty 1

## 2017-04-27 MED ORDER — CLONIDINE HCL 0.2 MG PO TABS: 0.2000 mg | ORAL_TABLET | Freq: Two times a day (BID) | ORAL | 0 refills | Status: DC

## 2017-04-27 MED ORDER — ONDANSETRON HCL 4 MG/2ML IJ SOLN
4.0000 mg | Freq: Once | INTRAMUSCULAR | Status: AC
  Administered 2017-04-27: 4 mg via INTRAVENOUS

## 2017-04-27 MED ORDER — PROMETHAZINE HCL 25 MG/ML IJ SOLN
INTRAMUSCULAR | Status: AC
  Filled 2017-04-27: qty 1

## 2017-04-27 NOTE — ED Triage Notes (Signed)
Pt states x1 hour PTA, persistent vomiting. zofran PTA without relief.

## 2017-04-27 NOTE — ED Provider Notes (Signed)
[facility] Emergency Department Provider Note    First MD Initiated Contact with Patient 04/27/17 [phone]     (approximate)  I have reviewed the triage vital signs and the nursing notes.   HISTORY  Chief Complaint Emesis    HPI Brandon Allen is a 36 y.o. male with bolus of chronic medical conditionsincluding cyclical nausea and vomiting syndrome secondary to cannabinoid use presents to the emergency department with acute onset of generalized abdominal pain yesterday accompanied by multiple episodes of vomiting. Patient states his current pain score is 10 out of 10. Patient denies any diarrhea or constipation. Patient denies any fever or febrile on presentation.   Past Medical History:  Diagnosis Date  . Atrial fibrillation (HCC)    Dr Mariah Milling  . 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 Deafness in right ear   . HTN (hypertension) 12/20/2014  . Kidney stones   . NSAID induced gastritis     Patient Active Problem List   Diagnosis Date Noted  . Tobacco abuse 04/06/2017  . Nausea & vomiting 03/21/2017  . Hypertensive urgency 03/21/2017  . GERD (gastroesophageal reflux disease) 11/24/2016  . Leukocytosis 01/26/2016  . Hyperglycemia 01/26/2016  . Essential hypertension 01/26/2016  . Cannabis abuse 01/26/2016  . Cyclic vomiting syndrome 01/25/2016  . Atrial fibrillation with rapid ventricular response (HCC) 12/20/2014  . HTN (hypertension) 12/20/2014    Past Surgical History:  Procedure Laterality Date  . CHOLECYSTECTOMY    . ESOPHAGOGASTRODUODENOSCOPY    . ESOPHAGOGASTRODUODENOSCOPY  11/2011   NSAID-induced gastritis (Dr Bluford Kaufmann)  . ESOPHAGOGASTRODUODENOSCOPY N/A 06/24/2016   Procedure: ESOPHAGOGASTRODUODENOSCOPY (EGD);  Surgeon: Scot Jun, MD;  Location: [facility] ENDOSCOPY;  Service: Endoscopy;  Laterality: N/A;  . INNER EAR SURGERY      Prior to Admission medications   Medication Sig Start Date End Date Taking? Authorizing Provider    amLODipine (NORVASC) 10 MG tablet Take 1 tablet (10 mg total) by mouth daily. 03/22/17   Auburn Bilberry, MD  cloNIDine (CATAPRES) 0.2 MG tablet Take 1 tablet (0.2 mg total) by mouth 2 (two) times daily. 03/22/17   Auburn Bilberry, MD  hydrochlorothiazide (HYDRODIURIL) 25 MG tablet Take 1 tablet (25 mg total) by mouth daily. 03/22/17   Auburn Bilberry, MD  pantoprazole (PROTONIX) 40 MG tablet Take 1 tablet (40 mg total) by mouth 2 (two) times daily. 03/22/17   Auburn Bilberry, MD  traMADol (ULTRAM) 50 MG tablet Take 1 tablet (50 mg total) by mouth 3 (three) times daily as needed. 03/22/17   Auburn Bilberry, MD    Allergies Morphine and related  Family History  Problem Relation Age of Onset  . Heart failure Other   . Colon cancer Maternal Grandfather 89    Social History Social History  Substance Use Topics  . Smoking status: Current Every Day Smoker    Packs/day: 0.50    Types: Cigarettes    Start date: 12/19/2009  . Smokeless tobacco: Never Used  . Alcohol use No    Review of Systems Constitutional: No fever/chills Eyes: No visual changes. ENT: No sore throat. Cardiovascular: Denies chest pain. Respiratory: Denies shortness of breath. Gastrointestinal: No abdominal pain.  No nausea, no vomiting.  No diarrhea.  No constipation. Genitourinary: Negative for dysuria. Musculoskeletal: Negative for neck pain.  Negative for back pain. Integumentary: Negative for rash. Neurological: Negative for headaches, focal weakness or numbness.   ____________________________________________   PHYSICAL EXAM:  VITAL SIGNS: ED Triage Vitals [04/27/17 0213]  Enc  Vitals Group     BP (!) 169/120     Pulse Rate (!) 114     Resp 20     Temp 97.8 F (36.6 C)     Temp Source Oral     SpO2 100 %     Weight 90.7 kg (200 lb)     Height 1.829 m (6')     Head Circumference      Peak Flow      Pain Score 8     Pain Loc      Pain Edu?      Excl. in GC?     Constitutional: Alert and  oriented. Well appearing and in no acute distress. Eyes: Conjunctivae are normal.  Head: Atraumatic. Mouth/Throat: Mucous membranes are moist. Oropharynx non-erythematous. Neck: No stridor.   Cardiovascular: Normal rate, regular rhythm. Good peripheral circulation. Grossly normal heart sounds. Respiratory: Normal respiratory effort.  No retractions. Lungs CTAB. Gastrointestinal: generalized tenderness with very gentle palpation. No tenderness with palpation while distracted No distention.  Musculoskeletal: No lower extremity tenderness nor edema. No gross deformities of extremities. Neurologic:  Normal speech and language. No gross focal neurologic deficits are appreciated.  Skin:  Skin is warm, dry and intact. No rash noted. Psychiatric: Mood and affect are normal. Speech and behavior are normal.**}  ____________________________________________   LABS (all labs ordered are listed, but only abnormal results are displayed)  Labs Reviewed  CBC WITH DIFFERENTIAL/PLATELET - Abnormal; Notable for the following:       Result Value   WBC 15.6 (*)    Neutro Abs 11.7 (*)    Monocytes Absolute 1.1 (*)    All other components within normal limits  COMPREHENSIVE METABOLIC PANEL - Abnormal; Notable for the following:    Potassium 3.0 (*)    CO2 21 (*)    Glucose, Bld 154 (*)    Creatinine, Ser 1.32 (*)    All other components within normal limits  LIPASE, BLOOD  URINE DRUG SCREEN, QUALITATIVE ([facility] ONLY)   ____________________________________________  EKG  ED ECG REPORT I, [place] N BROWN, the attending physician, personally viewed and interpreted this ECG.   Date: 04/27/2017  EKG Time: 2:21 AM  Rate: 124  Rhythm: sinus tachycardia  Axis: normal  Intervals:normal  ST&T Change: none   Procedures   ____________________________________________   INITIAL IMPRESSION / ASSESSMENT AND PLAN / ED COURSE  Pertinent labs & imaging results that were available during my care of the  patient were reviewed by me and considered in my medical decision making (see chart for details).   36 year old male with above stated history of physical exam. Patient states last marijuana use yesterday last oxycodone intake yesterday. Concern for possible intra-abdominal pathology and a such reviewed CT scan which was performed last month which was negative. Laboratory data today consistent with previous laboratory data. Patient unable to be hypokalemic and a such will be given potassium chloride 40 mEq. Patient given IV Phenergan and Zofran with resolution of vomiting at this time considered the possibility of opiate withdrawal such patient given a dose of Suboxone symptoms resolved. Patient's vomiting most likely secondary to possible cyclic nausea and vomiting secondary to marijuana use versus opiate withdrawal     ____________________________________________  FINAL CLINICAL IMPRESSION(S) / ED DIAGNOSES  Final diagnoses:  Non-intractable cyclical vomiting with nausea     MEDICATIONS GIVEN DURING THIS VISIT:  Medications  buprenorphine-naloxone (SUBOXONE) 8-2 mg per SL tablet 1 tablet (1 tablet Sublingual Given 04/27/17 0326)  ondansetron (ZOFRAN)  injection 4 mg (4 mg Intravenous Given 04/27/17 0218)  promethazine (PHENERGAN) injection 12.5 mg (12.5 mg Intravenous Given 04/27/17 0232)  sodium chloride 0.9 % bolus 1,000 mL (0 mLs Intravenous Stopped 04/27/17 0329)  promethazine (PHENERGAN) injection 12.5 mg (12.5 mg Intravenous Given 04/27/17 0411)  cloNIDine (CATAPRES) tablet 0.1 mg (0.1 mg Oral Given 04/27/17 0542)     NEW OUTPATIENT MEDICATIONS STARTED DURING THIS VISIT:  New Prescriptions   No medications on file    Modified Medications   No medications on file    Discontinued Medications   No medications on file     Note:  This document was prepared using Dragon voice recognition software and may include unintentional dictation errors.    Darci Current,  MD 04/27/17 [phone]

## 2017-04-27 NOTE — ED Notes (Signed)
Pt friend coming to pick up patient. Pt alert and oriented X4, active, cooperative, pt in NAD. RR even and unlabored, color WNL.  Pt informed to return if any life threatening symptoms occur.  Discharge and followup instructions reviewed.

## 2017-04-27 NOTE — ED Notes (Signed)
Pt resting upon entering room, states "feeling better"

## 2017-04-27 NOTE — ED Notes (Signed)
Pt able to tolerate PO fluids. Awaiting ride.

## 2017-04-27 NOTE — ED Notes (Signed)
ED Provider at bedside. 

## 2017-04-27 NOTE — ED Notes (Signed)
Escorted patient to lobby via wheelchair. Friend on the way.

## 2017-04-27 NOTE — ED Notes (Signed)
1 unsuccessful PIV attempt by this RN.

## 2017-06-07 ENCOUNTER — Emergency Department: Payer: Self-pay

## 2017-06-07 ENCOUNTER — Observation Stay
Admission: EM | Admit: 2017-06-07 | Discharge: 2017-06-09 | Disposition: A | Discharge status: Home / Self Care | Payer: Self-pay | Attending: Internal Medicine | Admitting: Internal Medicine

## 2017-06-07 ENCOUNTER — Other Ambulatory Visit: Payer: Self-pay

## 2017-06-07 ENCOUNTER — Observation Stay: Admit: 2017-06-07 | Payer: Self-pay

## 2017-06-07 DIAGNOSIS — F17210 Nicotine dependence, cigarettes, uncomplicated: Secondary | ICD-10-CM | POA: Insufficient documentation

## 2017-06-07 DIAGNOSIS — K219 Gastro-esophageal reflux disease without esophagitis: Secondary | ICD-10-CM | POA: Insufficient documentation

## 2017-06-07 DIAGNOSIS — G43A1 Cyclical vomiting, intractable: Secondary | ICD-10-CM

## 2017-06-07 DIAGNOSIS — Z87442 Personal history of urinary calculi: Secondary | ICD-10-CM | POA: Insufficient documentation

## 2017-06-07 DIAGNOSIS — R112 Nausea with vomiting, unspecified: Secondary | ICD-10-CM | POA: Diagnosis present

## 2017-06-07 DIAGNOSIS — I10 Essential (primary) hypertension: Secondary | ICD-10-CM | POA: Insufficient documentation

## 2017-06-07 DIAGNOSIS — R55 Syncope and collapse: Secondary | ICD-10-CM | POA: Insufficient documentation

## 2017-06-07 DIAGNOSIS — Z79899 Other long term (current) drug therapy: Secondary | ICD-10-CM | POA: Insufficient documentation

## 2017-06-07 DIAGNOSIS — F1210 Cannabis abuse, uncomplicated: Secondary | ICD-10-CM | POA: Insufficient documentation

## 2017-06-07 DIAGNOSIS — F1110 Opioid abuse, uncomplicated: Secondary | ICD-10-CM

## 2017-06-07 DIAGNOSIS — I482 Chronic atrial fibrillation: Secondary | ICD-10-CM | POA: Insufficient documentation

## 2017-06-07 DIAGNOSIS — G43A0 Cyclical vomiting, not intractable: Principal | ICD-10-CM | POA: Insufficient documentation

## 2017-06-07 LAB — HEPATIC FUNCTION PANEL
ALBUMIN: 4.6 g/dL (ref 3.5–5.0)
ALK PHOS: 59 U/L (ref 38–126)
ALT: 22 U/L (ref 17–63)
AST: 25 U/L (ref 15–41)
BILIRUBIN DIRECT: 0.1 mg/dL (ref 0.1–0.5)
Indirect Bilirubin: 0.6 mg/dL (ref 0.3–0.9)
Total Bilirubin: 0.7 mg/dL (ref 0.3–1.2)
Total Protein: 8 g/dL (ref 6.5–8.1)

## 2017-06-07 LAB — BASIC METABOLIC PANEL
Anion gap: 8 (ref 5–15)
BUN: 14 mg/dL (ref 6–20)
CHLORIDE: 105 mmol/L (ref 101–111)
CO2: 25 mmol/L (ref 22–32)
CREATININE: 1.04 mg/dL (ref 0.61–1.24)
Calcium: 9.3 mg/dL (ref 8.9–10.3)
GFR calc Af Amer: >60 mL/min (ref >60)
GFR calc non Af Amer: >60 mL/min (ref >60)
GLUCOSE: 100 mg/dL — AB (ref 65–99)
POTASSIUM: 3.6 mmol/L (ref 3.5–5.1)
Sodium: 138 mmol/L (ref 135–145)

## 2017-06-07 LAB — CBC
HEMATOCRIT: 44.2 % (ref 40.0–52.0)
Hemoglobin: 14.9 g/dL (ref 13.0–18.0)
MCH: 31.3 pg (ref 26.0–34.0)
MCHC: 33.8 g/dL (ref 32.0–36.0)
MCV: 92.6 fL (ref 80.0–100.0)
PLATELETS: 239 10*3/uL (ref 150–440)
RBC: 4.77 MIL/uL (ref 4.40–5.90)
RDW: 12.5 % (ref 11.5–14.5)
WBC: 9.5 10*3/uL (ref 3.8–10.6)

## 2017-06-07 LAB — TROPONIN I
TROPONIN I: 0.08 ng/mL — AB (ref <0.03)
Troponin I: <0.03 ng/mL (ref <0.03)

## 2017-06-07 LAB — GLUCOSE, CAPILLARY: Glucose-Capillary: 98 mg/dL (ref 65–99)

## 2017-06-07 LAB — LIPASE, BLOOD: LIPASE: 28 U/L (ref 11–51)

## 2017-06-07 MED ORDER — IOPAMIDOL (ISOVUE-300) INJECTION 61%
100.0000 mL | Freq: Once | INTRAVENOUS | Status: AC | PRN
  Administered 2017-06-07: 100 mL via INTRAVENOUS

## 2017-06-07 MED ORDER — HYDRALAZINE HCL 20 MG/ML IJ SOLN
10.0000 mg | Freq: Once | INTRAMUSCULAR | Status: AC
  Administered 2017-06-07: 10 mg via INTRAVENOUS
  Filled 2017-06-07: qty 1

## 2017-06-07 MED ORDER — HYDROMORPHONE HCL 1 MG/ML IJ SOLN
0.5000 mg | INTRAMUSCULAR | Status: DC | PRN
  Administered 2017-06-07: 0.5 mg via INTRAVENOUS
  Filled 2017-06-07 (×2): qty 1

## 2017-06-07 MED ORDER — HYDROMORPHONE HCL 1 MG/ML IJ SOLN
1.0000 mg | INTRAMUSCULAR | Status: DC | PRN
  Administered 2017-06-07 – 2017-06-09 (×11): 1 mg via INTRAVENOUS
  Filled 2017-06-07 (×11): qty 1

## 2017-06-07 MED ORDER — SODIUM CHLORIDE 0.9 % IV BOLUS (SEPSIS)
1000.0000 mL | Freq: Once | INTRAVENOUS | Status: AC
  Administered 2017-06-07: 1000 mL via INTRAVENOUS

## 2017-06-07 MED ORDER — PROMETHAZINE HCL 25 MG/ML IJ SOLN
12.5000 mg | Freq: Once | INTRAMUSCULAR | Status: AC
  Administered 2017-06-07: 12.5 mg via INTRAVENOUS
  Filled 2017-06-07: qty 1

## 2017-06-07 MED ORDER — PROMETHAZINE HCL 25 MG/ML IJ SOLN
12.5000 mg | Freq: Four times a day (QID) | INTRAMUSCULAR | Status: DC | PRN
  Administered 2017-06-07 – 2017-06-09 (×7): 12.5 mg via INTRAVENOUS
  Filled 2017-06-07 (×7): qty 1

## 2017-06-07 MED ORDER — LORAZEPAM 2 MG/ML IJ SOLN: 0.5000 mg | Freq: Four times a day (QID) | INTRAMUSCULAR | Status: DC | PRN

## 2017-06-07 MED ORDER — PANTOPRAZOLE SODIUM 40 MG IV SOLR
40.0000 mg | Freq: Two times a day (BID) | INTRAVENOUS | Status: DC
  Administered 2017-06-07 – 2017-06-09 (×5): 40 mg via INTRAVENOUS
  Filled 2017-06-07 (×5): qty 40

## 2017-06-07 MED ORDER — HYDROMORPHONE HCL 1 MG/ML IJ SOLN
1.0000 mg | INTRAMUSCULAR | Status: AC
  Administered 2017-06-07: 1 mg via INTRAVENOUS
  Filled 2017-06-07: qty 1

## 2017-06-07 MED ORDER — HYDRALAZINE HCL 20 MG/ML IJ SOLN
10.0000 mg | INTRAMUSCULAR | Status: DC | PRN
  Administered 2017-06-07: 20 mg via INTRAVENOUS
  Administered 2017-06-08: 04:00:00 10 mg via INTRAVENOUS
  Filled 2017-06-07: qty 1

## 2017-06-07 MED ORDER — HYDROMORPHONE HCL 1 MG/ML IJ SOLN
0.5000 mg | INTRAMUSCULAR | Status: AC
  Administered 2017-06-07: 0.5 mg via INTRAVENOUS
  Filled 2017-06-07: qty 1

## 2017-06-07 MED ORDER — IOPAMIDOL (ISOVUE-300) INJECTION 61%: 30.0000 mL | Freq: Once | INTRAVENOUS | Status: DC

## 2017-06-07 MED ORDER — PROMETHAZINE HCL 25 MG/ML IJ SOLN
12.5000 mg | Freq: Once | INTRAMUSCULAR | Status: AC
  Administered 2017-06-07: 12.5 mg via INTRAMUSCULAR
  Filled 2017-06-07: qty 1

## 2017-06-07 MED ORDER — LABETALOL HCL 5 MG/ML IV SOLN
10.0000 mg | INTRAVENOUS | Status: DC | PRN
  Administered 2017-06-07 – 2017-06-09 (×4): 10 mg via INTRAVENOUS
  Filled 2017-06-07 (×4): qty 4

## 2017-06-07 MED ORDER — ENOXAPARIN SODIUM 40 MG/0.4ML ~~LOC~~ SOLN
40.0000 mg | SUBCUTANEOUS | Status: DC
  Administered 2017-06-07 – 2017-06-08 (×2): 40 mg via SUBCUTANEOUS
  Filled 2017-06-07 (×2): qty 0.4

## 2017-06-07 MED ORDER — SODIUM CHLORIDE 0.9 % IV SOLN
INTRAVENOUS | Status: DC
  Administered 2017-06-07 – 2017-06-08 (×4): via INTRAVENOUS

## 2017-06-07 MED ORDER — ONDANSETRON HCL 4 MG/2ML IJ SOLN
INTRAMUSCULAR | Status: AC
  Filled 2017-06-07: qty 2

## 2017-06-07 MED ORDER — CLONIDINE HCL 0.1 MG PO TABS
0.2000 mg | ORAL_TABLET | Freq: Once | ORAL | Status: AC
  Administered 2017-06-07: 0.2 mg via ORAL
  Filled 2017-06-07: qty 2

## 2017-06-07 MED ORDER — HYDRALAZINE HCL 20 MG/ML IJ SOLN
INTRAMUSCULAR | Status: AC
  Administered 2017-06-07: 20 mg via INTRAVENOUS
  Filled 2017-06-07: qty 1

## 2017-06-07 NOTE — ED Notes (Addendum)
Patient reports had a syncopal episode at work.  Reports had migraine prior to going to work.  States while he was at work he felt "funny and hot" went to the bathroom and splashed some water on his face and then went back out.  On the way out states his supervisor ask him if he was ok cause he looked unwell, patient responded yes and then remembers waking up on the floor with co workers fanning him.  Patient reports last ate a sandwich around 5 pm.  Reports feeling some better at this time.  Dr. Fanny BienQuale at bedside.  PERRLA, lungs clear bilaterally.  Patient reports he has been without blood pressure for approximately 1 week.

## 2017-06-07 NOTE — ED Notes (Signed)
Pt assisted out of car to wheelchair; says he was at work and passed out; had a migraine yesterday; out of HTN medications for 10 days; verbal report to [place], Charity fundraiserN; pt being assisted to treatment room 18 by EDT tech Ashlyn

## 2017-06-07 NOTE — H&P (Signed)
[facility] [place] [facility]   PATIENT NAME: Brandon Allen    MR#:  [id number]  DATE OF BIRTH:  May 08, 1981  DATE OF ADMISSION:  06/07/2017  PRIMARY CARE PHYSICIAN: Patient, No Pcp Per   REQUESTING/REFERRING PHYSICIAN: Quale  CHIEF COMPLAINT:    intractable nausea and vomiting and epigastric abdominal pain HISTORY OF PRESENT ILLNESS:  Brandon Allen  is a 36 y.o. male with a known history of cyclic vomiting syndrome, chronic atrial fibrillation sees Dr. Mariah Milling, essential hypertension, NSAID induced gastritis is presenting to the ED with a chief complaint of intractable nausea and vomiting associated with epigastric abdominal pain.  Patient takes Percocet at home.  He admits to smoking marijuana, patient was given several doses of Phenergan and Dilaudid with no improvement and patient is very diaphoretic, reports chest pain while vomiting.  Denies any diarrhea  PAST MEDICAL HISTORY:   Past Medical History:  Diagnosis Date  . Atrial fibrillation (HCC)    Dr Mariah Milling  . 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 Deafness in right ear   . HTN (hypertension) 12/20/2014  . Kidney stones   . NSAID induced gastritis     PAST SURGICAL HISTOIRY:   Past Surgical History:  Procedure Laterality Date  . CHOLECYSTECTOMY    . ESOPHAGOGASTRODUODENOSCOPY    . ESOPHAGOGASTRODUODENOSCOPY  11/2011   NSAID-induced gastritis (Dr Bluford Kaufmann)  . INNER EAR SURGERY      SOCIAL HISTORY:   Social History   Tobacco Use  . Smoking status: Current Every Day Smoker    Packs/day: 0.50    Types: Cigarettes    Start date: 12/19/2009  . Smokeless tobacco: Never Used  Substance Use Topics  . Alcohol use: No    Alcohol/week: 0.0 oz    FAMILY HISTORY:   Family History  Problem Relation Age of Onset  . Heart failure Other   . Colon cancer Maternal Grandfather 50    DRUG ALLERGIES:   Allergies  Allergen Reactions  . Morphine And Related Nausea And Vomiting  .  Zofran [Ondansetron Hcl] Nausea And Vomiting    REVIEW OF SYSTEMS:  CONSTITUTIONAL: No fever, fatigue or weakness.  EYES: No blurred or double vision.  EARS, NOSE, AND THROAT: No tinnitus or ear pain.  RESPIRATORY: No cough, shortness of breath, wheezing or hemoptysis.  CARDIOVASCULAR: No chest pain, orthopnea, edema.  GASTROINTESTINAL: Reporting intractable nausea, vomiting, epigastric abdominal pain.  Denies diarrhea GENITOURINARY: No dysuria, hematuria.  ENDOCRINE: No polyuria, nocturia,  HEMATOLOGY: No anemia, easy bruising or bleeding SKIN: No rash or lesion.  Sweating a lot MUSCULOSKELETAL: No joint pain or arthritis.   NEUROLOGIC: No tingling, numbness, weakness.  PSYCHIATRY: No anxiety or depression.   MEDICATIONS AT HOME:   Prior to Admission medications   Medication Sig Start Date End Date Taking? Authorizing Provider  amLODipine (NORVASC) 10 MG tablet Take 1 tablet (10 mg total) by mouth daily. 03/22/17  Yes Auburn Bilberry, MD  cloNIDine (CATAPRES) 0.2 MG tablet Take 1 tablet (0.2 mg total) by mouth 2 (two) times daily. 04/27/17  Yes Darci Current, MD  pantoprazole (PROTONIX) 40 MG tablet Take 1 tablet (40 mg total) by mouth 2 (two) times daily. 03/22/17  Yes Auburn Bilberry, MD  hydrochlorothiazide (HYDRODIURIL) 25 MG tablet Take 1 tablet (25 mg total) by mouth daily. Patient not taking: Reported on 06/07/2017 03/22/17   Auburn Bilberry, MD  promethazine (PHENERGAN) 12.5 MG tablet Take 1 tablet (12.5 mg total) by mouth  every 6 (six) hours as needed for nausea or vomiting. 04/27/17   Darci CurrentBrown, Tony N, MD  traMADol (ULTRAM) 50 MG tablet Take 1 tablet (50 mg total) by mouth 3 (three) times daily as needed. 03/22/17   Auburn BilberryPatel, Shreyang, MD      VITAL SIGNS:  Blood pressure (!) 177/131, pulse 83, temperature 97.8 F (36.6 C), temperature source Oral, resp. rate 14, weight 90.7 kg (200 lb), SpO2 100 %.  PHYSICAL EXAMINATION:  GENERAL:  36 y.o.-year-old patient lying in the bed  with no acute distress.  EYES: Pupils equal, round, reactive to light and accommodation. No scleral icterus. Extraocular muscles intact.  HEENT: Head atraumatic, normocephalic. Oropharynx and nasopharynx clear.  NECK:  Supple, no jugular venous distention. No thyroid enlargement, no tenderness.  LUNGS: Normal breath sounds bilaterally, no wheezing, rales,rhonchi or crepitation. No use of accessory muscles of respiration.  CARDIOVASCULAR: S1, S2 normal. No murmurs, rubs, or gallops.  ABDOMEN: Soft, epigastric abdomen is tender to touch no rebound tenderness , nondistended. Bowel sounds present. No organomegaly or mass.  EXTREMITIES: No pedal edema, cyanosis, or clubbing.  NEUROLOGIC: Cranial nerves II through XII are intact. Muscle strength 5/5 in all extremities. Sensation intact. Gait not checked.  PSYCHIATRIC: The patient is alert and oriented x 3.  SKIN: No obvious rash, lesion, or ulcer.  Diaphoretic  LABORATORY PANEL:   CBC Recent Labs  Lab 06/07/17 0511  WBC 9.5  HGB 14.9  HCT 44.2  PLT 239   ------------------------------------------------------------------------------------------------------------------  Chemistries  Recent Labs  Lab 06/07/17 0511  NA 138  K 3.6  CL 105  CO2 25  GLUCOSE 100*  BUN 14  CREATININE 1.04  CALCIUM 9.3  AST 25  ALT 22  ALKPHOS 59  BILITOT 0.7   ------------------------------------------------------------------------------------------------------------------  Cardiac Enzymes Recent Labs  Lab 06/07/17 0511  TROPONINI <0.03   ------------------------------------------------------------------------------------------------------------------  RADIOLOGY:  Ct Abdomen Pelvis W Contrast  Result Date: 06/07/2017 CLINICAL DATA:  Nausea and vomiting. Passed out at work. Out of hypertension medication for 10 days. EXAM: CT ABDOMEN AND PELVIS WITH CONTRAST TECHNIQUE: Multidetector CT imaging of the abdomen and pelvis was performed using the  standard protocol following bolus administration of intravenous contrast. CONTRAST:  100mL ISOVUE-300 IOPAMIDOL (ISOVUE-300) INJECTION 61% COMPARISON:  03/20/2017 FINDINGS: Lower chest:  No contributory findings. Hepatobiliary: No focal liver abnormality.Cholecystectomy. Normal common bile duct diameter. Pancreas: Unremarkable. Spleen: Unremarkable. Adrenals/Urinary Tract: Negative adrenals. No hydronephrosis or stone. Unremarkable bladder. Stomach/Bowel:  No obstruction. No appendicitis. Vascular/Lymphatic: No acute vascular abnormality. No mass or adenopathy. Reproductive:No pathologic findings. Other: No ascites or pneumoperitoneum. Musculoskeletal: No acute abnormalities. Transitional lumbosacral vertebra numbered S1. L5 body lucency best attributed to Schmorl's node, stable. IMPRESSION: Negative exam.  No explanation for symptoms. Electronically Signed   By: Marnee SpringJonathon  Watts M.D.   On: 06/07/2017 07:20    EKG:   Orders placed or performed during the hospital encounter of 06/07/17  . ED EKG  . ED EKG  . EKG 12-Lead  . EKG 12-Lead    IMPRESSION AND PLAN:     #Intractable nausea and vomiting with a history of cyclic vomiting syndrome/might be going through narcotic withdrawal Admit to MedSurg unit IV fluids PPI Antiemetics, the patient reports that only Phenergan works IV Dilaudid for pain management GI consult CT abdomen is negative  #History of NSAID induced gastritis Avoid NSAIDs, give PPI and GI consult is placed  #Drug abuse Check urine drug screen Psychiatric consult is placed patient is requesting Suboxone  #Chronic history of  atrial fibrillation Currently rate controlled Get echocardiogram, cycle cardiac biomarkers EKG with a sinus tachycardia in the ED no acute ST-T wave changes noticed  #Essential hypertension blood pressure is elevated We will give clonidine   GI prophylaxis with Protonix DVT prophylaxis with heparin subcu    All the records are reviewed and  case discussed with ED provider. Management plans discussed with the patient, family and they are in agreement.  he reports that mom already aware of his hospital admission and does not want me to talk to his mom at this time  CODE STATUS: fc,  mom is the healthcare power of attorney  TOTAL TIME TAKING CARE OF THIS PATIENT: 42 minutes.   Note: This dictation was prepared with Dragon dictation along with smaller phrase technology. Any transcriptional errors that result from this process are unintentional.  Ramonita Lab M.D on 06/07/2017 at 10:11 AM  Between 7am to 6pm - Pager - [phone]  After 6pm go to [email or web address] - password [email or web address]  [facility]  Office  [phone]  CC: Primary care physician; Patient, No Pcp Per

## 2017-06-07 NOTE — Progress Notes (Signed)
Spoke with Dr Anne HahnWillis regarding pt's bp of 188/110.  New orders for prn labetalol. Henriette CombsSarah Wisam Siefring RN

## 2017-06-07 NOTE — ED Triage Notes (Signed)
Patient reports he has been out of his BP medications for 1 week.

## 2017-06-07 NOTE — ED Notes (Signed)
Pt calling out for help wanting to go to bathroom. Pt unable to walk but given urinal. Pt gown changed at this time, towel wrapped around pt head. Pt given call light and aware to call if he needs anything.

## 2017-06-07 NOTE — ED Notes (Signed)
Pt diaphoretic. Pt reports he feels cold and is hurting and he sweats when this happens. Dr. Mayford KnifeWilliams aware.

## 2017-06-07 NOTE — Plan of Care (Signed)
Pt to go for echo. Prns for pain and nausea and vomiting given with some relief. Dr clapacs in to see pt. Gi to see pt.

## 2017-06-07 NOTE — ED Provider Notes (Signed)
Patient is not improved after multiple doses of pain medicine and antiemetics with fluids.  He is still diaphoretic and vomiting.  He is requesting Suboxone which I have advised we do not provide in the ER.  I will discussed with the hospitalist for admission.   Emily FilbertWilliams, Catharine Kettlewell E, MD 06/07/17 [phone]

## 2017-06-07 NOTE — Plan of Care (Signed)
See previous note

## 2017-06-07 NOTE — ED Provider Notes (Addendum)
[facility] Emergency Department Provider Note   ____________________________________________   First MD Initiated Contact with Patient 06/07/17 [id number]     (approximate)  I have reviewed the triage vital signs and the nursing notes.   HISTORY  Chief Complaint Loss of Consciousness    HPI Brandon Allen is a 36 y.o. male the previous history of atrial fibrillation Patient reports that he was at work, he had been standing for some time and he started to feel lightheaded and slightly nauseated.  He went to use the bathroom to splash water in his face, and it is soon as he walked out of the bathroom he passed out.  It was witnessed and there was no seizure.  He reports that he stood up and other employees were fanning air on him, he reports he felt hot and sweaty before passing out.  He now reports feeling normal, no headache nausea or vomiting.  No chest pain or trouble breathing.  He did not feel his heart racing or have any palpitations.  Reports the same as happen once previous.  He reports he does work third shift, he had not had anything to eat or drink before going to work today feels he may be "dehydrated".  He also recently ran out of his clonidine prescription a few days ago    Past Medical History:  Diagnosis Date  . Atrial fibrillation (HCC)    Dr Mariah Milling  . 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 Deafness in right ear   . HTN (hypertension) 12/20/2014  . Kidney stones   . NSAID induced gastritis     Patient Active Problem List   Diagnosis Date Noted  . Tobacco abuse 04/06/2017  . Nausea & vomiting 03/21/2017  . Hypertensive urgency 03/21/2017  . GERD (gastroesophageal reflux disease) 11/24/2016  . Leukocytosis 01/26/2016  . Hyperglycemia 01/26/2016  . Essential hypertension 01/26/2016  . Cannabis abuse 01/26/2016  . Cyclic vomiting syndrome 01/25/2016  . Atrial fibrillation with rapid ventricular response (HCC) 12/20/2014    . HTN (hypertension) 12/20/2014    Past Surgical History:  Procedure Laterality Date  . CHOLECYSTECTOMY    . ESOPHAGOGASTRODUODENOSCOPY    . ESOPHAGOGASTRODUODENOSCOPY  11/2011   NSAID-induced gastritis (Dr Bluford Kaufmann)  . INNER EAR SURGERY      Prior to Admission medications   Medication Sig Start Date End Date Taking? Authorizing Provider  amLODipine (NORVASC) 10 MG tablet Take 1 tablet (10 mg total) by mouth daily. 03/22/17   Auburn Bilberry, MD  cloNIDine (CATAPRES) 0.2 MG tablet Take 1 tablet (0.2 mg total) by mouth 2 (two) times daily. 04/27/17   Darci Current, MD  hydrochlorothiazide (HYDRODIURIL) 25 MG tablet Take 1 tablet (25 mg total) by mouth daily. 03/22/17   Auburn Bilberry, MD  pantoprazole (PROTONIX) 40 MG tablet Take 1 tablet (40 mg total) by mouth 2 (two) times daily. 03/22/17   Auburn Bilberry, MD  promethazine (PHENERGAN) 12.5 MG tablet Take 1 tablet (12.5 mg total) by mouth every 6 (six) hours as needed for nausea or vomiting. 04/27/17   Darci Current, MD  traMADol (ULTRAM) 50 MG tablet Take 1 tablet (50 mg total) by mouth 3 (three) times daily as needed. 03/22/17   Auburn Bilberry, MD    Allergies Morphine and related and Zofran [ondansetron hcl]  Family History  Problem Relation Age of Onset  . Heart failure Other   . Colon cancer Maternal Grandfather 61    Social History  Social History   Tobacco Use  . Smoking status: Current Every Day Smoker    Packs/day: 0.50    Types: Cigarettes    Start date: 12/19/2009  . Smokeless tobacco: Never Used  Substance Use Topics  . Alcohol use: No    Alcohol/week: 0.0 oz  . Drug use: Yes    Frequency: 3.0 times per week    Types: Marijuana    Comment: Last use was 3 days ago    Review of Systems Constitutional: No fever/chills Eyes: No visual changes. ENT: No sore throat. Cardiovascular: Denies chest pain. Respiratory: Denies shortness of breath. Gastrointestinal: No abdominal pain.   No diarrhea.  No  constipation. Genitourinary: Negative for dysuria. Musculoskeletal: Negative for back pain. Skin: Negative for rash. Neurological: Negative for headaches, focal weakness or numbness.  No neck pain. Denies any injury   ____________________________________________   PHYSICAL EXAM:  VITAL SIGNS: ED Triage Vitals  Enc Vitals Group     BP 06/07/17 0517 (!) 164/109     Pulse Rate 06/07/17 0517 (!) 101     Resp 06/07/17 0517 19     Temp 06/07/17 0517 97.8 F (36.6 C)     Temp Source 06/07/17 0517 Oral     SpO2 06/07/17 0517 99 %     Weight 06/07/17 0513 200 lb (90.7 kg)     Height --      Head Circumference --      Peak Flow --      Pain Score --      Pain Loc --      Pain Edu? --      Excl. in GC? --     Constitutional: Alert and oriented. Well appearing and in no acute distress. Eyes: Conjunctivae are normal. Head: Atraumatic. Nose: No congestion/rhinnorhea. Mouth/Throat: Mucous membranes are moist. Neck: No stridor.  No midline cervical tenderness Cardiovascular: Normal rate, regular rhythm. Grossly normal heart sounds.  Good peripheral circulation. Respiratory: Normal respiratory effort.  No retractions. Lungs CTAB. Gastrointestinal: Soft and nontender. No distention. Musculoskeletal: No lower extremity tenderness nor edema. Neurologic:  Normal speech and language. No gross focal neurologic deficits are appreciated.  Skin:  Skin is warm, dry and intact. No rash noted. Psychiatric: Mood and affect are normal. Speech and behavior are normal.  ____________________________________________   LABS (all labs ordered are listed, but only abnormal results are displayed)  Labs Reviewed  BASIC METABOLIC PANEL - Abnormal; Notable for the following components:      Result Value   Glucose, Bld 100 (*)    All other components within normal limits  CBC  GLUCOSE, CAPILLARY  HEPATIC FUNCTION PANEL  LIPASE, BLOOD  TROPONIN I  URINALYSIS, COMPLETE (UACMP) WITH MICROSCOPIC  CBG  MONITORING, ED   ____________________________________________  EKG  Reviewed and entered by me at 5:20 AM Heart rate 105 QRS 100 QTC 440 Sinus tachycardia, no evidence of ischemia ____________________________________________  RADIOLOGY  No indication for CT imaging.  Denies any neurologic, cardiac or pulmonary symptoms at present. ____________________________________________   PROCEDURES  Procedure(s) performed: None  Procedures  Critical Care performed: No  ____________________________________________   INITIAL IMPRESSION / ASSESSMENT AND PLAN / ED COURSE  Pertinent labs & imaging results that were available during my care of the patient were reviewed by me and considered in my medical decision making (see chart for details).  Syncope.  The clinical history given sounds like that of possibly vasovagal or orthostatic syncope.  However, his blood pressure is notably elevated now, he  recently ran out of clonidine I suspect it is possible he may be having some rebound hypertension potentially may have passed out due to poor oral intake and not having eaten or drank anything prior to his shift.  Plan to IV hydrate him, provide his clonidine dose, and observe him closely.  He is currently neurologically intact with no complaints of any cardiac pulmonary or neurologic or ongoing symptoms.  ----------------------------------------- 7:12 AM on 06/07/2017 -----------------------------------------  Patient began to experience abdominal pain and vomiting.  I evaluated him in about 6:15 AM, and he is now sweaty and diaphoretic, reports that he is having significant pain in his mid abdomen with vomiting.  He is noted to have slightly bilious emesis at this time.  I have given him 2 doses of Phenergan, fluids, and Dilaudid for pain.  He reports that this occurs to him about once every 6 months and it seems to be associated with cyclical vomiting.  On exam he reports severe tenderness  throughout the abdomen on exam without any rebound or guarding.  Have ordered a CT scan which has been done and is pending results at this time.  Ongoing care assigned to Dr. Mayford KnifeWilliams, follow-up on CT abdomen pelvis, pain and nausea control and reassessment.  Patient did have a syncopal episode, likely secondary to be dehydrated and now I suspect is somehow aggravated his cyclical vomiting/gastritis-like symptoms.  Patient's pain and symptoms can be well controlled and CT is reassuring, I suspect he can likely be discharged home if improving.      ____________________________________________   FINAL CLINICAL IMPRESSION(S) / ED DIAGNOSES  Final diagnoses:  Vasovagal syncope  Nausea and vomiting, intractability of vomiting not specified, unspecified vomiting type      NEW MEDICATIONS STARTED DURING THIS VISIT:  This SmartLink is deprecated. Use AVSMEDLIST instead to display the medication list for a patient.   Note:  This document was prepared using Dragon voice recognition software and may include unintentional dictation errors.     Sharyn CreamerQuale, Mark, MD 06/07/17 [phone]   ----------------------------------------- 7:33 AM on 06/07/2017 -----------------------------------------  Patient awake and alert, continuing to have bilious emesis.  Reports ongoing pain and nausea.  I have ordered additional Dilaudid and Phenergan for him at this time.  Plan to provide additional hydration and continue to monitor closely.  Ongoing care under Dr. Mayford KnifeWilliams with plan for reevaluation thereafter.    Sharyn CreamerQuale, Mark, MD 06/07/17 [phone]

## 2017-06-07 NOTE — ED Triage Notes (Signed)
Patient had syncopal episode today at work. Patient reports feeling strange, becoming very warm.  Patient went to bathroom to splash water on his face and lost consciousness.

## 2017-06-07 NOTE — Consult Note (Signed)
[facility] Consult   Reason for Consult: Consult for 36 year old man with intractable vomiting.  The question was "asks for Suboxone" Referring Physician:  Gouru Patient Identification: Brandon Allen MRN:  [id number] Principal Diagnosis: Intractable nausea and vomiting Diagnosis:   Patient Active Problem List   Diagnosis Date Noted  . Intractable nausea and vomiting [R11.2] 06/07/2017  . Opiate abuse, episodic ([place]) [F11.10] 06/07/2017  . Tobacco abuse [Z72.0] 04/06/2017  . Nausea & vomiting [R11.2] 03/21/2017  . Hypertensive urgency [I16.0] 03/21/2017  . GERD (gastroesophageal reflux disease) [K21.9] 11/24/2016  . Leukocytosis [D72.829] 01/26/2016  . Hyperglycemia [R73.9] 01/26/2016  . Essential hypertension [I10] 01/26/2016  . Cannabis abuse [F12.10] 01/26/2016  . Cyclic vomiting syndrome [G43.A0] 01/25/2016  . Atrial fibrillation with rapid ventricular response ([place]) [I48.91] 12/20/2014  . HTN (hypertension) [I10] 12/20/2014    Total Time spent with patient: 1 hour  Subjective:   Brandon Allen is a 36 y.o. male patient admitted with "I fell out at work".  HPI: Patient interviewed chart reviewed.  36 year old man says he came into the hospital because he passed out at work.  After coming into the hospital he began complaining of abdominal pain and frequent vomiting.  Patient has been treated with multiple medicines including antiemetics and pain medicines since coming into the hospital.  Apparently he was asking for Suboxone.  When I came to speak with him he tells me that he wants to stop using OxyContin.  He says he has been using about 40 or 50 mg a day of OxyContin up until a few days before the hospitalization when he had tapered himself down to 15 mg.  He is asking for the Suboxone because he wants to get off of the narcotics.  He admits that he smokes marijuana daily but minimizes the amount says it is really not very much.  Patient denies depression.  Denies any  suicidal or homicidal thoughts.  Denies any psychotic symptoms.  Social history: Lives with his mother.  Says that he was working regularly until he came into the hospital.  Medical history: This young man has a long and somewhat confusing history of problems with chronic vomiting.  Looking back over his chart he has had a remarkable number of presentations to emergency rooms usually with abdominal pain nausea and vomiting.  No specific single finding made to explain any of this.  Also a history of hypertension.  Substance abuse history: Cannabis abuse has long been identified although the patient is minimizing it now.  Interesting to me I do not see that he had made any complaints or brought up any issues about opiate abuse in the past door before having me come to see him.  Now he is saying that he wants substance abuse treatment and get off of his opiates.  This despite the fact that he is still being treated with Dilaudid here in the hospital.  Past Psychiatric History: Not clear that he is really ever had specific psychiatric treatment.  Does not look like he has ever been hospitalized in the past no history of suicide attempts no history of psychotic disorder.  Risk to Self: Is patient at risk for suicide?: No Risk to Others:   Prior Inpatient Therapy:   Prior Outpatient Therapy:    Past Medical History:  Past Medical History:  Diagnosis Date  . Atrial fibrillation ([place])    Dr Rockey Situ  . 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 Deafness in right ear   .  HTN (hypertension) 12/20/2014  . Kidney stones   . NSAID induced gastritis     Past Surgical History:  Procedure Laterality Date  . CHOLECYSTECTOMY    . ESOPHAGOGASTRODUODENOSCOPY    . ESOPHAGOGASTRODUODENOSCOPY  11/2011   NSAID-induced gastritis (Dr Candace Cruise)  . INNER EAR SURGERY     Family History:  Family History  Problem Relation Age of Onset  . Heart failure Other   . Colon cancer Maternal Grandfather 39    Family Psychiatric  History: Denies any family history Social History:  Social History   Substance and Sexual Activity  Alcohol Use No  . Alcohol/week: 0.0 oz     Social History   Substance and Sexual Activity  Drug Use Yes  . Frequency: 3.0 times per week  . Types: Marijuana   Comment: Last use was 3 days ago    Social History   Socioeconomic History  . Marital status: Single    Spouse name: None  . Number of children: 2  . Years of education: None  . Highest education level: None  Social Needs  . Financial resource strain: None  . Food insecurity - worry: None  . Food insecurity - inability: None  . Transportation needs - medical: None  . Transportation needs - non-medical: None  Occupational History  . Occupation: Biomedical scientist  Tobacco Use  . Smoking status: Current Every Day Smoker    Packs/day: 0.50    Types: Cigarettes    Start date: 12/19/2009  . Smokeless tobacco: Never Used  Substance and Sexual Activity  . Alcohol use: No    Alcohol/week: 0.0 oz  . Drug use: Yes    Frequency: 3.0 times per week    Types: Marijuana    Comment: Last use was 3 days ago  . Sexual activity: Yes    Birth control/protection: None  Other Topics Concern  . None  Social History Narrative   Lives with girlfriend & children   Additional Social History:    Allergies:   Allergies  Allergen Reactions  . Morphine And Related Nausea And Vomiting  . Zofran [Ondansetron Hcl] Nausea And Vomiting    Labs:  Results for orders placed or performed during the hospital encounter of 06/07/17 (from the past 48 hour(s))  Basic metabolic panel     Status: Abnormal   Collection Time: 06/07/17  5:11 AM  Result Value Ref Range   Sodium 138 135 - 145 mmol/L   Potassium 3.6 3.5 - 5.1 mmol/L   Chloride 105 101 - 111 mmol/L   CO2 25 22 - 32 mmol/L   Glucose, Bld 100 (H) 65 - 99 mg/dL   BUN 14 6 - 20 mg/dL   Creatinine, Ser 1.04 0.61 - 1.24 mg/dL   Calcium 9.3 8.9 - 10.3 mg/dL   GFR  calc non Af Amer >60 >60 mL/min   GFR calc Af Amer >60 >60 mL/min    Comment: (NOTE) The eGFR has been calculated using the CKD EPI equation. This calculation has not been validated in all clinical situations. eGFR's persistently <60 mL/min signify possible Chronic Kidney Disease.    Anion gap 8 5 - 15  CBC     Status: None   Collection Time: 06/07/17  5:11 AM  Result Value Ref Range   WBC 9.5 3.8 - 10.6 K/uL   RBC 4.77 4.40 - 5.90 MIL/uL   Hemoglobin 14.9 13.0 - 18.0 g/dL   HCT 44.2 40.0 - 52.0 %   MCV 92.6 80.0 - 100.0 fL  MCH 31.3 26.0 - 34.0 pg   MCHC 33.8 32.0 - 36.0 g/dL   RDW 12.5 11.5 - 14.5 %   Platelets 239 150 - 440 K/uL  Hepatic function panel     Status: None   Collection Time: 06/07/17  5:11 AM  Result Value Ref Range   Total Protein 8.0 6.5 - 8.1 g/dL   Albumin 4.6 3.5 - 5.0 g/dL   AST 25 15 - 41 U/L   ALT 22 17 - 63 U/L   Alkaline Phosphatase 59 38 - 126 U/L   Total Bilirubin 0.7 0.3 - 1.2 mg/dL   Bilirubin, Direct 0.1 0.1 - 0.5 mg/dL   Indirect Bilirubin 0.6 0.3 - 0.9 mg/dL  Lipase, blood     Status: None   Collection Time: 06/07/17  5:11 AM  Result Value Ref Range   Lipase 28 11 - 51 U/L  Troponin I     Status: None   Collection Time: 06/07/17  5:11 AM  Result Value Ref Range   Troponin I <0.03 <0.03 ng/mL  Glucose, capillary     Status: None   Collection Time: 06/07/17  5:17 AM  Result Value Ref Range   Glucose-Capillary 98 65 - 99 mg/dL  Troponin I (q 6hr x 3)     Status: None   Collection Time: 06/07/17 11:17 AM  Result Value Ref Range   Troponin I <0.03 <0.03 ng/mL  Troponin I (q 6hr x 3)     Status: None   Collection Time: 06/07/17  4:08 PM  Result Value Ref Range   Troponin I <0.03 <0.03 ng/mL    Current Facility-Administered Medications  Medication Dose Route Frequency Provider Last Rate Last Dose  . 0.9 %  sodium chloride infusion   Intravenous Continuous Gouru, Aruna, MD 125 mL/hr at 06/07/17 1324    . enoxaparin (LOVENOX) injection  40 mg  40 mg Subcutaneous Q24H Gouru, Aruna, MD      . HYDROmorphone (DILAUDID) injection 0.5 mg  0.5 mg Intravenous Q4H PRN Gouru, Aruna, MD      . HYDROmorphone (DILAUDID) injection 1 mg  1 mg Intravenous Q4H PRN Gouru, Aruna, MD   1 mg at 06/07/17 1838  . pantoprazole (PROTONIX) injection 40 mg  40 mg Intravenous Q12H Gouru, Aruna, MD   40 mg at 06/07/17 1324  . promethazine (PHENERGAN) injection 12.5 mg  12.5 mg Intravenous Q6H PRN Gouru, Aruna, MD   12.5 mg at 06/07/17 1623    Musculoskeletal: Strength & Muscle Tone: decreased Gait & Station: unable to stand Patient leans: N/A  Psychiatric Specialty Exam: Physical Exam  Nursing note and vitals reviewed. Constitutional: He appears well-developed and well-nourished.  Patient's face was covered with sweat.  HENT:  Head: Normocephalic and atraumatic.  Eyes: Conjunctivae are normal. Pupils are equal, round, and reactive to light.  Neck: Normal range of motion.  Cardiovascular: Regular rhythm and normal heart sounds.  Respiratory: Effort normal.  GI: Soft.  Musculoskeletal: Normal range of motion.  Neurological: He is alert.  Skin: Skin is warm. He is diaphoretic.  Psychiatric: Judgment normal. His affect is blunt. His speech is delayed. He is slowed and withdrawn. Cognition and memory are normal. He expresses no homicidal and no suicidal ideation.    Review of Systems  Constitutional: Positive for diaphoresis.  HENT: Negative.   Eyes: Negative.   Respiratory: Negative.   Cardiovascular: Negative.   Gastrointestinal: Positive for abdominal pain, nausea and vomiting.  Musculoskeletal: Positive for falls.  Skin: Negative.  Neurological: Negative.   Psychiatric/Behavioral: Positive for substance abuse. Negative for depression, hallucinations, memory loss and suicidal ideas. The patient is nervous/anxious. The patient does not have insomnia.     Blood pressure (!) 184/108, pulse 95, temperature 98.6 F (37 C), temperature source  Oral, resp. rate 20, height 6' (1.829 m), weight 91.4 kg (201 lb 9.6 oz), SpO2 100 %.Body mass index is 27.34 kg/m.  General Appearance: Disheveled  Eye Contact:  Fair  Speech:  Slow  Volume:  Decreased  Mood:  Irritable  Affect:  Congruent  Thought Process:  Goal Directed  Orientation:  Full (Time, Place, and Person)  Thought Content:  Logical  Suicidal Thoughts:  No  Homicidal Thoughts:  No  Memory:  Immediate;   Good Recent;   Fair Remote;   Fair  Judgement:  Fair  Insight:  Fair  Psychomotor Activity:  Decreased  Concentration:  Concentration: Fair  Recall:  [employer] of Knowledge:  Fair  Language:  Fair  Akathisia:  No  Handed:  Right  AIMS (if indicated):     Assets:  Desire for Improvement Social Support  ADL's:  Intact  Cognition:  WNL  Sleep:        Treatment Plan Summary: Plan 36 year old man with what seems overall to me a somewhat confusing history.  Has long-standing multiple hospitalizations for intractable vomiting without any clear cause.  He tells me today that he has been vomiting frequently all through the day and shows me an emesis bag that has about 15 cc of green liquid in it.  Patient is asking for Suboxone.  The symptoms he is complaining of however do not match up all that well however with opiate withdrawal.  He specifically says he has not had any diarrhea or any bowel movements at all.  His only pain is in the top of his abdomen.  Furthermore as I explained to him Suboxone would be contraindicated if he is still receiving narcotics in the hospital since it would throw him into withdrawal and actually make him feel worse.  Overall I do not think Suboxone is the appropriate medicine at this point.  Suboxone is a medicine which has very strict guidelines for how it can be used in the hospital anyway.  I think it would be better at this point to manage him symptomatically and for any definite diagnosis.  I will still follow up with him to see how things are  turning out over the next day or so.  Supportive counseling no prescriptions for any medicine at this point.  Disposition: No evidence of imminent risk to self or others at present.   Patient does not meet criteria for psychiatric inpatient admission. Supportive therapy provided about ongoing stressors.  Alethia Berthold, MD 06/07/2017 7:41 PM

## 2017-06-08 ENCOUNTER — Observation Stay (HOSPITAL_BASED_OUTPATIENT_CLINIC_OR_DEPARTMENT_OTHER)
Admit: 2017-06-08 | Discharge: 2017-06-08 | Disposition: A | Discharge status: Home / Self Care | Payer: Self-pay | Attending: Internal Medicine | Admitting: Internal Medicine

## 2017-06-08 DIAGNOSIS — I5030 Unspecified diastolic (congestive) heart failure: Secondary | ICD-10-CM

## 2017-06-08 LAB — CBC
HCT: 43.1 % (ref 40.0–52.0)
Hemoglobin: 14.7 g/dL (ref 13.0–18.0)
MCH: 31.3 pg (ref 26.0–34.0)
MCHC: 34 g/dL (ref 32.0–36.0)
MCV: 92 fL (ref 80.0–100.0)
PLATELETS: 251 10*3/uL (ref 150–440)
RBC: 4.69 MIL/uL (ref 4.40–5.90)
RDW: 12.8 % (ref 11.5–14.5)
WBC: 15.6 10*3/uL — ABNORMAL HIGH (ref 3.8–10.6)

## 2017-06-08 LAB — COMPREHENSIVE METABOLIC PANEL
ALT: 16 U/L — AB (ref 17–63)
ANION GAP: 8 (ref 5–15)
AST: 22 U/L (ref 15–41)
Albumin: 4.3 g/dL (ref 3.5–5.0)
Alkaline Phosphatase: 54 U/L (ref 38–126)
BUN: 12 mg/dL (ref 6–20)
CALCIUM: 9.6 mg/dL (ref 8.9–10.3)
CHLORIDE: 110 mmol/L (ref 101–111)
CO2: 23 mmol/L (ref 22–32)
CREATININE: 0.72 mg/dL (ref 0.61–1.24)
Glucose, Bld: 121 mg/dL — ABNORMAL HIGH (ref 65–99)
Potassium: 3.2 mmol/L — ABNORMAL LOW (ref 3.5–5.1)
Sodium: 141 mmol/L (ref 135–145)
Total Bilirubin: 1.1 mg/dL (ref 0.3–1.2)
Total Protein: 7.5 g/dL (ref 6.5–8.1)

## 2017-06-08 LAB — TROPONIN I
TROPONIN I: 0.04 ng/mL — AB (ref <0.03)
Troponin I: 0.04 ng/mL (ref <0.03)

## 2017-06-08 LAB — T4, FREE: FREE T4: 0.96 ng/dL (ref 0.61–1.12)

## 2017-06-08 LAB — TSH: TSH: 0.227 u[IU]/mL — AB (ref 0.350–4.500)

## 2017-06-08 MED ORDER — KCL IN DEXTROSE-NACL 20-5-0.9 MEQ/L-%-% IV SOLN
INTRAVENOUS | Status: DC
Start: 2017-06-08 — End: 2017-06-09
  Administered 2017-06-08 – 2017-06-09 (×3): via INTRAVENOUS
  Filled 2017-06-08 (×6): qty 1000

## 2017-06-08 MED ORDER — PROMETHAZINE HCL 25 MG/ML IJ SOLN
12.5000 mg | Freq: Once | INTRAMUSCULAR | Status: AC
  Administered 2017-06-08: 10:00:00 12.5 mg via INTRAVENOUS
  Filled 2017-06-08: qty 1

## 2017-06-08 MED ORDER — NITROGLYCERIN 2 % TD OINT
0.5000 [in_us] | TOPICAL_OINTMENT | Freq: Once | TRANSDERMAL | Status: AC
  Administered 2017-06-08: 03:00:00 0.5 [in_us] via TOPICAL
  Filled 2017-06-08: qty 1

## 2017-06-08 NOTE — Progress Notes (Signed)
*  PRELIMINARY RESULTS* Echocardiogram 2D Echocardiogram has been performed.  Cristela BlueHege, Tyniah Kastens 06/08/2017, 4:47 PM

## 2017-06-08 NOTE — Plan of Care (Signed)
See note on care plan

## 2017-06-08 NOTE — Progress Notes (Signed)
CRITICAL VALUE STICKER  CRITICAL VALUE: Troponin 0.08  RECEIVER (on-site recipient of call): Henriette CombsSarah Tianna Baus RN  DATE & TIME NOTIFIED: 06/07/17, 2322 MESSENGER (representative from lab):  MD NOTIFIED: Dr Anne HahnWillis  TIME OF NOTIFICATION: 06/07/17, 2328 RESPONSE:  PRN medications ordered.

## 2017-06-08 NOTE — Progress Notes (Signed)
BP still elevated at 168/97, HR 85.  Spoke with Dr Sheryle Hailiamond and new order for nitropaste received. Henriette CombsSarah Nyle Limb RN

## 2017-06-08 NOTE — Clinical Social Work Note (Addendum)
Clinical Social Work Assessment  Patient Details  Name: Brandon Allen MRN: [id number] Date of Birth: Oct 28, 1980  Date of referral:  06/08/17               Reason for consult:  Substance Use/ETOH Abuse                Permission sought to share information with:    Permission granted to share information::     Name::        Agency::     Relationship::     Contact Information:     Housing/Transportation Living arrangements for the past 2 months:  Single Family Home Source of Information:  Patient Patient Interpreter Needed:  None Criminal Activity/Legal Involvement Pertinent to Current Situation/Hospitalization:  No - Comment as needed Significant Relationships:  Parents Lives with:  Parents Do you feel safe going back to the place where you live?  Yes Need for family participation in patient care:  No (Coment)  Care giving concerns:  Patient lives with his mother Brandon Allen [phone]) and son in [place].   Social Worker assessment / plan:  Holiday representative (CSW) received consult to assess for needs. Social work Theatre manager met with patient alone at bedside. Patient was alert and oriented x4. Social work Theatre manager introduced self and explained the [place]. Patient stated that he lives in [place] with his son and mother and she is his primary contact. Patient stated that he does smoke marijuana and take oxycodone but only when his stomach hurts. Social work Theatre manager presented an [employer] the [employer]. Patient stated he would take a look at the list at a later time. Patient plans to return home after discharge. Patient reported no other needs or concerns at this time. CSW and social work Theatre manager will continue to follow up and assist.  Employment status:  Unemployed Insurance information:  Self Pay (Medicaid Pending) PT Recommendations:  Not assessed at this time Information / Referral to community resources:     Patient/Family's Response to care: Patient  is agreeable to review the resource list at a later time.  Patient/Family's Understanding of and Emotional Response to Diagnosis, Current Treatment, and Prognosis:  Patient was pleasant and thanked social work Theatre manager for her assistance.  Emotional Assessment Appearance:  Appears stated age Attitude/Demeanor/Rapport:    Affect (typically observed):  Stable, Defensive, Pleasant Orientation:  Oriented to Self, Oriented to Place, Oriented to  Time, Oriented to Situation Alcohol / Substance use:  Illicit Drugs Psych involvement (Current and /or in the community):  No (Comment)  Discharge Needs  Concerns to be addressed:  Substance Abuse Concerns Readmission within the last 30 days:  No Current discharge risk:  Substance Abuse Barriers to Discharge:  Continued Medical Work up   Smith Mince, Student-Social Work 06/08/2017, 12:05 PM

## 2017-06-08 NOTE — Progress Notes (Signed)
Paged Dr Sheryle Hailiamond with new BP result 168/97, HR 85. Discussed what prn medication to give.  Order to give nitropaste received. Henriette CombsSarah Tayari Yankee RN

## 2017-06-08 NOTE — Consult Note (Signed)
[name], MD [address], [address], [name], [place], [address] [address], [address], [name], [place], [address] Phone: [phone]  Fax: [phone]  Consultation  Referring Provider:     Dr. Amado Coe Primary Care Physician:  Patient, No Pcp Per Primary Gastroenterologist:  Dr. Maximino Greenland         Reason for Consultation:     N/V  Date of Admission:  06/07/2017 Date of Consultation:  06/08/2017         HPI:   Brandon Allen is a 36 y.o. male presents with hx of cyclical vomiting syndrome admitted with nausea and vomiting, with repeated episodes of the same in the past and would daily marijuana use at home. Patient reports admissions for the same symptoms every 4-6 months with previous workup including endoscopy, CAT scans, unrevealing of any other etiology for his symptoms. Patient denies any other drug use at home except opioids that he gets on the street. States until January and February he was taking it as a prescription daily but when he stopped getting and he takes it about 2-3 times a week when he can get it. His last dose he states was about a week ago. He has no abdominal pain, and on my evaluation 110 drinks symptoms around, but was nauseous and had emesis. No hematemesis. No anemia on lab work up, white count is mildly elevated. CT scan has been normal on this admission and on his last imaging in August 2018. He had an EGD in November 2017 for similar symptoms that showed gastritis. Stool for H. pylori was positive and triple therapy was prescribed at that time.  Past Medical History:  Diagnosis Date  . Atrial fibrillation (HCC)    Dr Mariah Milling  . 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 Deafness in right ear   . HTN (hypertension) 12/20/2014  . Kidney stones   . NSAID induced gastritis     Past Surgical History:  Procedure Laterality Date  . CHOLECYSTECTOMY    . ESOPHAGOGASTRODUODENOSCOPY    . ESOPHAGOGASTRODUODENOSCOPY  11/2011   NSAID-induced  gastritis (Dr Bluford Kaufmann)  . INNER EAR SURGERY      Prior to Admission medications   Medication Sig Start Date End Date Taking? Authorizing Provider  amLODipine (NORVASC) 10 MG tablet Take 1 tablet (10 mg total) by mouth daily. 03/22/17  Yes Auburn Bilberry, MD  cloNIDine (CATAPRES) 0.2 MG tablet Take 1 tablet (0.2 mg total) by mouth 2 (two) times daily. 04/27/17  Yes Darci Current, MD  pantoprazole (PROTONIX) 40 MG tablet Take 1 tablet (40 mg total) by mouth 2 (two) times daily. 03/22/17  Yes Auburn Bilberry, MD  hydrochlorothiazide (HYDRODIURIL) 25 MG tablet Take 1 tablet (25 mg total) by mouth daily. Patient not taking: Reported on 06/07/2017 03/22/17   Auburn Bilberry, MD  promethazine (PHENERGAN) 12.5 MG tablet Take 1 tablet (12.5 mg total) by mouth every 6 (six) hours as needed for nausea or vomiting. 04/27/17   Darci Current, MD  traMADol (ULTRAM) 50 MG tablet Take 1 tablet (50 mg total) by mouth 3 (three) times daily as needed. 03/22/17   Auburn Bilberry, MD    Family History  Problem Relation Age of Onset  . Heart failure Other   . Colon cancer Maternal Grandfather 40     Social History   Tobacco Use  . Smoking status: Current Every Day Smoker    Packs/day: 0.50    Types: Cigarettes    Start  date: 12/19/2009  . Smokeless tobacco: Never Used  Substance Use Topics  . Alcohol use: No    Alcohol/week: 0.0 oz  . Drug use: Yes    Frequency: 3.0 times per week    Types: Marijuana    Comment: Last use was 3 days ago    Allergies as of 06/07/2017 - Review Complete 06/07/2017  Allergen Reaction Noted  . Morphine and related Nausea And Vomiting 12/19/2014  . Zofran [ondansetron hcl] Nausea And Vomiting 06/07/2017    Review of Systems:    All systems reviewed and negative except where noted in HPI.   Physical Exam:  Vital signs in last 24 hours: Temp:  [98.1 F (36.7 C)-98.8 F (37.1 C)] 98.6 F (37 C) (11/06 0801) Pulse Rate:  [70-101] 88 (11/06 0801) Resp:  [16-18] 18  (11/06 0801) BP: (155-202)/(93-118) 183/96 (11/06 0801) SpO2:  [96 %-100 %] 100 % (11/06 0801) Last BM Date: 06/07/17 General:   Pleasant, cooperative in NAD, diaphoretic Head:  Normocephalic and atraumatic. Eyes:   No icterus.   Conjunctiva pink. PERRLA. Ears:  Normal auditory acuity. Neck:  Supple; no masses or thyroidomegaly Lungs: Respirations even and unlabored. Lungs clear to auscultation bilaterally.   No wheezes, crackles, or rhonchi.  Heart:  Regular rate and rhythm;  Without murmur, clicks, rubs or gallops Abdomen:  Soft, nondistended, nontender. Normal bowel sounds. No appreciable masses or hepatomegaly.  No rebound or guarding.  Rectal:  Not performed. Msk:  Symmetrical without gross deformities.  Strength 5 over 5 upper extremity and lower extremities bilaterally Extremities:  Without edema, cyanosis or clubbing. Neurologic:  Alert and oriented x3;  grossly normal neurologically. Skin:  Intact without significant lesions or rashes. Cervical Nodes:  No significant cervical adenopathy. Psych:  Alert and cooperative. Normal affect.  LAB RESULTS: Recent Labs    06/07/17 0511 06/08/17 0628  WBC 9.5 15.6*  HGB 14.9 14.7  HCT 44.2 43.1  PLT 239 251   BMET Recent Labs    06/07/17 0511 06/08/17 0628  NA 138 141  K 3.6 3.2*  CL 105 110  CO2 25 23  GLUCOSE 100* 121*  BUN 14 12  CREATININE 1.04 0.72  CALCIUM 9.3 9.6   LFT Recent Labs    06/07/17 0511 06/08/17 0628  PROT 8.0 7.5  ALBUMIN 4.6 4.3  AST 25 22  ALT 22 16*  ALKPHOS 59 54  BILITOT 0.7 1.1  BILIDIR 0.1  --   IBILI 0.6  --    PT/INR No results for input(s): LABPROT, INR in the last 72 hours.  STUDIES: Ct Abdomen Pelvis W Contrast  Result Date: 06/07/2017 CLINICAL DATA:  Nausea and vomiting. Passed out at work. Out of hypertension medication for 10 days. EXAM: CT ABDOMEN AND PELVIS WITH CONTRAST TECHNIQUE: Multidetector CT imaging of the abdomen and pelvis was performed using the standard  protocol following bolus administration of intravenous contrast. CONTRAST:  ISOVUE-300 IOPAMIDOL (ISOVUE-300) INJECTION 61% COMPARISON:  03/20/2017 FINDINGS: Lower chest:  No contributory findings. Hepatobiliary: No focal liver abnormality.Cholecystectomy. Normal common bile duct diameter. Pancreas: Unremarkable. Spleen: Unremarkable. Adrenals/Urinary Tract: Negative adrenals. No hydronephrosis or stone. Unremarkable bladder. Stomach/Bowel:  No obstruction. No appendicitis. Vascular/Lymphatic: No acute vascular abnormality. No mass or adenopathy. Reproductive:No pathologic findings. Other: No ascites or pneumoperitoneum. Musculoskeletal: No acute abnormalities. Transitional lumbosacral vertebra numbered S1. L5 body lucency best attributed to Schmorl's node, stable. IMPRESSION: Negative exam.  No explanation for symptoms. Electronically Signed   By: Marnee Spring M.D.   On:  06/07/2017 07:20      Impression / Plan:   Lelon HuhKevin Schowalter is a 36 y.o. y/o male with history of cyclical vomiting syndrome, daily marijuana use, opioid abuse with intermittent admissions for nausea and vomiting with imaging, endoscopies and other workup being negative thus far admitted with nausea and vomiting and diaphoresis  CT on this admission does not show any etiology of his symptoms No small bowel obstruction present, no masses seen in CT EGD done last year showed gastritis His current symptoms are likely related to opioid withdrawal. Agree with urine tox testing Patient will need symptomatic management with anti-emetics until symptoms improve and manage for drug withdrawal Agree with psych consult and set up with detox prior to discharge. Patient would like to start on Suboxone to be able to get off of drugs, would recommend getting this set up for him. Mild elevation in white cell count could be due to his drug withdrawal. If white count continues to increase her patient develops fever consider pancultures Replete  electrolytes as necessary  Can continue PPI to treat any underlying esophagitis or gastritis. Can change this to once daily dosing or oral dosing as patient tolerates Can obtain stool for H. pylori antigen as an outpatient in the future to come from eradication from his previous treatment last year. In the setting of active PPI use stool testing may be false-negative. No indication for endoscopic intervention at this time with normal CT scan, no anemia, and presence of other etiologies for his current symptoms  Thank you for involving me in the care of this patient.      LOS: 0 days   Pasty SpillersVarnita B Lyan Moyano, MD  06/08/2017, 1:45 PM

## 2017-06-08 NOTE — Progress Notes (Signed)
[facility] [place] [facility]   PATIENT NAME: Brandon Allen    MR#:  [id number]  DATE OF BIRTH:  04/30/1981  SUBJECTIVE:  CHIEF COMPLAINT: Patient with less nausea and vomiting today.  Hungry and wants to try clear liquids today.  Still having abdominal pain in the epigastric area  REVIEW OF SYSTEMS:  CONSTITUTIONAL: No fever, fatigue or weakness.  EYES: No blurred or double vision.  EARS, NOSE, AND THROAT: No tinnitus or ear pain.  RESPIRATORY: No cough, shortness of breath, wheezing or hemoptysis.  CARDIOVASCULAR: No chest pain, orthopnea, edema.  GASTROINTESTINAL: Improving nausea, vomiting, denies diarrhea or abdominal pain.  GENITOURINARY: No dysuria, hematuria.  ENDOCRINE: No polyuria, nocturia,  HEMATOLOGY: No anemia, easy bruising or bleeding SKIN: No rash or lesion. MUSCULOSKELETAL: No joint pain or arthritis.   NEUROLOGIC: No tingling, numbness, weakness.  PSYCHIATRY: No anxiety or depression.   DRUG ALLERGIES:   Allergies  Allergen Reactions  . Morphine And Related Nausea And Vomiting  . Zofran [Ondansetron Hcl] Nausea And Vomiting    VITALS:  Blood pressure (!) 143/86, pulse 83, temperature 98 F (36.7 C), temperature source Oral, resp. rate 18, height 6' (1.829 m), weight 91.4 kg (201 lb 9.6 oz), SpO2 100 %.  PHYSICAL EXAMINATION:  GENERAL:  36 y.o.-year-old patient lying in the bed with no acute distress.  EYES: Pupils equal, round, reactive to light and accommodation. No scleral icterus. Extraocular muscles intact.  HEENT: Head atraumatic, normocephalic. Oropharynx and nasopharynx clear.  NECK:  Supple, no jugular venous distention. No thyroid enlargement, no tenderness.  LUNGS: Normal breath sounds bilaterally, no wheezing, rales,rhonchi or crepitation. No use of accessory muscles of respiration.  CARDIOVASCULAR: S1, S2 normal. No murmurs, rubs, or gallops.  ABDOMEN: Soft, has minimal epigastric tenderness, no rebound tenderness,  nondistended. Bowel sounds present. No organomegaly or mass.  EXTREMITIES: No pedal edema, cyanosis, or clubbing.  NEUROLOGIC: Cranial nerves II through XII are intact. Muscle strength 5/5 in all extremities. Sensation intact. Gait not checked.  PSYCHIATRIC: The patient is alert and oriented x 3.  SKIN: No obvious rash, lesion, or ulcer.    LABORATORY PANEL:   CBC Recent Labs  Lab 06/08/17 0628  WBC 15.6*  HGB 14.7  HCT 43.1  PLT 251   ------------------------------------------------------------------------------------------------------------------  Chemistries  Recent Labs  Lab 06/08/17 0628  NA 141  K 3.2*  CL 110  CO2 23  GLUCOSE 121*  BUN 12  CREATININE 0.72  CALCIUM 9.6  AST 22  ALT 16*  ALKPHOS 54  BILITOT 1.1   ------------------------------------------------------------------------------------------------------------------  Cardiac Enzymes Recent Labs  Lab 06/08/17 1350  TROPONINI 0.04*   ------------------------------------------------------------------------------------------------------------------  RADIOLOGY:  Ct Abdomen Pelvis W Contrast  Result Date: 06/07/2017 CLINICAL DATA:  Nausea and vomiting. Passed out at work. Out of hypertension medication for 10 days. EXAM: CT ABDOMEN AND PELVIS WITH CONTRAST TECHNIQUE: Multidetector CT imaging of the abdomen and pelvis was performed using the standard protocol following bolus administration of intravenous contrast. CONTRAST:  100mL ISOVUE-300 IOPAMIDOL (ISOVUE-300) INJECTION 61% COMPARISON:  03/20/2017 FINDINGS: Lower chest:  No contributory findings. Hepatobiliary: No focal liver abnormality.Cholecystectomy. Normal common bile duct diameter. Pancreas: Unremarkable. Spleen: Unremarkable. Adrenals/Urinary Tract: Negative adrenals. No hydronephrosis or stone. Unremarkable bladder. Stomach/Bowel:  No obstruction. No appendicitis. Vascular/Lymphatic: No acute vascular abnormality. No mass or adenopathy.  Reproductive:No pathologic findings. Other: No ascites or pneumoperitoneum. Musculoskeletal: No acute abnormalities. Transitional lumbosacral vertebra numbered S1. L5 body lucency best attributed to Schmorl's node, stable. IMPRESSION: Negative exam.  No explanation for symptoms. Electronically Signed   By: Marnee SpringJonathon  Watts M.D.   On: 06/07/2017 07:20    EKG:   Orders placed or performed during the hospital encounter of 06/07/17  . ED EKG  . ED EKG  . EKG 12-Lead  . EKG 12-Lead    ASSESSMENT AND PLAN:    #Intractable nausea and vomiting with a history of cyclic vomiting syndrome/might be going through narcotic withdrawal IV fluids PPI Antiemetics, the patient reports that only Phenergan works IV Dilaudid for pain management GI consult appreciated.  Pt had an EGD last year which has revealed gastritis CT abdomen is negative Can continue PPI to treat any underlying esophagitis or gastritis.  Patient needs to get  stool for H. pylori antigen as an outpatient in the future to come from eradication from his previous treatment last year. In the setting of active PPI use stool testing may be false-negative  #History of NSAID induced gastritis Avoid NSAIDs, give PPI and f/u GI consult   #Drug abuse Ordered  urine drug screen, still pending Psychiatric consult , recommended outpatient follow-up and not considering Suboxone at this time  #Chronic history of atrial fibrillation Currently rate controlled Troponins are negative no significant trend  Echocardiogram results are pending EKG with a sinus tachycardia in the ED no acute ST-T wave changes noticed  #Essential hypertension blood pressure is better  clonidine   GI prophylaxis with Protonix      All the records are reviewed and case discussed with Care Management/Social Workerr. Management plans discussed with the patient, family and they are in agreement.  CODE STATUS: fc   TOTAL TIME TAKING CARE OF THIS PATIENT: 35   minutes.   POSSIBLE D/C IN 1-2 DAYS, DEPENDING ON CLINICAL CONDITION.  Note: This dictation was prepared with Dragon dictation along with smaller phrase technology. Any transcriptional errors that result from this process are unintentional.   Ramonita LabGouru, Sherrell Weir M.D on 06/08/2017 at 3:48 PM  Between 7am to 6pm - Pager - [phone] After 6pm go to [email or web address] - password [email or web address]  [facility]  Office  [phone]  CC: Primary care physician; Patient, No Pcp Per

## 2017-06-09 LAB — ECHOCARDIOGRAM COMPLETE
Height: 72 in
Weight: 3225.6 oz

## 2017-06-09 MED ORDER — AMLODIPINE BESYLATE 10 MG PO TABS
10.0000 mg | ORAL_TABLET | Freq: Every day | ORAL | Status: DC
  Filled 2017-06-09: qty 1

## 2017-06-09 MED ORDER — CLONIDINE HCL 0.1 MG PO TABS
0.1000 mg | ORAL_TABLET | Freq: Two times a day (BID) | ORAL | Status: DC
  Administered 2017-06-09: 10:00:00 0.1 mg via ORAL
  Filled 2017-06-09: qty 1

## 2017-06-09 MED ORDER — PROMETHAZINE HCL 12.5 MG PO TABS: 12.5000 mg | ORAL_TABLET | Freq: Four times a day (QID) | ORAL | 0 refills | Status: DC | PRN

## 2017-06-09 MED ORDER — TRAMADOL HCL 50 MG PO TABS: 50.0000 mg | ORAL_TABLET | Freq: Three times a day (TID) | ORAL | 0 refills | Status: DC | PRN

## 2017-06-09 MED ORDER — CLONIDINE HCL 0.2 MG PO TABS: 0.2000 mg | ORAL_TABLET | Freq: Two times a day (BID) | ORAL | 0 refills | Status: DC

## 2017-06-09 MED ORDER — PANTOPRAZOLE SODIUM 40 MG PO TBEC: 40.0000 mg | DELAYED_RELEASE_TABLET | Freq: Two times a day (BID) | ORAL | 0 refills | Status: DC

## 2017-06-09 NOTE — Discharge Instructions (Signed)
Follow-up with primary care physician in 5-7 days or sooner as needed Follow-up with gastroenterology in 2-3 weeks Follow-up with outpatient psychiatry [facility]

## 2017-06-09 NOTE — Care Management Note (Signed)
Case Management Note  Patient Details  Name: Brandon Allen MRN: [id number] Date of Birth:   Subjective/Objective:    Admitted to [facility] under observation status with the diagnosis of intractable nausea and vomiting. Lives with mother. Grandmother is Genelle BalHelen Graves [phone]). [facility]. States he hasn't seen a primary care physician in many years. No insurance. [place] and works full time at [employer] not quiet a year at this time. Makes $10.00 hour. Never been to the [facility].  Takes care of all basic and instrumental activities of daily living himself, drives.                 Action/Plan: [facility]. Information about [facility] and [facility] given   Expected Discharge Date:  06/09/17               Expected Discharge Plan:     In-House Referral:     Discharge planning Services     Post Acute Care Choice:    Choice offered to:     DME Arranged:    DME Agency:     HH Arranged:    HH Agency:     Status of Service:     If discussed at [employer] Length of Stay Meetings, dates discussed:    Additional Comments:  Gwenette GreetBrenda S Shoshanna Mcquitty, RN MSN CCM Care Management [phone] 06/09/2017, 10:48 AM

## 2017-06-09 NOTE — Progress Notes (Signed)
[facility]         [place], [place]   06/09/2017  Patient: Brandon Allen   Date of Birth:  04/21/1981  Date of admission:  06/07/2017  Date of Discharge  06/09/2017    To Whom it May Concern:   Brandon Allen  may return to work on 06/10/17.  PHYSICAL ACTIVITY:  Full  If you have any questions or concerns, please don't hesitate to call.  Sincerely,   Ramonita LabGouru, Zaydan Papesh M.D Pager [phone]- [phone] Office : [phone]   .

## 2017-06-09 NOTE — Progress Notes (Addendum)
Pt is being discharged home. Discharge papers given and explained to pt. Pt verbalized understanding. Meds and f/u appointments reviewed with pt. RX and work note given.

## 2017-06-09 NOTE — Discharge Summary (Signed)
[facility] - [place] at [facility]   PATIENT NAME: Brandon Allen    MR#:  [id number]  DATE OF BIRTH:  1980-10-05  DATE OF ADMISSION:  06/07/2017 ADMITTING PHYSICIAN: Ramonita Lab, MD  DATE OF DISCHARGE: 06/09/17  PRIMARY CARE PHYSICIAN: Patient, No Pcp Per    ADMISSION DIAGNOSIS:  Vasovagal syncope [R55] Nausea and vomiting, intractability of vomiting not specified, unspecified vomiting type [R11.2]  DISCHARGE DIAGNOSIS:  Principal Problem:   Intractable nausea and vomiting Active Problems:   Cannabis abuse   Opiate abuse, episodic (HCC)   SECONDARY DIAGNOSIS:   Past Medical History:  Diagnosis Date  . Atrial fibrillation (HCC)    Dr Mariah Milling  . 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 Deafness in right ear   . HTN (hypertension) 12/20/2014  . Kidney stones   . NSAID induced gastritis     HOSPITAL COURSE:   HPI  Brandon Allen  is a 36 y.o. male with a known history of cyclic vomiting syndrome, chronic atrial fibrillation sees Dr. Mariah Milling, essential hypertension, NSAID induced gastritis is presenting to the ED with a chief complaint of intractable nausea and vomiting associated with epigastric abdominal pain.  Patient takes Percocet at home.  He admits to smoking marijuana, patient was given several doses of Phenergan and Dilaudid with no improvement and patient is very diaphoretic, reports chest pain while vomiting.  Denies any diarrhea     #Intractable nausea and vomiting with a history of gastritis and cyclic vomiting syndrome/ Patient is clinically improving, tolerated advance diet wants to go home Given IV fluids during the hospital course, continue PPI Antiemetics, the patient reports that only Phenergan works IV Dilaudid for pain management GI consult appreciated.  Pt had an EGD last year which has revealed gastritis CT abdomen is negative Can continue PPI to treat any underlying esophagitisorgastritis.  Patient needs to get  stool for H. pylori antigen as an outpatient in the future to come from eradication from his previous treatment last year. In the setting of active PPI use stool testing may be false-negative  #History of NSAID induced gastritis Avoid NSAIDs, give PPI and f/u GI consult   #Drug abuse Ordered  urine drug screen, still pending Psychiatric consult , recommended outpatient follow-up and not considering Suboxone at this time  #Chronic history of atrial fibrillation Currently rate controlled Troponins are negative no significant trend  Echocardiogram results are pending, primary care physician to follow-up on the pending echocardiogram results EKG with a sinus tachycardia in the ED no acute ST-T wave changes noticed  #Essential hypertension blood pressure is better  clonidine   GI prophylaxis with Protonix     DISCHARGE CONDITIONS:   STABLE CONSULTS OBTAINED:  Treatment Team:  Pasty Spillers, MD Clapacs, Jackquline Denmark, MD   PROCEDURES NONE   DRUG ALLERGIES:   Allergies  Allergen Reactions  . Morphine And Related Nausea And Vomiting  . Zofran [Ondansetron Hcl] Nausea And Vomiting    DISCHARGE MEDICATIONS:   Current Discharge Medication List    CONTINUE these medications which have CHANGED   Details  cloNIDine (CATAPRES) 0.2 MG tablet Take 1 tablet (0.2 mg total) 2 (two) times daily by mouth. Qty: 60 tablet, Refills: 0    pantoprazole (PROTONIX) 40 MG tablet Take 1 tablet (40 mg total) 2 (two) times daily by mouth. Qty: 60 tablet, Refills: 0    promethazine (PHENERGAN) 12.5 MG tablet Take 1 tablet (12.5 mg total) every 6 (six) hours  as needed by mouth for nausea or vomiting. Qty: 20 tablet, Refills: 0    traMADol (ULTRAM) 50 MG tablet Take 1 tablet (50 mg total) 3 (three) times daily as needed by mouth. Qty: 15 tablet, Refills: 0      STOP taking these medications     amLODipine (NORVASC) 10 MG tablet      hydrochlorothiazide (HYDRODIURIL) 25 MG  tablet          DISCHARGE INSTRUCTIONS:   Follow-up with primary care physician in 5-7 days or sooner as needed Follow-up with gastroenterology in 2-3 weeks Follow-up with outpatient psychiatry [facility]   DIET:  Cardiac diet  DISCHARGE CONDITION:  Stable  ACTIVITY:  Activity as tolerated  OXYGEN:  Home Oxygen: No.   Oxygen Delivery: room air  DISCHARGE LOCATION:  home   If you experience worsening of your admission symptoms, develop shortness of breath, life threatening emergency, suicidal or homicidal thoughts you must seek medical attention immediately by calling 911 or calling your MD immediately  if symptoms less severe.  You Must read complete instructions/literature along with all the possible adverse reactions/side effects for all the Medicines you take and that have been prescribed to you. Take any new Medicines after you have completely understood and accpet all the possible adverse reactions/side effects.   Please note  You were cared for by a hospitalist during your hospital stay. If you have any questions about your discharge medications or the care you received while you were in the hospital after you are discharged, you can call the unit and asked to speak with the hospitalist on call if the hospitalist that took care of you is not available. Once you are discharged, your primary care physician will handle any further medical issues. Please note that NO REFILLS for any discharge medications will be authorized once you are discharged, as it is imperative that you return to your primary care physician (or establish a relationship with a primary care physician if you do not have one) for your aftercare needs so that they can reassess your need for medications and monitor your lab values.     Today  Chief Complaint  Patient presents with  . Loss of Consciousness   Patient is doing fine.  Tolerating advanced diet.  Had soft diet for lunch wants to go home   reports  improving abdominal pain  ROS:  CONSTITUTIONAL: Denies fevers, chills. Denies any fatigue, weakness.  EYES: Denies blurry vision, double vision, eye pain. EARS, NOSE, THROAT: Denies tinnitus, ear pain, hearing loss. RESPIRATORY: Denies cough, wheeze, shortness of breath.  CARDIOVASCULAR: Denies chest pain, palpitations, edema.  GASTROINTESTINAL: Denies nausea, vomiting, diarrhea, abdominal pain. Denies bright red blood per rectum. GENITOURINARY: Denies dysuria, hematuria. ENDOCRINE: Denies nocturia or thyroid problems. HEMATOLOGIC AND LYMPHATIC: Denies easy bruising or bleeding. SKIN: Denies rash or lesion. MUSCULOSKELETAL: Denies pain in neck, back, shoulder, knees, hips or arthritic symptoms.  NEUROLOGIC: Denies paralysis, paresthesias.  PSYCHIATRIC: Denies anxiety or depressive symptoms.   VITAL SIGNS:  Blood pressure (!) 142/80, pulse 79, temperature 98 F (36.7 C), temperature source Oral, resp. rate 18, height 6' (1.829 m), weight 91.4 kg (201 lb 9.6 oz), SpO2 100 %.  I/O:    Intake/Output Summary (Last 24 hours) at 06/09/2017 1444 Last data filed at 06/09/2017 1006 Gross per 24 hour  Intake 2347 ml  Output -  Net 2347 ml    PHYSICAL EXAMINATION:  GENERAL:  36 y.o.-year-old patient lying in the bed with no acute distress.  EYES: Pupils equal, round, reactive to light and accommodation. No scleral icterus. Extraocular muscles intact.  HEENT: Head atraumatic, normocephalic. Oropharynx and nasopharynx clear.  NECK:  Supple, no jugular venous distention. No thyroid enlargement, no tenderness.  LUNGS: Normal breath sounds bilaterally, no wheezing, rales,rhonchi or crepitation. No use of accessory muscles of respiration.  CARDIOVASCULAR: S1, S2 normal. No murmurs, rubs, or gallops.  ABDOMEN: Soft, non-tender, non-distended. Bowel sounds present. No organomegaly or mass.  EXTREMITIES: No pedal edema, cyanosis, or clubbing.  NEUROLOGIC: Cranial nerves II through XII are intact.  Muscle strength 5/5 in all extremities. Sensation intact. Gait not checked.  PSYCHIATRIC: The patient is alert and oriented x 3.  SKIN: No obvious rash, lesion, or ulcer.   DATA REVIEW:   CBC Recent Labs  Lab 06/08/17 0628  WBC 15.6*  HGB 14.7  HCT 43.1  PLT 251    Chemistries  Recent Labs  Lab 06/08/17 0628  NA 141  K 3.2*  CL 110  CO2 23  GLUCOSE 121*  BUN 12  CREATININE 0.72  CALCIUM 9.6  AST 22  ALT 16*  ALKPHOS 54  BILITOT 1.1    Cardiac Enzymes Recent Labs  Lab 06/08/17 1350  TROPONINI 0.04*    Microbiology Results  No results found for this or any previous visit.  RADIOLOGY:  Ct Abdomen Pelvis W Contrast  Result Date: 06/07/2017 CLINICAL DATA:  Nausea and vomiting. Passed out at work. Out of hypertension medication for 10 days. EXAM: CT ABDOMEN AND PELVIS WITH CONTRAST TECHNIQUE: Multidetector CT imaging of the abdomen and pelvis was performed using the standard protocol following bolus administration of intravenous contrast. CONTRAST:  100mL ISOVUE-300 IOPAMIDOL (ISOVUE-300) INJECTION 61% COMPARISON:  03/20/2017 FINDINGS: Lower chest:  No contributory findings. Hepatobiliary: No focal liver abnormality.Cholecystectomy. Normal common bile duct diameter. Pancreas: Unremarkable. Spleen: Unremarkable. Adrenals/Urinary Tract: Negative adrenals. No hydronephrosis or stone. Unremarkable bladder. Stomach/Bowel:  No obstruction. No appendicitis. Vascular/Lymphatic: No acute vascular abnormality. No mass or adenopathy. Reproductive:No pathologic findings. Other: No ascites or pneumoperitoneum. Musculoskeletal: No acute abnormalities. Transitional lumbosacral vertebra numbered S1. L5 body lucency best attributed to Schmorl's node, stable. IMPRESSION: Negative exam.  No explanation for symptoms. Electronically Signed   By: Marnee SpringJonathon  Watts M.D.   On: 06/07/2017 07:20    EKG:   Orders placed or performed during the hospital encounter of 06/07/17  . ED EKG  . ED EKG  .  EKG 12-Lead  . EKG 12-Lead      Management plans discussed with the patient, MOM and they are in agreement.  CODE STATUS:     Code Status Orders  (From admission, onward)        Start     Ordered   06/07/17 1143  Full code  Continuous     06/07/17 1142    Code Status History    Date Active Date Inactive Code Status Order ID Comments User Context   03/21/2017 03:58 03/22/2017 15:17 Full Code [id number]  Ihor AustinPyreddy, Pavan, MD Inpatient   11/25/2016 01:27 11/26/2016 15:54 Full Code [id number]  Oralia ManisWillis, David, MD Inpatient   06/23/2016 10:48 06/25/2016 20:27 Full Code [id number]  Arnaldo Nataliamond, Michael S, MD Inpatient   01/25/2016 20:57 01/26/2016 16:20 Full Code [id number]  Arnaldo Nataliamond, Michael S, MD Inpatient   12/20/2014 03:27 12/21/2014 18:47 Full Code [id number]  Hower, Cletis Athensavid K, MD ED      TOTAL TIME TAKING CARE OF THIS PATIENT: 43 minutes.   Note: This dictation was prepared with Dragon dictation along with smaller phrase  technology. Any transcriptional errors that result from this process are unintentional.   @[facility] @  on 06/09/2017 at 2:44 PM  Between 7am to 6pm - Pager - [phone]  After 6pm go to [email or web address] - password [email or web address]  Fabio Neighborsagle [place] [place] [facility]  Office  [phone]  CC: Primary care physician; Patient, No Pcp Per

## 2018-02-10 ENCOUNTER — Emergency Department: Payer: No Typology Code available for payment source

## 2018-02-10 ENCOUNTER — Emergency Department
Admission: EM | Admit: 2018-02-10 | Discharge: 2018-02-10 | Disposition: A | Discharge status: Home / Self Care | Payer: No Typology Code available for payment source | Attending: Emergency Medicine | Admitting: Emergency Medicine

## 2018-02-10 ENCOUNTER — Encounter: Payer: Self-pay | Admitting: Emergency Medicine

## 2018-02-10 ENCOUNTER — Other Ambulatory Visit: Payer: Self-pay

## 2018-02-10 DIAGNOSIS — S20212A Contusion of left front wall of thorax, initial encounter: Secondary | ICD-10-CM | POA: Insufficient documentation

## 2018-02-10 DIAGNOSIS — Y999 Unspecified external cause status: Secondary | ICD-10-CM | POA: Insufficient documentation

## 2018-02-10 DIAGNOSIS — S161XXA Strain of muscle, fascia and tendon at neck level, initial encounter: Secondary | ICD-10-CM | POA: Diagnosis not present

## 2018-02-10 DIAGNOSIS — F17210 Nicotine dependence, cigarettes, uncomplicated: Secondary | ICD-10-CM | POA: Insufficient documentation

## 2018-02-10 DIAGNOSIS — Z79899 Other long term (current) drug therapy: Secondary | ICD-10-CM | POA: Diagnosis not present

## 2018-02-10 DIAGNOSIS — S46912A Strain of unspecified muscle, fascia and tendon at shoulder and upper arm level, left arm, initial encounter: Secondary | ICD-10-CM

## 2018-02-10 DIAGNOSIS — I10 Essential (primary) hypertension: Secondary | ICD-10-CM | POA: Insufficient documentation

## 2018-02-10 DIAGNOSIS — S199XXA Unspecified injury of neck, initial encounter: Secondary | ICD-10-CM | POA: Diagnosis present

## 2018-02-10 DIAGNOSIS — Y939 Activity, unspecified: Secondary | ICD-10-CM | POA: Diagnosis not present

## 2018-02-10 DIAGNOSIS — Y929 Unspecified place or not applicable: Secondary | ICD-10-CM | POA: Insufficient documentation

## 2018-02-10 MED ORDER — IBUPROFEN 600 MG PO TABS
600.0000 mg | ORAL_TABLET | Freq: Once | ORAL | Status: AC
  Administered 2018-02-10: 600 mg via ORAL
  Filled 2018-02-10: qty 1

## 2018-02-10 MED ORDER — CYCLOBENZAPRINE HCL 10 MG PO TABS: 10.0000 mg | ORAL_TABLET | Freq: Three times a day (TID) | ORAL | 0 refills | Status: AC | PRN

## 2018-02-10 MED ORDER — HYDROCODONE-ACETAMINOPHEN 5-325 MG PO TABS: 1.0000 | ORAL_TABLET | Freq: Four times a day (QID) | ORAL | 0 refills | Status: DC | PRN

## 2018-02-10 MED ORDER — OXYCODONE-ACETAMINOPHEN 5-325 MG PO TABS
1.0000 | ORAL_TABLET | Freq: Once | ORAL | Status: AC
  Administered 2018-02-10: 1 via ORAL
  Filled 2018-02-10: qty 1

## 2018-02-10 MED ORDER — CLONIDINE HCL 0.1 MG PO TABS
ORAL_TABLET | ORAL | Status: AC
  Filled 2018-02-10: qty 1

## 2018-02-10 MED ORDER — IBUPROFEN 600 MG PO TABS: 600.0000 mg | ORAL_TABLET | Freq: Four times a day (QID) | ORAL | 0 refills | Status: DC | PRN

## 2018-02-10 MED ORDER — CLONIDINE HCL 0.1 MG PO TABS
0.3000 mg | ORAL_TABLET | Freq: Once | ORAL | Status: AC
  Administered 2018-02-10: 0.3 mg via ORAL
  Filled 2018-02-10: qty 3

## 2018-02-10 NOTE — Discharge Instructions (Addendum)
Take the ibuprofen and Flexeril (the muscle relaxant) as needed for pain.  You may take the Vicodin as needed for more severe pain over the next several days.  Return to the ER for new, worsening, or persistent severe pain, any weakness, numbness, decreased grip strength, or severe tingling in the arms or legs, or any other new or worsening symptoms that concern you.

## 2018-02-10 NOTE — ED Notes (Signed)
Patient transported to CT 

## 2018-02-10 NOTE — ED Notes (Signed)
SHP at bedside at this time

## 2018-02-10 NOTE — ED Notes (Signed)
Pt states he needs his blood pressure medication, he was supposed to take at 0500 today. md notified. Pt states is clonidine. Warm blankets provided.

## 2018-02-10 NOTE — ED Notes (Signed)
Report to jordyn, rn.  

## 2018-02-10 NOTE — ED Notes (Signed)
ED Provider at bedside. 
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
## 2018-03-08 ENCOUNTER — Encounter: Payer: Self-pay | Admitting: Emergency Medicine

## 2018-03-08 ENCOUNTER — Observation Stay: Payer: BLUE CROSS/BLUE SHIELD

## 2018-03-08 ENCOUNTER — Observation Stay
Admission: EM | Admit: 2018-03-08 | Discharge: 2018-03-10 | Disposition: A | Discharge status: Home / Self Care | Payer: BLUE CROSS/BLUE SHIELD | Attending: Internal Medicine | Admitting: Internal Medicine

## 2018-03-08 ENCOUNTER — Other Ambulatory Visit: Payer: Self-pay

## 2018-03-08 DIAGNOSIS — Z885 Allergy status to narcotic agent status: Secondary | ICD-10-CM | POA: Diagnosis not present

## 2018-03-08 DIAGNOSIS — I4891 Unspecified atrial fibrillation: Secondary | ICD-10-CM | POA: Insufficient documentation

## 2018-03-08 DIAGNOSIS — K3189 Other diseases of stomach and duodenum: Secondary | ICD-10-CM | POA: Insufficient documentation

## 2018-03-08 DIAGNOSIS — R112 Nausea with vomiting, unspecified: Secondary | ICD-10-CM | POA: Diagnosis present

## 2018-03-08 DIAGNOSIS — I10 Essential (primary) hypertension: Secondary | ICD-10-CM | POA: Diagnosis not present

## 2018-03-08 DIAGNOSIS — G43A1 Cyclical vomiting, intractable: Secondary | ICD-10-CM | POA: Diagnosis not present

## 2018-03-08 DIAGNOSIS — F17210 Nicotine dependence, cigarettes, uncomplicated: Secondary | ICD-10-CM | POA: Insufficient documentation

## 2018-03-08 DIAGNOSIS — Z8249 Family history of ischemic heart disease and other diseases of the circulatory system: Secondary | ICD-10-CM | POA: Insufficient documentation

## 2018-03-08 DIAGNOSIS — K2970 Gastritis, unspecified, without bleeding: Secondary | ICD-10-CM | POA: Insufficient documentation

## 2018-03-08 DIAGNOSIS — K219 Gastro-esophageal reflux disease without esophagitis: Secondary | ICD-10-CM | POA: Insufficient documentation

## 2018-03-08 DIAGNOSIS — R1115 Cyclical vomiting syndrome unrelated to migraine: Secondary | ICD-10-CM

## 2018-03-08 DIAGNOSIS — F1210 Cannabis abuse, uncomplicated: Secondary | ICD-10-CM | POA: Insufficient documentation

## 2018-03-08 LAB — URINE DRUG SCREEN, QUALITATIVE (ARMC ONLY)
Amphetamines, Ur Screen: NOT DETECTED
BARBITURATES, UR SCREEN: NOT DETECTED
CANNABINOID 50 NG, UR ~~LOC~~: POSITIVE — AB
COCAINE METABOLITE, UR ~~LOC~~: NOT DETECTED
MDMA (ECSTASY) UR SCREEN: NOT DETECTED
Methadone Scn, Ur: NOT DETECTED
OPIATE, UR SCREEN: NOT DETECTED
Phencyclidine (PCP) Ur S: NOT DETECTED
TRICYCLIC, UR SCREEN: NOT DETECTED

## 2018-03-08 LAB — COMPREHENSIVE METABOLIC PANEL
ALT: 15 U/L (ref 0–44)
AST: 28 U/L (ref 15–41)
Albumin: 4.9 g/dL (ref 3.5–5.0)
Alkaline Phosphatase: 62 U/L (ref 38–126)
Anion gap: 8 (ref 5–15)
BUN: 8 mg/dL (ref 6–20)
CO2: 26 mmol/L (ref 22–32)
Calcium: 9.7 mg/dL (ref 8.9–10.3)
Chloride: 109 mmol/L (ref 98–111)
Creatinine, Ser: 0.92 mg/dL (ref 0.61–1.24)
GFR calc Af Amer: >60 mL/min (ref >60)
GFR calc non Af Amer: >60 mL/min (ref >60)
Glucose, Bld: 118 mg/dL — ABNORMAL HIGH (ref 70–99)
Potassium: 3.7 mmol/L (ref 3.5–5.1)
Sodium: 143 mmol/L (ref 135–145)
Total Bilirubin: 1 mg/dL (ref 0.3–1.2)
Total Protein: 8 g/dL (ref 6.5–8.1)

## 2018-03-08 LAB — URINALYSIS, COMPLETE (UACMP) WITH MICROSCOPIC
Bacteria, UA: NONE SEEN
Bilirubin Urine: NEGATIVE
Glucose, UA: NEGATIVE mg/dL
Hgb urine dipstick: NEGATIVE
Ketones, ur: 20 mg/dL — AB
Leukocytes, UA: NEGATIVE
Nitrite: NEGATIVE
Protein, ur: 30 mg/dL — AB
Specific Gravity, Urine: 1.015 (ref 1.005–1.030)
pH: 8 (ref 5.0–8.0)

## 2018-03-08 LAB — CBC
HCT: 45.8 % (ref 40.0–52.0)
Hemoglobin: 15.8 g/dL (ref 13.0–18.0)
MCH: 31.8 pg (ref 26.0–34.0)
MCHC: 34.6 g/dL (ref 32.0–36.0)
MCV: 92 fL (ref 80.0–100.0)
Platelets: 194 10*3/uL (ref 150–440)
RBC: 4.97 MIL/uL (ref 4.40–5.90)
RDW: 12.5 % (ref 11.5–14.5)
WBC: 12.8 10*3/uL — ABNORMAL HIGH (ref 3.8–10.6)

## 2018-03-08 LAB — LIPASE, BLOOD: Lipase: 21 U/L (ref 11–51)

## 2018-03-08 MED ORDER — PANTOPRAZOLE SODIUM 40 MG PO TBEC
40.0000 mg | DELAYED_RELEASE_TABLET | Freq: Two times a day (BID) | ORAL | Status: DC
  Administered 2018-03-08 – 2018-03-10 (×4): 40 mg via ORAL
  Filled 2018-03-08 (×4): qty 1

## 2018-03-08 MED ORDER — HALOPERIDOL LACTATE 5 MG/ML IJ SOLN
5.0000 mg | Freq: Once | INTRAMUSCULAR | Status: AC
  Administered 2018-03-08: 5 mg via INTRAVENOUS
  Filled 2018-03-08: qty 1

## 2018-03-08 MED ORDER — KETOROLAC TROMETHAMINE 30 MG/ML IJ SOLN
INTRAMUSCULAR | Status: AC
  Filled 2018-03-08: qty 1

## 2018-03-08 MED ORDER — PROMETHAZINE HCL 25 MG/ML IJ SOLN
25.0000 mg | Freq: Once | INTRAMUSCULAR | Status: AC
  Administered 2018-03-08: 25 mg via INTRAVENOUS
  Filled 2018-03-08: qty 1

## 2018-03-08 MED ORDER — METOCLOPRAMIDE HCL 5 MG/ML IJ SOLN
5.0000 mg | Freq: Four times a day (QID) | INTRAMUSCULAR | Status: DC | PRN
  Administered 2018-03-08 – 2018-03-09 (×3): 5 mg via INTRAVENOUS
  Filled 2018-03-08 (×3): qty 2

## 2018-03-08 MED ORDER — HYDRALAZINE HCL 20 MG/ML IJ SOLN
10.0000 mg | Freq: Four times a day (QID) | INTRAMUSCULAR | Status: DC | PRN
  Administered 2018-03-08 – 2018-03-09 (×3): 10 mg via INTRAVENOUS
  Filled 2018-03-08 (×3): qty 1

## 2018-03-08 MED ORDER — KETOROLAC TROMETHAMINE 15 MG/ML IJ SOLN
15.0000 mg | Freq: Four times a day (QID) | INTRAMUSCULAR | Status: DC | PRN
  Administered 2018-03-08 – 2018-03-09 (×4): 15 mg via INTRAVENOUS
  Filled 2018-03-08 (×4): qty 1

## 2018-03-08 MED ORDER — TRAMADOL HCL 50 MG PO TABS: 50.0000 mg | ORAL_TABLET | Freq: Four times a day (QID) | ORAL | Status: DC | PRN

## 2018-03-08 MED ORDER — NICOTINE 14 MG/24HR TD PT24
14.0000 mg | MEDICATED_PATCH | Freq: Every day | TRANSDERMAL | Status: DC
  Administered 2018-03-09 – 2018-03-10 (×2): 14 mg via TRANSDERMAL
  Filled 2018-03-08 (×2): qty 1

## 2018-03-08 MED ORDER — PROCHLORPERAZINE 25 MG RE SUPP
25.0000 mg | Freq: Two times a day (BID) | RECTAL | Status: DC | PRN
  Filled 2018-03-08: qty 1

## 2018-03-08 MED ORDER — PROMETHAZINE HCL 25 MG/ML IJ SOLN
25.0000 mg | Freq: Four times a day (QID) | INTRAMUSCULAR | Status: DC | PRN
  Administered 2018-03-08 – 2018-03-10 (×6): 25 mg via INTRAVENOUS
  Filled 2018-03-08 (×6): qty 1

## 2018-03-08 MED ORDER — SODIUM CHLORIDE 0.9 % IV SOLN
1000.0000 mL | Freq: Once | INTRAVENOUS | Status: AC
  Administered 2018-03-08: 1000 mL via INTRAVENOUS

## 2018-03-08 MED ORDER — PROCHLORPERAZINE MALEATE 10 MG PO TABS
10.0000 mg | ORAL_TABLET | Freq: Four times a day (QID) | ORAL | Status: DC | PRN
  Filled 2018-03-08: qty 1

## 2018-03-08 MED ORDER — CLONIDINE HCL 0.3 MG/24HR TD PTWK
0.3000 mg | MEDICATED_PATCH | TRANSDERMAL | Status: DC
  Administered 2018-03-08: 0.3 mg via TRANSDERMAL
  Filled 2018-03-08: qty 1

## 2018-03-08 MED ORDER — ENOXAPARIN SODIUM 40 MG/0.4ML ~~LOC~~ SOLN
40.0000 mg | SUBCUTANEOUS | Status: DC
  Administered 2018-03-08 – 2018-03-09 (×2): 40 mg via SUBCUTANEOUS
  Filled 2018-03-08 (×2): qty 0.4

## 2018-03-08 MED ORDER — SODIUM CHLORIDE 0.9 % IV SOLN
INTRAVENOUS | Status: DC
  Administered 2018-03-08 – 2018-03-10 (×3): via INTRAVENOUS

## 2018-03-08 MED ORDER — CLONIDINE HCL 0.1 MG PO TABS: 0.3000 mg | ORAL_TABLET | Freq: Two times a day (BID) | ORAL | Status: DC

## 2018-03-08 MED ORDER — HYDROMORPHONE HCL 1 MG/ML IJ SOLN
1.0000 mg | Freq: Once | INTRAMUSCULAR | Status: AC
  Administered 2018-03-08: 1 mg via INTRAVENOUS
  Filled 2018-03-08: qty 1

## 2018-03-08 NOTE — ED Notes (Addendum)
Two unsuccessful attempts at IV. Called RN float Megan to look for IV

## 2018-03-08 NOTE — Progress Notes (Signed)
Notified Dr. Nemiah CommanderKalisetti of elevated BP on arrival to floor.  Attempted to give p.m. dose of clonidine however patient began vomiting.  BP remained elevated.  Spoke with Dr. Nemiah CommanderKalisetti and will place clonidine patch and give second dose of prn hydralazine.

## 2018-03-08 NOTE — ED Provider Notes (Signed)
[facility] Emergency Department Provider Note   ____________________________________________    I have reviewed the triage vital signs and the nursing notes.   HISTORY  Chief Complaint Abdominal Pain and Emesis     HPI Brandon Allen is a 37 y.o. male who presents with complaints of nausea vomiting and upper abdominal pain.  Patient has a history of cyclic vomiting syndrome, sent to be from cannabis use.  He reports he does still smoke marijuana although he has decreased his usage.  Review of records demonstrates a history of admissions for intractable nausea and vomiting.  He states his symptoms came on relatively abruptly at 3 AM while he was at work.  He has not taken anything for them.  Nothing seems to make this better or worse.   Past Medical History:  Diagnosis Date  . Atrial fibrillation (HCC)    Dr Mariah Milling  . 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 Deafness in right ear   . HTN (hypertension) 12/20/2014  . Kidney stones   . NSAID induced gastritis     Patient Active Problem List   Diagnosis Date Noted  . Intractable nausea and vomiting 06/07/2017  . Opiate abuse, episodic (HCC) 06/07/2017  . Tobacco abuse 04/06/2017  . Nausea & vomiting 03/21/2017  . Hypertensive urgency 03/21/2017  . GERD (gastroesophageal reflux disease) 11/24/2016  . Leukocytosis 01/26/2016  . Hyperglycemia 01/26/2016  . Essential hypertension 01/26/2016  . Cannabis abuse 01/26/2016  . Cyclic vomiting syndrome 01/25/2016  . Atrial fibrillation with rapid ventricular response (HCC) 12/20/2014  . HTN (hypertension) 12/20/2014    Past Surgical History:  Procedure Laterality Date  . CHOLECYSTECTOMY    . ESOPHAGOGASTRODUODENOSCOPY    . ESOPHAGOGASTRODUODENOSCOPY  11/2011   NSAID-induced gastritis (Dr Bluford Kaufmann)  . ESOPHAGOGASTRODUODENOSCOPY N/A 06/24/2016   Procedure: ESOPHAGOGASTRODUODENOSCOPY (EGD);  Surgeon: Scot Jun, MD;  Location: [facility]  ENDOSCOPY;  Service: Endoscopy;  Laterality: N/A;  . INNER EAR SURGERY      Prior to Admission medications   Medication Sig Start Date End Date Taking? Authorizing Provider  cloNIDine (CATAPRES) 0.3 MG tablet Take 1 tablet by mouth 2 (two) times daily. 01/27/18  Yes [provider]  HYDROcodone-acetaminophen (NORCO/VICODIN) 5-325 MG tablet Take 1 tablet by mouth every 6 (six) hours as needed for severe pain. Patient not taking: Reported on 03/08/2018 02/10/18   Dionne Bucy, MD  ibuprofen (ADVIL,MOTRIN) 600 MG tablet Take 1 tablet (600 mg total) by mouth every 6 (six) hours as needed. 02/10/18   Dionne Bucy, MD  pantoprazole (PROTONIX) 40 MG tablet Take 1 tablet (40 mg total) 2 (two) times daily by mouth. Patient not taking: Reported on 03/08/2018 06/09/17   Ramonita Lab, MD  promethazine (PHENERGAN) 12.5 MG tablet Take 1 tablet (12.5 mg total) every 6 (six) hours as needed by mouth for nausea or vomiting. Patient not taking: Reported on 03/08/2018 06/09/17   Ramonita Lab, MD  traMADol (ULTRAM) 50 MG tablet Take 1 tablet (50 mg total) 3 (three) times daily as needed by mouth. Patient not taking: Reported on 03/08/2018 06/09/17   Ramonita Lab, MD     Allergies Morphine and related and Zofran [ondansetron hcl]  Family History  Problem Relation Age of Onset  . Heart failure Other   . Colon cancer Maternal Grandfather 36    Social History Social History   Tobacco Use  . Smoking status: Current Every Day Smoker    Packs/day: 0.50    Types:  Cigarettes    Start date: 12/19/2009  . Smokeless tobacco: Never Used  Substance Use Topics  . Alcohol use: No    Alcohol/week: 0.0 oz  . Drug use: Yes    Types: Marijuana    Review of Systems  Constitutional: No fever/chills Eyes: No visual changes.  ENT: No sore throat. Cardiovascular: Denies chest pain. Respiratory: Denies shortness of breath. Gastrointestinal: As above Genitourinary: Negative for dysuria. Musculoskeletal:  Negative for back pain. Skin: Negative for rash. Neurological: Negative for headaches or weakness   ____________________________________________   PHYSICAL EXAM:  VITAL SIGNS: ED Triage Vitals  Enc Vitals Group     BP 03/08/18 1053 (!) 175/133     Pulse Rate 03/08/18 1053 70     Resp 03/08/18 1053 18     Temp 03/08/18 1053 98.1 F (36.7 C)     Temp Source 03/08/18 1053 Oral     SpO2 03/08/18 1053 99 %     Weight 03/08/18 1054 88.5 kg (195 lb)     Height 03/08/18 1054 1.829 m (6')     Head Circumference --      Peak Flow --      Pain Score 03/08/18 1053 9     Pain Loc --      Pain Edu? --      Excl. in GC? --     Constitutional: Alert and oriented. No acute distress.  Eyes: Conjunctivae are normal.   Nose: No congestion/rhinnorhea. Mouth/Throat: Mucous membranes are moist.    Cardiovascular: Normal rate, regular rhythm. Grossly normal heart sounds.  Good peripheral circulation. Respiratory: Normal respiratory effort.  No retractions.  Gastrointestinal: Soft and nontender. No distention.    Musculoskeletal: No lower extremity tenderness nor edema.  Warm and well perfused Neurologic:  Normal speech and language. No gross focal neurologic deficits are appreciated.  Skin:  Skin is warm, dry and intact. No rash noted. Psychiatric: Mood and affect are normal. Speech and behavior are normal.  ____________________________________________   LABS (all labs ordered are listed, but only abnormal results are displayed)  Labs Reviewed  COMPREHENSIVE METABOLIC PANEL - Abnormal; Notable for the following components:      Result Value   Glucose, Bld 118 (*)    All other components within normal limits  CBC - Abnormal; Notable for the following components:   WBC 12.8 (*)    All other components within normal limits  URINALYSIS, COMPLETE (UACMP) WITH MICROSCOPIC - Abnormal; Notable for the following components:   Color, Urine YELLOW (*)    APPearance CLEAR (*)    Ketones, ur  20 (*)    Protein, ur 30 (*)    All other components within normal limits  LIPASE, BLOOD   ____________________________________________  EKG  None ____________________________________________  RADIOLOGY  None ____________________________________________   PROCEDURES  Procedure(s) performed: No  Procedures   Critical Care performed: No ____________________________________________   INITIAL IMPRESSION / ASSESSMENT AND PLAN / ED COURSE  Pertinent labs & imaging results that were available during my care of the patient were reviewed by me and considered in my medical decision making (see chart for details).  Patient presents with nausea vomiting and upper abdominal pain, similar to prior episodes of cyclic vomiting.  We will treat with IV Dilaudid, IV Phenergan and reevaluate after IV fluids  ----------------------------------------- 1:07 PM on 03/08/2018 -----------------------------------------   Patient with continued vomiting, will try IV Haldol to see if this helps his symptoms.  Neither nursing nor lab has been able to obtain  blood  ----------------------------------------- 2:55 PM on 03/08/2018 -----------------------------------------   Nursing staff was able to obtain blood, unremarkable results.  Patient had significant improvement after IV Haldol but now is starting to vomit again, have discussed with hospitalist for admission    ____________________________________________   FINAL CLINICAL IMPRESSION(S) / ED DIAGNOSES  Final diagnoses:  Intractable cyclical vomiting with nausea        Note:  This document was prepared using Dragon voice recognition software and may include unintentional dictation errors.    Jene Every, MD 03/08/18 1455

## 2018-03-08 NOTE — ED Triage Notes (Signed)
Pt reports vomiting that started about 3am this morning. Pt c/o pain to his upper abdomen that is sharp and stabbing in nature. Pt pale and diaphoretic in room. Pt actively vomiting in triage.

## 2018-03-08 NOTE — ED Notes (Signed)
AAO3.  Skin warm and dry. NAD

## 2018-03-08 NOTE — H&P (Addendum)
[facility] [place] at [facility]   PATIENT NAME: Brandon Allen    MR#:  [id number]  DATE OF BIRTH:  1980-11-09  DATE OF ADMISSION:  03/08/2018  PRIMARY CARE PHYSICIAN: Patient, No Pcp Per   REQUESTING/REFERRING PHYSICIAN: Dr. Jene Every  CHIEF COMPLAINT:   Chief Complaint  Patient presents with  . Abdominal Pain  . Emesis    HISTORY OF PRESENT ILLNESS:  Brandon Allen  is a 37 y.o. male with a known history of hypertension, NSAID induced gastritis, cyclical vomiting syndrome from marijuana use presents to hospital secondary to nausea and vomiting. Patient states he was fine up until early this morning when he woke up with intractable nausea and started having vomiting.  Clear green vomitus.  No blood in it.  Denies any diarrhea.  No fevers or chills.  Did not eat anything unusual.  No recent travel. -Also complains of diffuse abdominal pain and tender all over.  He does use marijuana every other day at least.  PAST MEDICAL HISTORY:   Past Medical History:  Diagnosis Date  . Atrial fibrillation (HCC)    Dr Mariah Milling  . 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 Deafness in right ear   . HTN (hypertension) 12/20/2014  . Kidney stones   . NSAID induced gastritis     PAST SURGICAL HISTORY:   Past Surgical History:  Procedure Laterality Date  . CHOLECYSTECTOMY    . ESOPHAGOGASTRODUODENOSCOPY    . ESOPHAGOGASTRODUODENOSCOPY  11/2011   NSAID-induced gastritis (Dr Bluford Kaufmann)  . ESOPHAGOGASTRODUODENOSCOPY N/A 06/24/2016   Procedure: ESOPHAGOGASTRODUODENOSCOPY (EGD);  Surgeon: Scot Jun, MD;  Location: [facility] ENDOSCOPY;  Service: Endoscopy;  Laterality: N/A;  . INNER EAR SURGERY      SOCIAL HISTORY:   Social History   Tobacco Use  . Smoking status: Current Every Day Smoker    Packs/day: 0.50    Types: Cigarettes    Start date: 12/19/2009  . Smokeless tobacco: Never Used  Substance Use Topics  . Alcohol use: No    Alcohol/week: 0.0 oz     FAMILY HISTORY:   Family History  Problem Relation Age of Onset  . Heart failure Other   . Colon cancer Maternal Grandfather 50  . Hypertension Paternal Grandfather     DRUG ALLERGIES:   Allergies  Allergen Reactions  . Morphine And Related Nausea And Vomiting  . Zofran [Ondansetron Hcl] Nausea And Vomiting    REVIEW OF SYSTEMS:   Review of Systems  Constitutional: Negative for chills, fever, malaise/fatigue and weight loss.  HENT: Negative for ear discharge, ear pain, hearing loss, nosebleeds and tinnitus.   Eyes: Negative for blurred vision, double vision and photophobia.  Respiratory: Negative for cough, hemoptysis, shortness of breath and wheezing.   Cardiovascular: Negative for chest pain, palpitations, orthopnea and leg swelling.  Gastrointestinal: Positive for abdominal pain, nausea and vomiting. Negative for constipation, diarrhea, heartburn and melena.  Genitourinary: Negative for dysuria, frequency, hematuria and urgency.  Musculoskeletal: Negative for back pain, myalgias and neck pain.  Skin: Negative for rash.  Neurological: Negative for dizziness, tingling, tremors, sensory change, speech change, focal weakness and headaches.  Endo/Heme/Allergies: Does not bruise/bleed easily.  Psychiatric/Behavioral: Negative for depression.    MEDICATIONS AT HOME:   Prior to Admission medications   Medication Sig Start Date End Date Taking? Authorizing Provider  cloNIDine (CATAPRES) 0.3 MG tablet Take 1 tablet by mouth 2 (two) times daily. 01/27/18  Yes [provider]  HYDROcodone-acetaminophen (  NORCO/VICODIN) 5-325 MG tablet Take 1 tablet by mouth every 6 (six) hours as needed for severe pain. Patient not taking: Reported on 03/08/2018 02/10/18   Dionne BucySiadecki, Sebastian, MD  ibuprofen (ADVIL,MOTRIN) 600 MG tablet Take 1 tablet (600 mg total) by mouth every 6 (six) hours as needed. 02/10/18   Dionne BucySiadecki, Sebastian, MD  pantoprazole (PROTONIX) 40 MG tablet Take 1 tablet  (40 mg total) 2 (two) times daily by mouth. Patient not taking: Reported on 03/08/2018 06/09/17   Ramonita LabGouru, Aruna, MD  promethazine (PHENERGAN) 12.5 MG tablet Take 1 tablet (12.5 mg total) every 6 (six) hours as needed by mouth for nausea or vomiting. Patient not taking: Reported on 03/08/2018 06/09/17   Ramonita LabGouru, Aruna, MD  traMADol (ULTRAM) 50 MG tablet Take 1 tablet (50 mg total) 3 (three) times daily as needed by mouth. Patient not taking: Reported on 03/08/2018 06/09/17   Ramonita LabGouru, Aruna, MD      VITAL SIGNS:  Blood pressure (!) 184/117, pulse 76, temperature 98.1 F (36.7 C), temperature source Oral, resp. rate 19, height 6' (1.829 m), weight 88.5 kg (195 lb), SpO2 100 %.  PHYSICAL EXAMINATION:   Physical Exam  GENERAL:  37 y.o.-year-old patient lying in the bed with no acute distress.  EYES: Pupils equal, round, reactive to light and accommodation. No scleral icterus. Extraocular muscles intact.  HEENT: Head atraumatic, normocephalic. Oropharynx and nasopharynx clear.  NECK:  Supple, no jugular venous distention. No thyroid enlargement, no tenderness.  LUNGS: Normal breath sounds bilaterally, no wheezing, rales,rhonchi or crepitation. No use of accessory muscles of respiration.  CARDIOVASCULAR: S1, S2 normal. No murmurs, rubs, or gallops.  ABDOMEN: Soft, diffuse tenderness all over, with voluntary guarding, no rigidity or rebound tenderness, nondistended. Bowel sounds present. No organomegaly or mass.  EXTREMITIES: No pedal edema, cyanosis, or clubbing.  NEUROLOGIC: Cranial nerves II through XII are intact. Muscle strength 5/5 in all extremities. Sensation intact. Gait not checked.  PSYCHIATRIC: The patient is alert and oriented x 3.  SKIN: No obvious rash, lesion, or ulcer.   LABORATORY PANEL:   CBC Recent Labs  Lab 03/08/18 1337  WBC 12.8*  HGB 15.8  HCT 45.8  PLT 194    ------------------------------------------------------------------------------------------------------------------  Chemistries  Recent Labs  Lab 03/08/18 1337  NA 143  K 3.7  CL 109  CO2 26  GLUCOSE 118*  BUN 8  CREATININE 0.92  CALCIUM 9.7  AST 28  ALT 15  ALKPHOS 62  BILITOT 1.0   ------------------------------------------------------------------------------------------------------------------  Cardiac Enzymes No results for input(s): TROPONINI in the last 168 hours. ------------------------------------------------------------------------------------------------------------------  RADIOLOGY:  No results found.  EKG:   Orders placed or performed during the hospital encounter of 06/07/17  . ED EKG  . ED EKG  . EKG 12-Lead  . EKG 12-Lead  . EKG    IMPRESSION AND PLAN:   Lelon HuhKevin Albor  is a 37 y.o. male with a known history of hypertension, NSAID induced gastritis, cyclical vomiting syndrome from marijuana use presents to hospital secondary to nausea and vomiting.  1.  Intractable nausea and vomiting-patient has history of cyclical nausea vomiting syndrome from marijuana use. -We will get a KUB to rule out obstruction -Supportive treatment with fluids, nausea medicine pain meds.  We will try to avoid narcotics. -Check urine drug screen  2.  GERD-continue Protonix  3.  Hypertension-patient on clonidine at home.  Continue that.  IV hydralazine PRN  4.  DVT prophylaxis-Lovenox  5.  Tobacco use disorder- will add nicotine patch  All the records are reviewed and case discussed with ED provider. Management plans discussed with the patient, family and they are in agreement.  CODE STATUS: Full code  TOTAL TIME TAKING CARE OF THIS PATIENT: 50 minutes.    Tahj Lindseth M.D on 03/08/2018 at 3:32 PM  Between 7am to 6pm - Pager - [phone]  After 6pm go to [email or web address] - password [email or web address]  [facility]  Office   [phone]  CC: Primary care physician; Patient, No Pcp Per

## 2018-03-08 NOTE — ED Notes (Signed)
Lab contacted for blood draw.

## 2018-03-08 NOTE — ED Notes (Signed)
This RN attempted x1 to draw blood  With no success

## 2018-03-09 LAB — BASIC METABOLIC PANEL
ANION GAP: 10 (ref 5–15)
BUN: 10 mg/dL (ref 6–20)
CALCIUM: 9.8 mg/dL (ref 8.9–10.3)
CO2: 25 mmol/L (ref 22–32)
CREATININE: 0.92 mg/dL (ref 0.61–1.24)
Chloride: 109 mmol/L (ref 98–111)
Glucose, Bld: 116 mg/dL — ABNORMAL HIGH (ref 70–99)
Potassium: 3.3 mmol/L — ABNORMAL LOW (ref 3.5–5.1)
Sodium: 144 mmol/L (ref 135–145)

## 2018-03-09 LAB — CBC
HCT: 45.2 % (ref 40.0–52.0)
HEMOGLOBIN: 15.6 g/dL (ref 13.0–18.0)
MCH: 32.1 pg (ref 26.0–34.0)
MCHC: 34.6 g/dL (ref 32.0–36.0)
MCV: 92.8 fL (ref 80.0–100.0)
PLATELETS: 206 10*3/uL (ref 150–440)
RBC: 4.87 MIL/uL (ref 4.40–5.90)
RDW: 12.5 % (ref 11.5–14.5)
WBC: 16.1 10*3/uL — ABNORMAL HIGH (ref 3.8–10.6)

## 2018-03-09 MED ORDER — METOPROLOL TARTRATE 25 MG PO TABS
25.0000 mg | ORAL_TABLET | Freq: Two times a day (BID) | ORAL | Status: DC
  Administered 2018-03-09 – 2018-03-10 (×3): 25 mg via ORAL
  Filled 2018-03-09 (×3): qty 1

## 2018-03-09 MED ORDER — ACETAMINOPHEN 325 MG PO TABS: 650.0000 mg | ORAL_TABLET | Freq: Four times a day (QID) | ORAL | Status: DC | PRN

## 2018-03-09 MED ORDER — METOPROLOL TARTRATE 5 MG/5ML IV SOLN
5.0000 mg | Freq: Four times a day (QID) | INTRAVENOUS | Status: DC | PRN
  Administered 2018-03-09: 5 mg via INTRAVENOUS
  Filled 2018-03-09: qty 5

## 2018-03-09 MED ORDER — ENALAPRILAT 1.25 MG/ML IV SOLN
1.2500 mg | Freq: Once | INTRAVENOUS | Status: AC
  Administered 2018-03-09: 1.25 mg via INTRAVENOUS
  Filled 2018-03-09: qty 1

## 2018-03-09 MED ORDER — POTASSIUM CHLORIDE CRYS ER 20 MEQ PO TBCR
40.0000 meq | EXTENDED_RELEASE_TABLET | Freq: Once | ORAL | Status: AC
  Administered 2018-03-09: 40 meq via ORAL
  Filled 2018-03-09: qty 2

## 2018-03-09 MED ORDER — METOPROLOL TARTRATE 5 MG/5ML IV SOLN: 5.0000 mg | Freq: Once | INTRAVENOUS | Status: DC

## 2018-03-09 NOTE — Progress Notes (Addendum)
MD Marjie SkiffSridharan made aware of pt's BP of 179/108, 175/114, will give pt Vasotec 1.25 mg once as ordered by MD. Will continue to monitor.  0530 MD made aware of WBC of 16.1, IVF will be increased to 125 ml/hr.  [id number] update Made MD aware via text page that pt's BP is 180/110 after Vasotec administration.

## 2018-03-09 NOTE — Progress Notes (Signed)
Notified MD pt BP is 180/118. PRN hydralazine has been given. Per MD okay for fluids rate to stay at 125 for now. Will continue to monitor pt.

## 2018-03-09 NOTE — Progress Notes (Signed)
[facility] [place] at [facility]   PATIENT NAME: Brandon Allen    MR#:  [id number]  DATE OF BIRTH:  08/07/1980  SUBJECTIVE:  CHIEF COMPLAINT: Patient is still nauseous and very weak.  Denies any vomiting.  Admits marijuana.  REVIEW OF SYSTEMS:  CONSTITUTIONAL: No fever, fatigue or weakness.  EYES: No blurred or double vision.  EARS, NOSE, AND THROAT: No tinnitus or ear pain.  RESPIRATORY: No cough, shortness of breath, wheezing or hemoptysis.  CARDIOVASCULAR: No chest pain, orthopnea, edema.  GASTROINTESTINAL: Has nausea, denies vomiting, diarrhea or abdominal pain.  GENITOURINARY: No dysuria, hematuria.  ENDOCRINE: No polyuria, nocturia,  HEMATOLOGY: No anemia, easy bruising or bleeding SKIN: No rash or lesion. MUSCULOSKELETAL: No joint pain or arthritis.   NEUROLOGIC: No tingling, numbness, weakness.  PSYCHIATRY: No anxiety or depression.   DRUG ALLERGIES:   Allergies  Allergen Reactions  . Morphine And Related Nausea And Vomiting  . Zofran [Ondansetron Hcl] Nausea And Vomiting    VITALS:  Blood pressure (!) 143/99, pulse 84, temperature 99.1 F (37.3 C), temperature source Oral, resp. rate 16, height 6' (1.829 m), weight 88.5 kg (195 lb), SpO2 99 %.  PHYSICAL EXAMINATION:  GENERAL:  37 y.o.-year-old patient lying in the bed with no acute distress.  EYES: Pupils equal, round, reactive to light and accommodation. No scleral icterus. Extraocular muscles intact.  HEENT: Head atraumatic, normocephalic. Oropharynx and nasopharynx clear.  NECK:  Supple, no jugular venous distention. No thyroid enlargement, no tenderness.  LUNGS: Normal breath sounds bilaterally, no wheezing, rales,rhonchi or crepitation. No use of accessory muscles of respiration.  CARDIOVASCULAR: S1, S2 normal. No murmurs, rubs, or gallops.  ABDOMEN: Soft, nontender, nondistended. Bowel sounds present. No organomegaly or mass.  EXTREMITIES: No pedal edema, cyanosis, or clubbing.   NEUROLOGIC: Cranial nerves II through XII are intact.  Sensation intact. Gait not checked.  PSYCHIATRIC: The patient is alert and oriented x 3.  SKIN: No obvious rash, lesion, or ulcer.    LABORATORY PANEL:   CBC Recent Labs  Lab 03/09/18 0353  WBC 16.1*  HGB 15.6  HCT 45.2  PLT 206   ------------------------------------------------------------------------------------------------------------------  Chemistries  Recent Labs  Lab 03/08/18 1337 03/09/18 0353  NA 143 144  K 3.7 3.3*  CL 109 109  CO2 26 25  GLUCOSE 118* 116*  BUN 8 10  CREATININE 0.92 0.92  CALCIUM 9.7 9.8  AST 28  --   ALT 15  --   ALKPHOS 62  --   BILITOT 1.0  --    ------------------------------------------------------------------------------------------------------------------  Cardiac Enzymes No results for input(s): TROPONINI in the last 168 hours. ------------------------------------------------------------------------------------------------------------------  RADIOLOGY:  Dg Abd 1 View  Result Date: 03/08/2018 CLINICAL DATA:  Brandon Allen is a 37 y.o. male with a known history of hypertension, NSAID induced gastritis, cyclical vomiting syndrome from marijuana use presents to hospital secondary to nausea and vomiting. EXAM: ABDOMEN - 1 VIEW COMPARISON:  09/28/2016 FINDINGS: The bowel gas pattern is normal. No radio-opaque calculi or other significant radiographic abnormality are seen. IMPRESSION: Negative. Electronically Signed   By: Norva PavlovElizabeth  Brown M.D.   On: 03/08/2018 15:58    EKG:   Orders placed or performed during the hospital encounter of 06/07/17  . ED EKG  . ED EKG  . EKG 12-Lead  . EKG 12-Lead  . EKG    ASSESSMENT AND PLAN:     Brandon Allen  is a 37 y.o. male with a known history of hypertension, NSAID induced gastritis,  cyclical vomiting syndrome from marijuana use presents to hospital secondary to nausea and vomiting.  1.  Intractable nausea and vomiting-patient has  history of cyclical nausea vomiting syndrome from marijuana use. -Clinically feeling better  -KUB negative -Supportive treatment with fluids, nausea medicine pain meds.  We will try to avoid narcotics. -urine drug screen-positive for opiates and cannabinoids  2.  GERD-continue Protonix  3.  Hypertension-patient on clonidine at home.  Clonidine patch as blood pressure is not well controlled and metoprolol is added to the regimen IV hydralazine as needed  4.  DVT prophylaxis-Lovenox  5.  Tobacco use disorder- will add nicotine patch   Disposition 1 to 2 days and work excuse note will be given at the time of discharge   All the records are reviewed and case discussed with Care Management/Social Workerr. Management plans discussed with the patient, family and they are in agreement.  CODE STATUS:  fc   TOTAL TIME TAKING CARE OF THIS PATIENT: 36  minutes.   POSSIBLE D/C IN 1 DAYS, DEPENDING ON CLINICAL CONDITION.  Note: This dictation was prepared with Dragon dictation along with smaller phrase technology. Any transcriptional errors that result from this process are unintentional.   Ramonita Lab M.D on 03/09/2018 at 3:06 PM  Between 7am to 6pm - Pager - [phone] After 6pm go to [email or web address] - password [email or web address]  [facility]  Office  [phone]  CC: Primary care physician; Patient, No Pcp Per

## 2018-03-09 NOTE — Progress Notes (Signed)
Spoke to MD Caryn BeeMaier about pt's BP being 193/100, made MD aware that Hydralazine does not seem to be helping. Obtained a verbal order for Lopressor Every 6 hours PRN for Systolic BP >170.

## 2018-03-09 NOTE — Progress Notes (Signed)
Per MD okay for RN to change diet to clear liquids and decrease iv fluids rate.

## 2018-03-09 NOTE — Progress Notes (Signed)
Pharmacy Electrolyte Monitoring Consult:  Pharmacy consulted to assist in monitoring and replacing electrolytes in this[age] year old male admitted for nausea and vomiting.   Labs: 8/7 K 3.3  Assessment/Plan: Due to episodes of vomiting, will replace potassium 40mEq PO x 1 dose today.  Will recheck electrolytes with am labs.  Pharmacy will continue to monitor and adjust per consult.  Pricilla RiffleAbby K Ellington, PharmD Pharmacy Resident  03/09/2018 2:57 PM

## 2018-03-10 LAB — BASIC METABOLIC PANEL
Anion gap: 5 (ref 5–15)
BUN: 15 mg/dL (ref 6–20)
CO2: 25 mmol/L (ref 22–32)
Calcium: 8.8 mg/dL — ABNORMAL LOW (ref 8.9–10.3)
Chloride: 112 mmol/L — ABNORMAL HIGH (ref 98–111)
Creatinine, Ser: 1.04 mg/dL (ref 0.61–1.24)
GFR calc Af Amer: >60 mL/min (ref >60)
GFR calc non Af Amer: >60 mL/min (ref >60)
GLUCOSE: 102 mg/dL — AB (ref 70–99)
POTASSIUM: 3.8 mmol/L (ref 3.5–5.1)
Sodium: 142 mmol/L (ref 135–145)

## 2018-03-10 LAB — MAGNESIUM: Magnesium: 2.1 mg/dL (ref 1.7–2.4)

## 2018-03-10 MED ORDER — METOPROLOL TARTRATE 25 MG PO TABS: 12.5000 mg | ORAL_TABLET | Freq: Two times a day (BID) | ORAL | Status: DC

## 2018-03-10 MED ORDER — PROMETHAZINE HCL 12.5 MG PO TABS: 12.5000 mg | ORAL_TABLET | Freq: Three times a day (TID) | ORAL | 0 refills | Status: DC | PRN

## 2018-03-10 MED ORDER — ACETAMINOPHEN 325 MG PO TABS: 650.0000 mg | ORAL_TABLET | Freq: Four times a day (QID) | ORAL | Status: AC | PRN

## 2018-03-10 MED ORDER — NICOTINE 14 MG/24HR TD PT24: 14.0000 mg | MEDICATED_PATCH | Freq: Every day | TRANSDERMAL | 0 refills | Status: DC

## 2018-03-10 MED ORDER — PROMETHAZINE HCL 25 MG PO TABS: 12.5000 mg | ORAL_TABLET | Freq: Three times a day (TID) | ORAL | Status: DC | PRN

## 2018-03-10 MED ORDER — CLONIDINE 0.3 MG/24HR TD PTWK: 0.3000 mg | MEDICATED_PATCH | TRANSDERMAL | 0 refills | Status: DC

## 2018-03-10 MED ORDER — METOPROLOL TARTRATE 25 MG PO TABS: 12.5000 mg | ORAL_TABLET | Freq: Two times a day (BID) | ORAL | 0 refills | Status: DC

## 2018-03-10 NOTE — Progress Notes (Signed)
Per MD okay for RN to advance diet order for soft diet.

## 2018-03-10 NOTE — Progress Notes (Addendum)
Pharmacy Electrolyte Monitoring Consult:  Pharmacy consulted to assist in monitoring and replacing electrolytes in this[age] year old male admitted for nausea and vomiting.   Labs: 8/7 K 3.3 (40 mEq PO x 1 dose) 8/8 K 3.8  Assessment/Plan: Potassium WNL today after 40 mEq x 1 dose yesterday. No supplementation required today.  Will recheck electrolytes with am labs.  Pharmacy will continue to monitor and adjust per consult.  Pricilla RiffleAbby K Ellington, PharmD Pharmacy Resident  03/10/2018 7:58 AM

## 2018-03-10 NOTE — Discharge Summary (Signed)
[facility] [place] at [facility]   PATIENT NAME: Brandon Allen    MR#:  [id number]  DATE OF BIRTH:  [birth year]  DATE OF ADMISSION:  03/08/2018 ADMITTING PHYSICIAN: Enid Baas, MD  DATE OF DISCHARGE: No discharge date for patient encounter.  PRIMARY CARE PHYSICIAN: Patient, No Pcp Per    ADMISSION DIAGNOSIS:  Nausea & vomiting [R11.2] Intractable cyclical vomiting with nausea [G43.A1]  DISCHARGE DIAGNOSIS:   Intractable nausea and vomiting secondary to marijuana SECONDARY DIAGNOSIS:   Past Medical History:  Diagnosis Date  . Atrial fibrillation (HCC)    Dr Mariah Milling  . 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 Deafness in right ear   . HTN (hypertension) 12/20/2014  . Kidney stones   . NSAID induced gastritis     HOSPITAL COURSE:     1. Intractable nausea and vomiting-patient has history of cyclical nausea vomiting syndrome from marijuana use. -Clinically feeling better.  Discharge patient home -KUB negative -Supportive treatment with fluids, nausea medicine pain meds.  -urine drug screen-positive for opiates and cannabinoids.  Strictly instructed to stop using illicit drugs including marijuana.  Patient verbalized understanding -Discharge home with Phenergan to take as needed  2. GERD-continue Protonix  3. Hypertension-patient on clonidine at home.  Will change clonidine tablets to clonidine patch as blood pressure is  well controlled and metoprolol 12.5 mg is added to the regimen.  Patient was instructed to stop taking clonidine tablets which was his previous home medication IV hydralazine as needed during the hospital course  4. DVT prophylaxis-Lovenox  5. Tobacco use disorder- will add nicotine patch   DISCHARGE CONDITIONS:  STABLE  CONSULTS OBTAINED:     PROCEDURES  NONE  DRUG ALLERGIES:   Allergies  Allergen Reactions  . Morphine And Related Nausea And Vomiting  . Zofran [Ondansetron Hcl]  Nausea And Vomiting    DISCHARGE MEDICATIONS:   Allergies as of 03/10/2018      Reactions   Morphine And Related Nausea And Vomiting   Zofran [ondansetron Hcl] Nausea And Vomiting      Medication List    STOP taking these medications   cloNIDine 0.3 MG tablet Commonly known as:  CATAPRES   HYDROcodone-acetaminophen 5-325 MG tablet Commonly known as:  NORCO/VICODIN   traMADol 50 MG tablet Commonly known as:  ULTRAM     TAKE these medications   acetaminophen 325 MG tablet Commonly known as:  TYLENOL Take 2 tablets (650 mg total) by mouth every 6 (six) hours as needed for mild pain.   cloNIDine 0.3 mg/24hr patch Commonly known as:  CATAPRES - Dosed in mg/24 hr Place 1 patch (0.3 mg total) onto the skin once a week. Start taking on:  03/15/2018   ibuprofen 600 MG tablet Commonly known as:  ADVIL,MOTRIN Take 1 tablet (600 mg total) by mouth every 6 (six) hours as needed.   metoprolol tartrate 25 MG tablet Commonly known as:  LOPRESSOR Take 0.5 tablets (12.5 mg total) by mouth 2 (two) times daily.   nicotine 14 mg/24hr patch Commonly known as:  NICODERM CQ - dosed in mg/24 hours Place 1 patch (14 mg total) onto the skin daily. Start taking on:  03/11/2018   pantoprazole 40 MG tablet Commonly known as:  PROTONIX Take 1 tablet (40 mg total) 2 (two) times daily by mouth.   promethazine 12.5 MG tablet Commonly known as:  PHENERGAN Take 1 tablet (12.5 mg total) by mouth every 8 (eight) hours as needed  for nausea or vomiting. What changed:  when to take this        DISCHARGE INSTRUCTIONS:   Follow-up with primary care physician in 4 to 5 days DIET:  Low salt  DISCHARGE CONDITION:  Stable  ACTIVITY:  Activity as tolerated  OXYGEN:  Home Oxygen: No.   Oxygen Delivery: room air  DISCHARGE LOCATION:  home   If you experience worsening of your admission symptoms, develop shortness of breath, life threatening emergency, suicidal or homicidal thoughts you must  seek medical attention immediately by calling 911 or calling your MD immediately  if symptoms less severe.  You Must read complete instructions/literature along with all the possible adverse reactions/side effects for all the Medicines you take and that have been prescribed to you. Take any new Medicines after you have completely understood and accpet all the possible adverse reactions/side effects.   Please note  You were cared for by a hospitalist during your hospital stay. If you have any questions about your discharge medications or the care you received while you were in the hospital after you are discharged, you can call the unit and asked to speak with the hospitalist on call if the hospitalist that took care of you is not available. Once you are discharged, your primary care physician will handle any further medical issues. Please note that NO REFILLS for any discharge medications will be authorized once you are discharged, as it is imperative that you return to your primary care physician (or establish a relationship with a primary care physician if you do not have one) for your aftercare needs so that they can reassess your need for medications and monitor your lab values.     Today  Chief Complaint  Patient presents with  . Abdominal Pain  . Emesis    Patient's nausea and vomiting resolved.  Denies any abdominal pain.  Tolerating diet and wants to go home.  Blood pressure is also well controlled.  Denies any headache ROS:  CONSTITUTIONAL: Denies fevers, chills. Denies any fatigue, weakness.  EYES: Denies blurry vision, double vision, eye pain. EARS, NOSE, THROAT: Denies tinnitus, ear pain, hearing loss. RESPIRATORY: Denies cough, wheeze, shortness of breath.  CARDIOVASCULAR: Denies chest pain, palpitations, edema.  GASTROINTESTINAL: Denies nausea, vomiting, diarrhea, abdominal pain. Denies bright red blood per rectum. GENITOURINARY: Denies dysuria, hematuria. ENDOCRINE: Denies  nocturia or thyroid problems. HEMATOLOGIC AND LYMPHATIC: Denies easy bruising or bleeding. SKIN: Denies rash or lesion. MUSCULOSKELETAL: Denies pain in neck, back, shoulder, knees, hips or arthritic symptoms.  NEUROLOGIC: Denies paralysis, paresthesias.  PSYCHIATRIC: Denies anxiety or depressive symptoms.   VITAL SIGNS:  Blood pressure 121/73, pulse 62, temperature 98.1 F (36.7 C), temperature source Oral, resp. rate 16, height 6' (1.829 m), weight 88.5 kg, SpO2 100 %.  I/O:    Intake/Output Summary (Last 24 hours) at 03/10/2018 1241 Last data filed at 03/10/2018 1204 Gross per 24 hour  Intake 1955.75 ml  Output 1500 ml  Net 455.75 ml    PHYSICAL EXAMINATION:  GENERAL:  37 y.o.-year-old patient lying in the bed with no acute distress.  EYES: Pupils equal, round, reactive to light and accommodation. No scleral icterus. Extraocular muscles intact.  HEENT: Head atraumatic, normocephalic. Oropharynx and nasopharynx clear.  NECK:  Supple, no jugular venous distention. No thyroid enlargement, no tenderness.  LUNGS: Normal breath sounds bilaterally, no wheezing, rales,rhonchi or crepitation. No use of accessory muscles of respiration.  CARDIOVASCULAR: S1, S2 normal. No murmurs, rubs, or gallops.  ABDOMEN: Soft, non-tender, non-distended.  Bowel sounds present.  EXTREMITIES: No pedal edema, cyanosis, or clubbing.  NEUROLOGIC: Cranial nerves II through XII are intact. Muscle strength 5/5 in all extremities. Sensation intact. Gait not checked.  PSYCHIATRIC: The patient is alert and oriented x 3.  SKIN: No obvious rash, lesion, or ulcer.   DATA REVIEW:   CBC Recent Labs  Lab 03/09/18 0353  WBC 16.1*  HGB 15.6  HCT 45.2  PLT 206    Chemistries  Recent Labs  Lab 03/08/18 1337  03/10/18 0405  NA 143   < > 142  K 3.7   < > 3.8  CL 109   < > 112*  CO2 26   < > 25  GLUCOSE 118*   < > 102*  BUN 8   < > 15  CREATININE 0.92   < > 1.04  CALCIUM 9.7   < > 8.8*  MG  --   --  2.1   AST 28  --   --   ALT 15  --   --   ALKPHOS 62  --   --   BILITOT 1.0  --   --    < > = values in this interval not displayed.    Cardiac Enzymes No results for input(s): TROPONINI in the last 168 hours.  Microbiology Results  No results found for this or any previous visit.  RADIOLOGY:  Dg Abd 1 View  Result Date: 03/08/2018 CLINICAL DATA:  Brandon Allen is a 37 y.o. male with a known history of hypertension, NSAID induced gastritis, cyclical vomiting syndrome from marijuana use presents to hospital secondary to nausea and vomiting. EXAM: ABDOMEN - 1 VIEW COMPARISON:  09/28/2016 FINDINGS: The bowel gas pattern is normal. No radio-opaque calculi or other significant radiographic abnormality are seen. IMPRESSION: Negative. Electronically Signed   By: Norva Pavlov M.D.   On: 03/08/2018 15:58    EKG:   Orders placed or performed during the hospital encounter of 06/07/17  . ED EKG  . ED EKG  . EKG 12-Lead  . EKG 12-Lead  . EKG      Management plans discussed with the patient, family and they are in agreement.  CODE STATUS:     Code Status Orders  (From admission, onward)         Start     Ordered   03/08/18 1633  Full code  Continuous     03/08/18 1632        Code Status History    Date Active Date Inactive Code Status Order ID Comments User Context   06/07/2017 1142 06/09/2017 2019 Full Code [id number]  Ramonita Lab, MD Inpatient   03/21/2017 0358 03/22/2017 1517 Full Code [id number]  Ihor Austin, MD Inpatient   11/25/2016 0127 11/26/2016 1554 Full Code [id number]  Oralia Manis, MD Inpatient   06/23/2016 1048 06/25/2016 2027 Full Code [id number]  Arnaldo Natal, MD Inpatient   01/25/2016 2057 01/26/2016 1620 Full Code [id number]  Arnaldo Natal, MD Inpatient   12/20/2014 0327 12/21/2014 1847 Full Code [id number]  Hower, Cletis Athens, MD ED      TOTAL TIME TAKING CARE OF THIS PATIENT: 43 minutes.   Note: This dictation was prepared with Dragon dictation along with  smaller phrase technology. Any transcriptional errors that result from this process are unintentional.   @[facility] @  on 03/10/2018 at 12:41 PM  Between 7am to 6pm - Pager - [phone]  After 6pm go to [email or web address] - password [email or web address] [facility] Hospitalists  Office  [phone]  CC: Primary care physician; Patient, No Pcp Per

## 2018-03-10 NOTE — Discharge Instructions (Signed)
Follow-up with primary care physician in 4 to 5 days

## 2018-03-10 NOTE — Progress Notes (Signed)
Brandon Allen  A and O x 4. VSS. Pt tolerating diet well. No complaints of pain or nausea. IV removed intact, prescriptions given. Pt voiced understanding of discharge instructions with no further questions. Pt discharged via wheelchair with axillary.    Allergies as of 03/10/2018      Reactions   Morphine And Related Nausea And Vomiting   Zofran [ondansetron Hcl] Nausea And Vomiting      Medication List    STOP taking these medications   cloNIDine 0.3 MG tablet Commonly known as:  CATAPRES   HYDROcodone-acetaminophen 5-325 MG tablet Commonly known as:  NORCO/VICODIN   traMADol 50 MG tablet Commonly known as:  ULTRAM     TAKE these medications   acetaminophen 325 MG tablet Commonly known as:  TYLENOL Take 2 tablets (650 mg total) by mouth every 6 (six) hours as needed for mild pain.   cloNIDine 0.3 mg/24hr patch Commonly known as:  CATAPRES - Dosed in mg/24 hr Place 1 patch (0.3 mg total) onto the skin once a week. Start taking on:  03/15/2018   ibuprofen 600 MG tablet Commonly known as:  ADVIL,MOTRIN Take 1 tablet (600 mg total) by mouth every 6 (six) hours as needed.   metoprolol tartrate 25 MG tablet Commonly known as:  LOPRESSOR Take 0.5 tablets (12.5 mg total) by mouth 2 (two) times daily.   nicotine 14 mg/24hr patch Commonly known as:  NICODERM CQ - dosed in mg/24 hours Place 1 patch (14 mg total) onto the skin daily. Start taking on:  03/11/2018   pantoprazole 40 MG tablet Commonly known as:  PROTONIX Take 1 tablet (40 mg total) 2 (two) times daily by mouth.   promethazine 12.5 MG tablet Commonly known as:  PHENERGAN Take 1 tablet (12.5 mg total) by mouth every 8 (eight) hours as needed for nausea or vomiting. What changed:  when to take this       Vitals:   03/10/18 0755 03/10/18 1150  BP: 122/77 121/73  Pulse: 64 62  Resp:  16  Temp:  98.1 F (36.7 C)  SpO2: 99% 100%    Brandon Allen

## 2018-05-26 ENCOUNTER — Telehealth: Payer: Self-pay | Admitting: Pharmacy Technician

## 2018-05-26 NOTE — Telephone Encounter (Signed)
Patient failed to provide 2019 poi.  No additional medication assistance will be provided by [facility] without the required proof of income documentation.  Patient notified by letter.  Azell Bill J. Phallon Haydu Care Manager [facility] 

## 2018-06-07 ENCOUNTER — Observation Stay
Admission: EM | Admit: 2018-06-07 | Discharge: 2018-06-09 | Disposition: A | Discharge status: Home / Self Care | Payer: BLUE CROSS/BLUE SHIELD | Attending: Specialist | Admitting: Specialist

## 2018-06-07 ENCOUNTER — Emergency Department: Payer: BLUE CROSS/BLUE SHIELD

## 2018-06-07 ENCOUNTER — Other Ambulatory Visit: Payer: Self-pay

## 2018-06-07 DIAGNOSIS — I10 Essential (primary) hypertension: Secondary | ICD-10-CM | POA: Insufficient documentation

## 2018-06-07 DIAGNOSIS — R748 Abnormal levels of other serum enzymes: Secondary | ICD-10-CM | POA: Diagnosis not present

## 2018-06-07 DIAGNOSIS — R112 Nausea with vomiting, unspecified: Secondary | ICD-10-CM

## 2018-06-07 DIAGNOSIS — R109 Unspecified abdominal pain: Secondary | ICD-10-CM | POA: Diagnosis present

## 2018-06-07 DIAGNOSIS — R1115 Cyclical vomiting syndrome unrelated to migraine: Secondary | ICD-10-CM | POA: Insufficient documentation

## 2018-06-07 DIAGNOSIS — R1013 Epigastric pain: Secondary | ICD-10-CM | POA: Diagnosis not present

## 2018-06-07 DIAGNOSIS — F17210 Nicotine dependence, cigarettes, uncomplicated: Secondary | ICD-10-CM | POA: Diagnosis not present

## 2018-06-07 DIAGNOSIS — I160 Hypertensive urgency: Principal | ICD-10-CM

## 2018-06-07 DIAGNOSIS — Z9119 Patient's noncompliance with other medical treatment and regimen: Secondary | ICD-10-CM | POA: Insufficient documentation

## 2018-06-07 DIAGNOSIS — K219 Gastro-esophageal reflux disease without esophagitis: Secondary | ICD-10-CM | POA: Insufficient documentation

## 2018-06-07 LAB — CBC WITH DIFFERENTIAL/PLATELET
Abs Immature Granulocytes: 0.16 10*3/uL — ABNORMAL HIGH (ref 0.00–0.07)
BASOS ABS: 0.2 10*3/uL — AB (ref 0.0–0.1)
Basophils Relative: 1 %
EOS PCT: 1 %
Eosinophils Absolute: 0.1 10*3/uL (ref 0.0–0.5)
HEMATOCRIT: 50.1 % (ref 39.0–52.0)
HEMOGLOBIN: 16.9 g/dL (ref 13.0–17.0)
IMMATURE GRANULOCYTES: 1 %
LYMPHS ABS: 2.9 10*3/uL (ref 0.7–4.0)
LYMPHS PCT: 14 %
MCH: 31.1 pg (ref 26.0–34.0)
MCHC: 33.7 g/dL (ref 30.0–36.0)
MCV: 92.1 fL (ref 80.0–100.0)
Monocytes Absolute: 1.2 10*3/uL — ABNORMAL HIGH (ref 0.1–1.0)
Monocytes Relative: 6 %
NEUTROS PCT: 77 %
NRBC: 0 % (ref 0.0–0.2)
Neutro Abs: 15.8 10*3/uL — ABNORMAL HIGH (ref 1.7–7.7)
Platelets: 231 10*3/uL (ref 150–400)
RBC: 5.44 MIL/uL (ref 4.22–5.81)
RDW: 11.8 % (ref 11.5–15.5)
WBC: 20.2 10*3/uL — ABNORMAL HIGH (ref 4.0–10.5)

## 2018-06-07 LAB — URINALYSIS, COMPLETE (UACMP) WITH MICROSCOPIC
BILIRUBIN URINE: NEGATIVE
Bacteria, UA: NONE SEEN
Glucose, UA: NEGATIVE mg/dL
HGB URINE DIPSTICK: NEGATIVE
Ketones, ur: NEGATIVE mg/dL
LEUKOCYTES UA: NEGATIVE
NITRITE: NEGATIVE
Protein, ur: NEGATIVE mg/dL
SQUAMOUS EPITHELIAL / LPF: NONE SEEN (ref 0–5)
pH: 8 (ref 5.0–8.0)

## 2018-06-07 LAB — COMPREHENSIVE METABOLIC PANEL
ALBUMIN: 5.1 g/dL — AB (ref 3.5–5.0)
ALK PHOS: 66 U/L (ref 38–126)
ALT: 18 U/L (ref 0–44)
ANION GAP: 11 (ref 5–15)
AST: 32 U/L (ref 15–41)
BUN: 7 mg/dL (ref 6–20)
CO2: 23 mmol/L (ref 22–32)
Calcium: 10.6 mg/dL — ABNORMAL HIGH (ref 8.9–10.3)
Chloride: 110 mmol/L (ref 98–111)
Creatinine, Ser: 1.01 mg/dL (ref 0.61–1.24)
GFR calc Af Amer: >60 mL/min (ref >60)
GFR calc non Af Amer: >60 mL/min (ref >60)
GLUCOSE: 155 mg/dL — AB (ref 70–99)
POTASSIUM: 4.1 mmol/L (ref 3.5–5.1)
Sodium: 144 mmol/L (ref 135–145)
Total Bilirubin: 1.2 mg/dL (ref 0.3–1.2)
Total Protein: 8.7 g/dL — ABNORMAL HIGH (ref 6.5–8.1)

## 2018-06-07 LAB — LIPASE, BLOOD: Lipase: 31 U/L (ref 11–51)

## 2018-06-07 LAB — URINE DRUG SCREEN, QUALITATIVE (ARMC ONLY)
Amphetamines, Ur Screen: NOT DETECTED
BARBITURATES, UR SCREEN: NOT DETECTED
Benzodiazepine, Ur Scrn: NOT DETECTED
COCAINE METABOLITE, UR ~~LOC~~: NOT DETECTED
Cannabinoid 50 Ng, Ur ~~LOC~~: POSITIVE — AB
MDMA (Ecstasy)Ur Screen: NOT DETECTED
METHADONE SCREEN, URINE: NOT DETECTED
Opiate, Ur Screen: NOT DETECTED
Phencyclidine (PCP) Ur S: NOT DETECTED
Tricyclic, Ur Screen: NOT DETECTED

## 2018-06-07 LAB — TROPONIN I: Troponin I: <0.03 ng/mL (ref <0.03)

## 2018-06-07 MED ORDER — LORAZEPAM 2 MG/ML IJ SOLN
1.0000 mg | Freq: Once | INTRAMUSCULAR | Status: AC
  Administered 2018-06-07: 1 mg via INTRAVENOUS
  Filled 2018-06-07: qty 1

## 2018-06-07 MED ORDER — SODIUM CHLORIDE 0.9 % IV BOLUS
1000.0000 mL | Freq: Once | INTRAVENOUS | Status: AC
  Administered 2018-06-07: 1000 mL via INTRAVENOUS

## 2018-06-07 MED ORDER — LABETALOL HCL 5 MG/ML IV SOLN
5.0000 mg | Freq: Once | INTRAVENOUS | Status: AC
  Administered 2018-06-07: 5 mg via INTRAVENOUS
  Filled 2018-06-07 (×2): qty 4

## 2018-06-07 MED ORDER — LABETALOL HCL 5 MG/ML IV SOLN
5.0000 mg | Freq: Once | INTRAVENOUS | Status: AC
  Administered 2018-06-07: 5 mg via INTRAVENOUS
  Filled 2018-06-07: qty 4

## 2018-06-07 MED ORDER — HYDRALAZINE HCL 20 MG/ML IJ SOLN
10.0000 mg | INTRAMUSCULAR | Status: DC | PRN
  Administered 2018-06-07: 10 mg via INTRAVENOUS
  Filled 2018-06-07: qty 1

## 2018-06-07 MED ORDER — IOPAMIDOL (ISOVUE-370) INJECTION 76%
125.0000 mL | Freq: Once | INTRAVENOUS | Status: AC | PRN
  Administered 2018-06-07: 125 mL via INTRAVENOUS

## 2018-06-07 MED ORDER — NITROGLYCERIN 2 % TD OINT
1.0000 [in_us] | TOPICAL_OINTMENT | Freq: Once | TRANSDERMAL | Status: AC
  Administered 2018-06-07: 1 [in_us] via TOPICAL

## 2018-06-07 MED ORDER — HALOPERIDOL LACTATE 5 MG/ML IJ SOLN: 5.0000 mg | Freq: Once | INTRAMUSCULAR | Status: DC

## 2018-06-07 MED ORDER — FENTANYL CITRATE (PF) 100 MCG/2ML IJ SOLN
100.0000 ug | INTRAMUSCULAR | Status: DC | PRN
  Administered 2018-06-07: 100 ug via INTRAVENOUS
  Filled 2018-06-07: qty 2

## 2018-06-07 MED ORDER — NITROGLYCERIN 2 % TD OINT
TOPICAL_OINTMENT | TRANSDERMAL | Status: AC
  Administered 2018-06-07: 1 [in_us] via TOPICAL
  Filled 2018-06-07: qty 1

## 2018-06-07 MED ORDER — LABETALOL HCL 5 MG/ML IV SOLN: 10.0000 mg | INTRAVENOUS | Status: DC | PRN

## 2018-06-07 MED ORDER — PROMETHAZINE HCL 25 MG/ML IJ SOLN
INTRAMUSCULAR | Status: AC
Start: 2018-06-07 — End: 2018-06-07
  Administered 2018-06-07: 25 mg via INTRAVENOUS
  Filled 2018-06-07: qty 1

## 2018-06-07 MED ORDER — PROMETHAZINE HCL 25 MG/ML IJ SOLN
25.0000 mg | Freq: Four times a day (QID) | INTRAMUSCULAR | Status: DC | PRN
  Administered 2018-06-08 (×3): 25 mg via INTRAVENOUS
  Filled 2018-06-07 (×3): qty 1

## 2018-06-07 MED ORDER — HALOPERIDOL LACTATE 5 MG/ML IJ SOLN
5.0000 mg | Freq: Once | INTRAMUSCULAR | Status: DC
  Filled 2018-06-07 (×2): qty 1

## 2018-06-07 MED ORDER — SODIUM CHLORIDE 0.9 % IV SOLN
Freq: Once | INTRAVENOUS | Status: AC
  Administered 2018-06-07: 22:00:00 via INTRAVENOUS

## 2018-06-07 MED ORDER — PROMETHAZINE HCL 25 MG/ML IJ SOLN
12.5000 mg | Freq: Four times a day (QID) | INTRAMUSCULAR | Status: DC | PRN
  Administered 2018-06-07: 25 mg via INTRAVENOUS

## 2018-06-07 NOTE — ED Provider Notes (Signed)
[facility] Emergency Department Provider Note    First MD Initiated Contact with Patient 06/07/18 1913     (approximate)  I have reviewed the triage vital signs and the nursing notes.   HISTORY  Chief Complaint Abdominal Pain and Hypertension    HPI Albert Hersch is a 37 y.o. male presents to the ER for evaluation of epigastric pain nausea and vomiting.  Patient states that he woke up from sleep due to severe pain which is had in the past.  Has had nausea and vomiting since early this morning.  Denies any numbness or tingling.  No headache.  States he was previously like this related to his gallbladder but is status post cholecystectomy.  Patient found by EMS found to be markedly hypertensive and tachycardic and diaphoretic.  Denies any chest pain or pressure.  No shortness of breath.  Denies any alcohol use.  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  Denies any illicit drug use.  Denies any fevers.   No blurry vision.  Has not taken anything for the emesis.  Was given antihypertensive in route.   Past Medical History:  Diagnosis Date  . Atrial fibrillation (HCC)    Dr Mariah Milling  . 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 Deafness in right ear   . HTN (hypertension) 12/20/2014  . Kidney stones   . NSAID induced gastritis    Family History  Problem Relation Age of Onset  . Heart failure Other   . Colon cancer Maternal Grandfather 50  . Hypertension Paternal Grandfather    Past Surgical History:  Procedure Laterality Date  . CHOLECYSTECTOMY    . ESOPHAGOGASTRODUODENOSCOPY    . ESOPHAGOGASTRODUODENOSCOPY  11/2011   NSAID-induced gastritis (Dr Bluford Kaufmann)  . ESOPHAGOGASTRODUODENOSCOPY N/A 06/24/2016   Procedure: ESOPHAGOGASTRODUODENOSCOPY (EGD);  Surgeon: Scot Jun, MD;  Location: [facility] ENDOSCOPY;  Service: Endoscopy;  Laterality: N/A;  . INNER EAR SURGERY     Patient Active Problem List   Diagnosis Date  Noted  . Nausea and vomiting 03/08/2018  . Intractable nausea and vomiting 06/07/2017  . Opiate abuse, episodic (HCC) 06/07/2017  . Tobacco abuse 04/06/2017  . Nausea & vomiting 03/21/2017  . Hypertensive urgency 03/21/2017  . GERD (gastroesophageal reflux disease) 11/24/2016  . Leukocytosis 01/26/2016  . Hyperglycemia 01/26/2016  . Essential hypertension 01/26/2016  . Cannabis abuse 01/26/2016  . Cyclic vomiting syndrome 01/25/2016  . Atrial fibrillation with rapid ventricular response (HCC) 12/20/2014  . HTN (hypertension) 12/20/2014      Prior to Admission medications   Medication Sig Start Date End Date Taking? Authorizing Provider  acetaminophen (TYLENOL) 325 MG tablet Take 2 tablets (650 mg total) by mouth every 6 (six) hours as needed for mild pain. 03/10/18   Ramonita Lab, MD  cloNIDine (CATAPRES - DOSED IN MG/24 HR) 0.3 mg/24hr patch Place 1 patch (0.3 mg total) onto the skin once a week. 03/15/18   Ramonita Lab, MD  ibuprofen (ADVIL,MOTRIN) 600 MG tablet Take 1 tablet (600 mg total) by mouth every 6 (six) hours as needed. 02/10/18   Dionne Bucy, MD  metoprolol tartrate (LOPRESSOR) 25 MG tablet Take 0.5 tablets (12.5 mg total) by mouth 2 (two) times daily. 03/10/18   Gouru, Deanna Artis, MD  nicotine (NICODERM CQ - DOSED IN MG/24 HOURS) 14 mg/24hr patch Place 1 patch (14 mg total) onto the skin daily. 03/11/18   Ramonita Lab, MD  pantoprazole (PROTONIX) 40 MG tablet Take  1 tablet (40 mg total) 2 (two) times daily by mouth. Patient not taking: Reported on 03/08/2018 06/09/17   Ramonita Lab, MD  promethazine (PHENERGAN) 12.5 MG tablet Take 1 tablet (12.5 mg total) by mouth every 8 (eight) hours as needed for nausea or vomiting. 03/10/18   Ramonita Lab, MD    Allergies Morphine and related and Zofran Frazier Richards hcl]    Social History Social History   Tobacco Use  . Smoking status: Current Every Day Smoker    Packs/day: 0.50    Types: Cigarettes    Start date: 12/19/2009  .  Smokeless tobacco: Never Used  Substance Use Topics  . Alcohol use: No    Alcohol/week: 0.0 standard drinks  . Drug use: Yes    Types: Marijuana    Comment: every other day    Review of Systems Patient denies headaches, rhinorrhea, blurry vision, numbness, shortness of breath, chest pain, edema, cough, abdominal pain, nausea, vomiting, diarrhea, dysuria, fevers, rashes or hallucinations unless otherwise stated above in HPI. ____________________________________________   PHYSICAL EXAM:  VITAL SIGNS: Vitals:   06/07/18 2038 06/07/18 2040  BP:  (!) 197/126  Pulse: 78 82  Resp: 12 16  Temp:    SpO2: 100% 100%    Constitutional: Alert and oriented, nephrotic and ill-appearing.  Intimately vomiting but protecting his airway and able to answer questions. Eyes: Conjunctivae are normal.  Head: Atraumatic. Nose: No congestion/rhinnorhea. Mouth/Throat: Mucous membranes are moist.   Neck: No stridor. Painless ROM.  Cardiovascular: Normal rate, regular rhythm. Grossly normal heart sounds.  Good peripheral circulation. Respiratory: Normal respiratory effort.  No retractions. Lungs CTAB. Gastrointestinal: Soft but ttp in epigastric region. No distention. No abdominal bruits. No CVA tenderness. Genitourinary: deferred Musculoskeletal: No lower extremity tenderness nor edema.  No joint effusions. Neurologic:  Normal speech and language. No gross focal neurologic deficits are appreciated. No facial droop Skin:  Skin is warm, diaphoretic and intact. No rash noted. Psychiatric: Mood and affect are anxious   ____________________________________________   LABS (all labs ordered are listed, but only abnormal results are displayed)  Results for orders placed or performed during the hospital encounter of 06/07/18 (from the past 24 hour(s))  CBC with Differential/Platelet     Status: Abnormal   Collection Time: 06/07/18  7:20 PM  Result Value Ref Range   WBC 20.2 (H) 4.0 - 10.5 K/uL   RBC  5.44 4.22 - 5.81 MIL/uL   Hemoglobin 16.9 13.0 - 17.0 g/dL   HCT 16.1 09.6 - 04.5 %   MCV 92.1 80.0 - 100.0 fL   MCH 31.1 26.0 - 34.0 pg   MCHC 33.7 30.0 - 36.0 g/dL   RDW 40.9 81.1 - 91.4 %   Platelets 231 150 - 400 K/uL   nRBC 0.0 0.0 - 0.2 %   Neutrophils Relative % 77 %   Neutro Abs 15.8 (H) 1.7 - 7.7 K/uL   Lymphocytes Relative 14 %   Lymphs Abs 2.9 0.7 - 4.0 K/uL   Monocytes Relative 6 %   Monocytes Absolute 1.2 (H) 0.1 - 1.0 K/uL   Eosinophils Relative 1 %   Eosinophils Absolute 0.1 0.0 - 0.5 K/uL   Basophils Relative 1 %   Basophils Absolute 0.2 (H) 0.0 - 0.1 K/uL   Immature Granulocytes 1 %   Abs Immature Granulocytes 0.16 (H) 0.00 - 0.07 K/uL  Comprehensive metabolic panel     Status: Abnormal   Collection Time: 06/07/18  7:20 PM  Result Value Ref Range  Sodium 144 135 - 145 mmol/L   Potassium 4.1 3.5 - 5.1 mmol/L   Chloride 110 98 - 111 mmol/L   CO2 23 22 - 32 mmol/L   Glucose, Bld 155 (H) 70 - 99 mg/dL   BUN 7 6 - 20 mg/dL   Creatinine, Ser 1.61 0.61 - 1.24 mg/dL   Calcium 09.6 (H) 8.9 - 10.3 mg/dL   Total Protein 8.7 (H) 6.5 - 8.1 g/dL   Albumin 5.1 (H) 3.5 - 5.0 g/dL   AST 32 15 - 41 U/L   ALT 18 0 - 44 U/L   Alkaline Phosphatase 66 38 - 126 U/L   Total Bilirubin 1.2 0.3 - 1.2 mg/dL   GFR calc non Af Amer >60 >60 mL/min   GFR calc Af Amer >60 >60 mL/min   Anion gap 11 5 - 15  Troponin I     Status: None   Collection Time: 06/07/18  7:20 PM  Result Value Ref Range   Troponin I <0.03 <0.03 ng/mL  Urinalysis, Complete w Microscopic     Status: Abnormal   Collection Time: 06/07/18  7:20 PM  Result Value Ref Range   Color, Urine STRAW (A) YELLOW   APPearance CLEAR (A) CLEAR   Specific Gravity, Urine >1.046 (H) 1.005 - 1.030   pH 8.0 5.0 - 8.0   Glucose, UA NEGATIVE NEGATIVE mg/dL   Hgb urine dipstick NEGATIVE NEGATIVE   Bilirubin Urine NEGATIVE NEGATIVE   Ketones, ur NEGATIVE NEGATIVE mg/dL   Protein, ur NEGATIVE NEGATIVE mg/dL   Nitrite NEGATIVE  NEGATIVE   Leukocytes, UA NEGATIVE NEGATIVE   RBC / HPF 0-5 0 - 5 RBC/hpf   WBC, UA 0-5 0 - 5 WBC/hpf   Bacteria, UA NONE SEEN NONE SEEN   Squamous Epithelial / LPF NONE SEEN 0 - 5   Mucus PRESENT   Urine Drug Screen, Qualitative ([facility] only)     Status: Abnormal   Collection Time: 06/07/18  7:20 PM  Result Value Ref Range   Tricyclic, Ur Screen NONE DETECTED NONE DETECTED   Amphetamines, Ur Screen NONE DETECTED NONE DETECTED   MDMA (Ecstasy)Ur Screen NONE DETECTED NONE DETECTED   Cocaine Metabolite,Ur [place] NONE DETECTED NONE DETECTED   Opiate, Ur Screen NONE DETECTED NONE DETECTED   Phencyclidine (PCP) Ur S NONE DETECTED NONE DETECTED   Cannabinoid 50 Ng, Ur [place] POSITIVE (A) NONE DETECTED   Barbiturates, Ur Screen NONE DETECTED NONE DETECTED   Benzodiazepine, Ur Scrn NONE DETECTED NONE DETECTED   Methadone Scn, Ur NONE DETECTED NONE DETECTED  Lipase, blood     Status: None   Collection Time: 06/07/18  7:20 PM  Result Value Ref Range   Lipase 31 11 - 51 U/L   ____________________________________________  EKG My review and personal interpretation at Time: 19:15   Indication: htn  Rate: 70  Rhythm: sinus Axis: normal Other: concave upward st segment, no stemi or reciprocal changes, likely BER ____________________________________________  RADIOLOGY  I personally reviewed all radiographic images ordered to evaluate for the above acute complaints and reviewed radiology reports and findings.  These findings were personally discussed with the patient.  Please see medical record for radiology report.  ____________________________________________   PROCEDURES  Procedure(s) performed:  .Critical Care Performed by: Willy Eddy, MD Authorized by: Willy Eddy, MD   Critical care provider statement:    Critical care time (minutes):  30   Critical care time was exclusive of:  Separately billable procedures and treating other patients   Critical  care was necessary to treat  or prevent imminent or life-threatening deterioration of the following conditions:  Circulatory failure   Critical care was time spent personally by me on the following activities:  Development of treatment plan with patient or surrogate, discussions with consultants, evaluation of patient's response to treatment, examination of patient, obtaining history from patient or surrogate, ordering and performing treatments and interventions, ordering and review of laboratory studies, ordering and review of radiographic studies, pulse oximetry, re-evaluation of patient's condition and review of old charts      Critical Care performed: yes ____________________________________________   INITIAL IMPRESSION / ASSESSMENT AND PLAN / ED COURSE  Pertinent labs & imaging results that were available during my care of the patient were reviewed by me and considered in my medical decision making (see chart for details).   DDX: Hypertensive emergency, AAA, dissection, ACS, pancreatitis, perforation, diverticulitis, withdrawal, drug effect, cyclic vomiting syndrome, gastroparesis  Jamaul Heist is a 37 y.o. who presents to the ED with symptoms as described above.  He is afebrile but hypertensive and ill-appearing.  We will give IV fluids as well as symptomatic management.  Initial EKG does not show any signs of STEMI but given diaphoresis will evaluate for ACS.  No focal neuro deficits to suggest acute intracranial process at this time.  Very tender on exam but also reporting some chest pain radiating through to his back given his marked hypertension will be given labetalol and sent to CT scanner for CT angiogram to evaluate for the above differential.  Clinical Course as of Jun 07 2199  Tue Jun 07, 2018  2033 Patient with persistent vomiting and dry heaving.  My review of CT angiogram does not show any evidence of aneurysm or dissection.  Will give additional antiemetic in the form of IV Haldol.   [PR]  2058  Reassessment.  Patient now much more comfortable and resting.  Still slightly diaphoretic.  Troponin initially negative.  Blood pressure is downtrending.  We will hold off on Haldol at this time.  CT angiogram does not show any abnormality.  Will also order CT head to exclude intracranial abnormality.  Doubt encephalitis or infectious process.  Leukocytosis likely secondary to forceful vomiting.   [PR]  2159 Repeat EKG does not show any ischemic changes.  Patient more comfortable at this time.  Not diaphoretic.  Still hypertensive therefore will need additional antihypertensives.  Have a high suspicion this is related to hypertensive urgency as the patient's been unable to tolerate his clonidine.  I will discuss the case with hospitalist for admission for further hemodynamic monitoring, serial enzymes and blood pressure control.   [PR]    Clinical Course User Index [PR] Willy Eddy, MD     As part of my medical decision making, I reviewed the following data within the electronic MEDICAL RECORD NUMBER Nursing notes reviewed and incorporated, Labs reviewed, notes from prior ED visits.   ____________________________________________   FINAL CLINICAL IMPRESSION(S) / ED DIAGNOSES  Final diagnoses:  Hypertensive urgency  Intractable nausea and vomiting      NEW MEDICATIONS STARTED DURING THIS VISIT:  New Prescriptions   No medications on file     Note:  This document was prepared using Dragon voice recognition software and may include unintentional dictation errors.    Willy Eddy, MD 06/07/18 2200

## 2018-06-07 NOTE — H&P (Signed)
[facility] - [place] at [facility]   PATIENT NAME: Brandon Allen    MR#:  [id number]  DATE OF BIRTH:  1980/11/08  DATE OF ADMISSION:  06/07/2018  PRIMARY CARE PHYSICIAN: Patient, No Pcp Per   REQUESTING/REFERRING PHYSICIAN: Roxan Hockey, MD  CHIEF COMPLAINT:   Chief Complaint  Patient presents with  . Abdominal Pain  . Hypertension    HISTORY OF PRESENT ILLNESS:  Brandon Allen  is a 37 y.o. male who presents with chief complaint as above.  He presents the ED with significant nausea and vomiting.  His blood pressure is very elevated systolic greater than 200s and diastolic much greater than 100.  He initially reported to ED physician and staff that he is supposed to be taking clonidine but stopped taking it because he did not like how it made him feel.  His blood pressure has been difficult to control in the ED tonight, requiring multiple different antihypertensives but still have not brought him under decent control.  After some time here in the ED he informed staff that clonidine is the only thing that helps him, reversing his initial position when he arrived to the ED.  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  Hospitalist were called for admission  PAST MEDICAL HISTORY:   Past Medical History:  Diagnosis Date  . Atrial fibrillation (HCC)    Dr Mariah Milling  . 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 Deafness in right ear   . HTN (hypertension) 12/20/2014  . Kidney stones   . NSAID induced gastritis      PAST SURGICAL HISTORY:   Past Surgical History:  Procedure Laterality Date  . CHOLECYSTECTOMY    . ESOPHAGOGASTRODUODENOSCOPY    . ESOPHAGOGASTRODUODENOSCOPY  11/2011   NSAID-induced gastritis (Dr Bluford Kaufmann)  . ESOPHAGOGASTRODUODENOSCOPY N/A 06/24/2016   Procedure: ESOPHAGOGASTRODUODENOSCOPY (EGD);  Surgeon: Scot Jun, MD;  Location: [facility] ENDOSCOPY;  Service: Endoscopy;  Laterality: N/A;  . INNER EAR SURGERY        SOCIAL HISTORY:   Social History   Tobacco Use  . Smoking status: Current Every Day Smoker    Packs/day: 0.50    Types: Cigarettes    Start date: 12/19/2009  . Smokeless tobacco: Never Used  Substance Use Topics  . Alcohol use: No    Alcohol/week: 0.0 standard drinks     FAMILY HISTORY:   Family History  Problem Relation Age of Onset  . Heart failure Other   . Colon cancer Maternal Grandfather 50  . Hypertension Paternal Grandfather      DRUG ALLERGIES:   Allergies  Allergen Reactions  . Morphine And Related Nausea And Vomiting  . Zofran [Ondansetron Hcl] Nausea And Vomiting    MEDICATIONS AT HOME:   Prior to Admission medications   Medication Sig Start Date End Date Taking? Authorizing Provider  acetaminophen (TYLENOL) 325 MG tablet Take 2 tablets (650 mg total) by mouth every 6 (six) hours as needed for mild pain. 03/10/18  Yes Gouru, Deanna Artis, MD  cloNIDine (CATAPRES) 0.3 MG tablet Take 0.3 mg by mouth 2 (two) times daily.   Yes [provider]  cloNIDine (CATAPRES - DOSED IN MG/24 HR) 0.3 mg/24hr patch Place 1 patch (0.3 mg total) onto the skin once a week. Patient not taking: Reported on 06/07/2018 03/15/18   Ramonita Lab, MD  ibuprofen (ADVIL,MOTRIN) 600 MG tablet Take 1 tablet (600 mg total) by mouth every 6 (six) hours as needed.  Patient not taking: Reported on 06/07/2018 02/10/18   Dionne Bucy, MD  metoprolol tartrate (LOPRESSOR) 25 MG tablet Take 0.5 tablets (12.5 mg total) by mouth 2 (two) times daily. Patient not taking: Reported on 06/07/2018 03/10/18   Ramonita Lab, MD  nicotine (NICODERM CQ - DOSED IN MG/24 HOURS) 14 mg/24hr patch Place 1 patch (14 mg total) onto the skin daily. Patient not taking: Reported on 06/07/2018 03/11/18   Ramonita Lab, MD  pantoprazole (PROTONIX) 40 MG tablet Take 1 tablet (40 mg total) 2 (two) times daily by mouth. Patient not taking: Reported on 03/08/2018 06/09/17   Ramonita Lab, MD  promethazine (PHENERGAN) 12.5 MG  tablet Take 1 tablet (12.5 mg total) by mouth every 8 (eight) hours as needed for nausea or vomiting. Patient not taking: Reported on 06/07/2018 03/10/18   Ramonita Lab, MD    REVIEW OF SYSTEMS:  Review of Systems  Constitutional: Positive for malaise/fatigue. Negative for chills, fever and weight loss.  HENT: Negative for ear pain, hearing loss and tinnitus.   Eyes: Negative for blurred vision, double vision, pain and redness.  Respiratory: Negative for cough, hemoptysis and shortness of breath.   Cardiovascular: Negative for chest pain, palpitations, orthopnea and leg swelling.  Gastrointestinal: Positive for nausea and vomiting. Negative for abdominal pain, constipation and diarrhea.  Genitourinary: Negative for dysuria, frequency and hematuria.  Musculoskeletal: Negative for back pain, joint pain and neck pain.  Skin:       No acne, rash, or lesions  Neurological: Negative for dizziness, tremors, focal weakness and weakness.  Endo/Heme/Allergies: Negative for polydipsia. Does not bruise/bleed easily.  Psychiatric/Behavioral: Negative for depression. The patient is not nervous/anxious and does not have insomnia.      VITAL SIGNS:   Vitals:   06/07/18 2038 06/07/18 2040 06/07/18 2230 06/07/18 2240  BP:  (!) 197/126 (!) 182/131 (!) 181/131  Pulse: 78 82 76 72  Resp: 12 16  11   Temp:      TempSrc:      SpO2: 100% 100% 100% 100%  Weight:      Height:       Wt Readings from Last 3 Encounters:  06/07/18 88.5 kg  03/08/18 88.5 kg  02/10/18 90.7 kg    PHYSICAL EXAMINATION:  Physical Exam  Vitals reviewed. Constitutional: He is oriented to person, place, and time. He appears well-developed and well-nourished. No distress.  HENT:  Head: Normocephalic and atraumatic.  Mouth/Throat: Oropharynx is clear and moist.  Eyes: Pupils are equal, round, and reactive to light. Conjunctivae and EOM are normal. No scleral icterus.  Neck: Normal range of motion. Neck supple. No JVD present. No  thyromegaly present.  Cardiovascular: Normal rate, regular rhythm and intact distal pulses. Exam reveals no gallop and no friction rub.  No murmur heard. Respiratory: Effort normal and breath sounds normal. No respiratory distress. He has no wheezes. He has no rales.  GI: Soft. Bowel sounds are normal. He exhibits no distension. There is no tenderness.  Musculoskeletal: Normal range of motion. He exhibits no edema.  No arthritis, no gout  Lymphadenopathy:    He has no cervical adenopathy.  Neurological: He is alert and oriented to person, place, and time. No cranial nerve deficit.  No dysarthria, no aphasia  Skin: Skin is warm and dry. No rash noted. No erythema.  Psychiatric: He has a normal mood and affect. His behavior is normal. Judgment and thought content normal.    LABORATORY PANEL:   CBC Recent Labs  Lab 06/07/18 1920  WBC 20.2*  HGB 16.9  HCT 50.1  PLT 231   ------------------------------------------------------------------------------------------------------------------  Chemistries  Recent Labs  Lab 06/07/18 1920  NA 144  K 4.1  CL 110  CO2 23  GLUCOSE 155*  BUN 7  CREATININE 1.01  CALCIUM 10.6*  AST 32  ALT 18  ALKPHOS 66  BILITOT 1.2   ------------------------------------------------------------------------------------------------------------------  Cardiac Enzymes Recent Labs  Lab 06/07/18 1920  TROPONINI <0.03   ------------------------------------------------------------------------------------------------------------------  RADIOLOGY:  Ct Head Wo Contrast  Result Date: 06/07/2018 CLINICAL DATA:  Altered LOC EXAM: CT HEAD WITHOUT CONTRAST TECHNIQUE: Contiguous axial images were obtained from the base of the skull through the vertex without intravenous contrast. COMPARISON:  05/26/2010 head CT FINDINGS: Brain: Study is limited by recent administration of intravascular contrast. This limits evaluation for acute intracranial hemorrhage. No mass  or large vessel territorial infarct is visualized. The ventricles are of normal size Vascular: Increased vascular density consistent with recent contrast administration. Increased density in the dural venous sinuses. Skull: No fracture Sinuses/Orbits: Mucous retention cyst in the right sphenoid sinus Other: None IMPRESSION: Slightly limited exam secondary to the presence of recently administered intravenous contrast. Allowing for this, no definite acute intracranial abnormality Electronically Signed   By: Jasmine Pang M.D.   On: 06/07/2018 21:22   Dg Chest Portable 1 View  Result Date: 06/07/2018 CLINICAL DATA:  Hypertensive crisis.  Abdominal pain. EXAM: PORTABLE CHEST 1 VIEW COMPARISON:  09/28/2016 FINDINGS: The heart size and mediastinal contours are within normal limits. Both lungs are clear. The visualized skeletal structures are unremarkable. IMPRESSION: No active disease. Electronically Signed   By: Paulina Fusi M.D.   On: 06/07/2018 19:49   Ct Angio Chest/abd/pel For Dissection W And/or Wo Contrast  Result Date: 06/07/2018 CLINICAL DATA:  Hypertensive crisis. Chest and abdominal pain. Vomiting. EXAM: CT ANGIOGRAPHY CHEST, ABDOMEN AND PELVIS TECHNIQUE: Multidetector CT imaging through the chest, abdomen and pelvis was performed using the standard protocol during bolus administration of intravenous contrast. Multiplanar reconstructed images and MIPs were obtained and reviewed to evaluate the vascular anatomy. CONTRAST:  ISOVUE-370 IOPAMIDOL (ISOVUE-370) INJECTION 76% COMPARISON:  Chest radiography same day. Multiple previous CT examinations most recently 06/07/2017 FINDINGS: CTA CHEST FINDINGS Cardiovascular: Heart size is normal. No pericardial fluid. The aorta is normal. No aneurysm or dissection. No atherosclerotic change. No coronary artery calcification is visible. Pulmonary artery branches appear normal. Mediastinum/Nodes: Normal Lungs/Pleura: Normal Musculoskeletal: Normal Review of the  MIP images confirms the above findings. CTA ABDOMEN AND PELVIS FINDINGS VASCULAR Aorta: Normal.  No dissection.  No atherosclerosis. Celiac: Normal SMA: Normal Renals: Single on each side.  Normal. IMA: Normal Inflow: Normal Veins: Normal Review of the MIP images confirms the above findings. NON-VASCULAR Hepatobiliary: Normal Pancreas: Normal Spleen: Normal Adrenals/Urinary Tract: Normal Stomach/Bowel: Normal Lymphatic: No adenopathy. Reproductive: Normal Other: No free fluid or air. Musculoskeletal: Normal except for a Schmorl's node within the L5 vertebral body. Review of the MIP images confirms the above findings. IMPRESSION: No vascular abnormality. No aortic dissection. No evidence of atherosclerotic disease. Lungs are clear.  No abdominal or pelvic organ abnormality. No cause of the presenting symptoms is identified. Electronically Signed   By: Paulina Fusi M.D.   On: 06/07/2018 20:52    EKG:   Orders placed or performed during the hospital encounter of 06/07/18  . ED EKG  . ED EKG  . EKG 12-Lead  . EKG 12-Lead    IMPRESSION AND PLAN:  Principal Problem:   Hypertensive urgency -unclear whether  the patient has a has not been taking his clonidine, he is changed his story in the ED.  However, his blood pressure is significantly difficult to control.  Once he told staff that he needed clonidine it was administered to him.  He has multiple other antihypertensives on board as well.  I suspect that having missed some doses of medicine or having stopped taking it at home he then developed the nausea and vomiting, perhaps his cyclic vomiting syndrome has something to do with that as well.  Perhaps cannabinoid use has exacerbated this.  At that point he likely could not keep any antihypertensives down and his symptoms became worse.  He will need to clarify what medications he is able and willing to take to control his blood pressure.  For tonight blood pressure goal will be less than 180/100, this can be  lowered tomorrow with further medications. Active Problems:   Cyclic vomiting syndrome -patient is cannabinoid positive on tox screen here tonight.  He has been counseled about the detriment of cannabinoid use.  PRN antiemetics in place, IV fluids in place   GERD (gastroesophageal reflux disease) -Home dose PPI  Chart review performed and case discussed with ED provider. Labs, imaging and/or ECG reviewed by provider and discussed with patient/family. Management plans discussed with the patient and/or family.  DVT PROPHYLAXIS: SubQ lovenox   GI PROPHYLAXIS:  PPI   ADMISSION STATUS: Observation  CODE STATUS: Full Code Status History    Date Active Date Inactive Code Status Order ID Comments User Context   03/08/2018 1632 03/10/2018 2008 Full Code [id number]  Enid Baas, MD ED   06/07/2017 1142 06/09/2017 2019 Full Code [id number]  Ramonita Lab, MD Inpatient   03/21/2017 0358 03/22/2017 1517 Full Code [id number]  Ihor Austin, MD Inpatient   11/25/2016 0127 11/26/2016 1554 Full Code [id number]  Oralia Manis, MD Inpatient   06/23/2016 1048 06/25/2016 2027 Full Code [id number]  Arnaldo Natal, MD Inpatient   01/25/2016 2057 01/26/2016 1620 Full Code [id number]  Arnaldo Natal, MD Inpatient   12/20/2014 0327 12/21/2014 1847 Full Code [id number]  Hower, Cletis Athens, MD ED      TOTAL TIME TAKING CARE OF THIS PATIENT: 40 minutes.   Brandon Allen 06/07/2018, 11:08 PM  [employer]  [phone]  CC: Primary care physician; Patient, No Pcp Per  Note:  This document was prepared using Dragon voice recognition software and may include unintentional dictation errors.

## 2018-06-07 NOTE — ED Triage Notes (Signed)
Pt arrived via [facility] at home with c/o HTN crisis and abdominal pain. EMS states pt was vomiting starting at 1600 and then started getting abdominal pain as well as really diaphoretic. EMS states the pt ran out of clonidine yesterday and did not take any today. Pt given 5 mg metoprolol en route as well as 1 sublingual nitro spray.

## 2018-06-08 ENCOUNTER — Observation Stay (HOSPITAL_BASED_OUTPATIENT_CLINIC_OR_DEPARTMENT_OTHER)
Admit: 2018-06-08 | Discharge: 2018-06-08 | Disposition: A | Discharge status: Home / Self Care | Payer: BLUE CROSS/BLUE SHIELD | Attending: Specialist | Admitting: Specialist

## 2018-06-08 DIAGNOSIS — I5030 Unspecified diastolic (congestive) heart failure: Secondary | ICD-10-CM

## 2018-06-08 LAB — BASIC METABOLIC PANEL
ANION GAP: 9 (ref 5–15)
BUN: 8 mg/dL (ref 6–20)
CALCIUM: 9.8 mg/dL (ref 8.9–10.3)
CO2: 23 mmol/L (ref 22–32)
Chloride: 110 mmol/L (ref 98–111)
Creatinine, Ser: 0.86 mg/dL (ref 0.61–1.24)
Glucose, Bld: 132 mg/dL — ABNORMAL HIGH (ref 70–99)
Potassium: 3.7 mmol/L (ref 3.5–5.1)
Sodium: 142 mmol/L (ref 135–145)

## 2018-06-08 LAB — CBC
HCT: 45.9 % (ref 39.0–52.0)
Hemoglobin: 15.7 g/dL (ref 13.0–17.0)
MCH: 31 pg (ref 26.0–34.0)
MCHC: 34.2 g/dL (ref 30.0–36.0)
MCV: 90.5 fL (ref 80.0–100.0)
NRBC: 0 % (ref 0.0–0.2)
Platelets: 257 10*3/uL (ref 150–400)
RBC: 5.07 MIL/uL (ref 4.22–5.81)
RDW: 11.8 % (ref 11.5–15.5)
WBC: 15 10*3/uL — ABNORMAL HIGH (ref 4.0–10.5)

## 2018-06-08 LAB — ECHOCARDIOGRAM COMPLETE
HEIGHTINCHES: 72 in
WEIGHTICAEL: 2880 [oz_av]

## 2018-06-08 LAB — TROPONIN I
TROPONIN I: 0.23 ng/mL — AB (ref <0.03)
Troponin I: 0.15 ng/mL (ref <0.03)
Troponin I: 0.27 ng/mL (ref <0.03)

## 2018-06-08 MED ORDER — KETOROLAC TROMETHAMINE 30 MG/ML IJ SOLN
30.0000 mg | Freq: Four times a day (QID) | INTRAMUSCULAR | Status: DC | PRN
  Administered 2018-06-08: 30 mg via INTRAVENOUS
  Filled 2018-06-08: qty 1

## 2018-06-08 MED ORDER — LABETALOL HCL 5 MG/ML IV SOLN
20.0000 mg | INTRAVENOUS | Status: DC | PRN
  Administered 2018-06-08 (×3): 20 mg via INTRAVENOUS
  Filled 2018-06-08 (×3): qty 4

## 2018-06-08 MED ORDER — HYDRALAZINE HCL 20 MG/ML IJ SOLN
20.0000 mg | INTRAMUSCULAR | Status: DC | PRN
  Administered 2018-06-08: 20 mg via INTRAVENOUS
  Filled 2018-06-08 (×2): qty 1

## 2018-06-08 MED ORDER — ENOXAPARIN SODIUM 40 MG/0.4ML ~~LOC~~ SOLN
40.0000 mg | SUBCUTANEOUS | Status: DC
  Administered 2018-06-08 – 2018-06-09 (×2): 40 mg via SUBCUTANEOUS
  Filled 2018-06-08 (×2): qty 0.4

## 2018-06-08 MED ORDER — METOCLOPRAMIDE HCL 5 MG/ML IJ SOLN
5.0000 mg | Freq: Three times a day (TID) | INTRAMUSCULAR | Status: DC | PRN
  Filled 2018-06-08: qty 2

## 2018-06-08 MED ORDER — CLONIDINE HCL 0.1 MG PO TABS
ORAL_TABLET | ORAL | Status: AC
  Administered 2018-06-08: 0.3 mg via ORAL
  Filled 2018-06-08: qty 3

## 2018-06-08 MED ORDER — ENALAPRILAT 1.25 MG/ML IV SOLN
0.6250 mg | Freq: Four times a day (QID) | INTRAVENOUS | Status: DC
  Administered 2018-06-08: 0.625 mg via INTRAVENOUS
  Filled 2018-06-08 (×2): qty 0.5

## 2018-06-08 MED ORDER — NITROGLYCERIN 2 % TD OINT
1.0000 [in_us] | TOPICAL_OINTMENT | Freq: Four times a day (QID) | TRANSDERMAL | Status: DC
  Administered 2018-06-08 (×3): 1 [in_us] via TOPICAL
  Filled 2018-06-08 (×4): qty 1

## 2018-06-08 MED ORDER — FAMOTIDINE IN NACL 20-0.9 MG/50ML-% IV SOLN
20.0000 mg | Freq: Two times a day (BID) | INTRAVENOUS | Status: DC
  Administered 2018-06-08 (×2): 20 mg via INTRAVENOUS
  Filled 2018-06-08 (×2): qty 50

## 2018-06-08 MED ORDER — LABETALOL HCL 5 MG/ML IV SOLN
20.0000 mg | INTRAVENOUS | Status: DC | PRN
  Administered 2018-06-08: 20 mg via INTRAVENOUS
  Filled 2018-06-08: qty 4

## 2018-06-08 MED ORDER — HYDRALAZINE HCL 20 MG/ML IJ SOLN: 20.0000 mg | INTRAMUSCULAR | Status: DC | PRN

## 2018-06-08 MED ORDER — CLONIDINE HCL 0.3 MG/24HR TD PTWK
0.3000 mg | MEDICATED_PATCH | Freq: Once | TRANSDERMAL | Status: DC
  Administered 2018-06-08: 0.3 mg via TRANSDERMAL
  Filled 2018-06-08: qty 1

## 2018-06-08 MED ORDER — ACETAMINOPHEN 650 MG RE SUPP: 650.0000 mg | Freq: Four times a day (QID) | RECTAL | Status: DC | PRN

## 2018-06-08 MED ORDER — ENALAPRILAT 1.25 MG/ML IV SOLN
1.2500 mg | Freq: Four times a day (QID) | INTRAVENOUS | Status: DC
  Administered 2018-06-08 – 2018-06-09 (×3): 1.25 mg via INTRAVENOUS
  Filled 2018-06-08 (×4): qty 1

## 2018-06-08 MED ORDER — ACETAMINOPHEN 325 MG PO TABS
650.0000 mg | ORAL_TABLET | Freq: Four times a day (QID) | ORAL | Status: DC | PRN
  Administered 2018-06-08: 650 mg via ORAL
  Filled 2018-06-08: qty 2

## 2018-06-08 MED ORDER — CLONIDINE HCL 0.1 MG PO TABS
0.3000 mg | ORAL_TABLET | Freq: Once | ORAL | Status: AC
  Administered 2018-06-08: 0.3 mg via ORAL

## 2018-06-08 MED ORDER — SODIUM CHLORIDE 0.9 % IV SOLN
INTRAVENOUS | Status: DC
  Administered 2018-06-08 (×2): via INTRAVENOUS

## 2018-06-08 MED ORDER — VERAPAMIL HCL 2.5 MG/ML IV SOLN
5.0000 mg | Freq: Once | INTRAVENOUS | Status: AC
  Administered 2018-06-08: 5 mg via INTRAVENOUS
  Filled 2018-06-08: qty 2

## 2018-06-08 NOTE — Progress Notes (Signed)
Patient received Labetalol 20 mg Iv and rechecked BP was 175/123. Dr, Sheryle Hail notified if he would like to start a drip. Dr. Sheryle Hail indicated we'll not do any drip at this time. The BP parameter was changed by Dr. Sheryle Hail as indicated on the [facility]. Will give Hydralazine and recheck. Troponin level went up to 0.15, Dr. Sheryle Hail notified without any new order. Patient is chest pain free. Will continue to monitor.

## 2018-06-08 NOTE — Progress Notes (Signed)
*  PRELIMINARY RESULTS* Echocardiogram 2D Echocardiogram has been performed.  Cristela Blue 06/08/2018, 2:55 PM

## 2018-06-08 NOTE — Progress Notes (Addendum)
Patient has been in his room since 0200 and he's not been cardiac monitored. CCMD Luisa Hart ) indicated when he enters the CSN number it says that number was not found. This RN called the bed placement and they indicated there's nothing wrong on their side  Patient is NSR on the box. Dr. Sheryle Hail notified and he indicated since patient's rhythm is seen on the box he's fine with it. Will continue to monitor while trying to find the cause of the problem. Patient woke up yelling for pain medication but would quickly drift off to sleep. Dr. Sheryle Hail notified as well and he refused to give medication due to drowsiness. Will continue to monitor.

## 2018-06-08 NOTE — Progress Notes (Signed)
The patient is admitted to 2A 260 with the diagnosis of hypertensive urgency. Alert and oriented but very drowsy and not able to answer admission questionaire. Patient opened his eyes and talked to the staff during transfer from ED stretcher to our bed.  Patient requested for a pain medicine for a pain level of 9/10 during transfer. Dr. Anne Hahn notified  without any new order due to patient's drowsiness. New order to give Labeterol IV to bring BP down to <160/100 and if not, we will proceed to give a drip to lower it. No acute distress noted. Will continue to monitor.

## 2018-06-08 NOTE — Progress Notes (Signed)
Patient was admitted to the CCMD from our side. Brandon Allen said they couldn't do it from their end for some reasons. Patient is now seen on the big screen.

## 2018-06-08 NOTE — Progress Notes (Signed)
CRITICAL VALUE ALERT  Critical Value:  Troponin 0.27  Date & Time Notied:  06/08/2018 9:05 AM   Provider Notified: Cherlynn Kaiser   Orders Received/Actions taken: MD to come see patient.

## 2018-06-08 NOTE — Progress Notes (Signed)
[facility] - [place] at [facility]      PATIENT NAME: Brandon Allen    MR#:  [id number]  DATE OF BIRTH:  1981/03/24  SUBJECTIVE:   Patient presented to the hospital due to intractable nausea and vomiting and complaining of some abdominal pain and also noted to have significantly elevated blood pressures with hypertensive urgency.  REVIEW OF SYSTEMS:    Review of Systems  Constitutional: Negative for chills and fever.  HENT: Negative for congestion and tinnitus.   Eyes: Negative for blurred vision and double vision.  Respiratory: Negative for cough, shortness of breath and wheezing.   Cardiovascular: Negative for chest pain, orthopnea and PND.  Gastrointestinal: Positive for abdominal pain and nausea. Negative for diarrhea and vomiting.  Genitourinary: Negative for dysuria and hematuria.  Neurological: Negative for dizziness, sensory change and focal weakness.  All other systems reviewed and are negative.   Nutrition: Heart Healthy Tolerating Diet: Yes Tolerating PT: Ambulatory  DRUG ALLERGIES:   Allergies  Allergen Reactions  . Morphine And Related Nausea And Vomiting  . Zofran [Ondansetron Hcl] Nausea And Vomiting    VITALS:  Blood pressure (!) 152/108, pulse 83, temperature 98.8 F (37.1 C), temperature source Oral, resp. rate 19, height 6' (1.829 m), weight 81.6 kg, SpO2 99 %.  PHYSICAL EXAMINATION:   Physical Exam  GENERAL:  37 y.o.-year-old patient lying in bed lethargic but in NAD.  EYES: Pupils equal, round, reactive to light and accommodation. No scleral icterus. Extraocular muscles intact.  HEENT: Head atraumatic, normocephalic. Oropharynx and nasopharynx clear.  NECK:  Supple, no jugular venous distention. No thyroid enlargement, no tenderness.  LUNGS: Normal breath sounds bilaterally, no wheezing, rales, rhonchi. No use of accessory muscles of respiration.  CARDIOVASCULAR: S1, S2 normal. No murmurs, rubs, or gallops.  ABDOMEN: Soft,  nontender, nondistended. Bowel sounds present. No organomegaly or mass.  EXTREMITIES: No cyanosis, clubbing or edema b/l.    NEUROLOGIC: Cranial nerves II through XII are intact. No focal Motor or sensory deficits b/l.   PSYCHIATRIC: The patient is alert and oriented x 3.  SKIN: No obvious rash, lesion, or ulcer.    LABORATORY PANEL:   CBC Recent Labs  Lab 06/08/18 0253  WBC 15.0*  HGB 15.7  HCT 45.9  PLT 257   ------------------------------------------------------------------------------------------------------------------  Chemistries  Recent Labs  Lab 06/07/18 1920 06/08/18 0253  NA 144 142  K 4.1 3.7  CL 110 110  CO2 23 23  GLUCOSE 155* 132*  BUN 7 8  CREATININE 1.01 0.86  CALCIUM 10.6* 9.8  AST 32  --   ALT 18  --   ALKPHOS 66  --   BILITOT 1.2  --    ------------------------------------------------------------------------------------------------------------------  Cardiac Enzymes Recent Labs  Lab 06/08/18 1414  TROPONINI 0.23*   ------------------------------------------------------------------------------------------------------------------  RADIOLOGY:  Ct Head Wo Contrast  Result Date: 06/07/2018 CLINICAL DATA:  Altered LOC EXAM: CT HEAD WITHOUT CONTRAST TECHNIQUE: Contiguous axial images were obtained from the base of the skull through the vertex without intravenous contrast. COMPARISON:  05/26/2010 head CT FINDINGS: Brain: Study is limited by recent administration of intravascular contrast. This limits evaluation for acute intracranial hemorrhage. No mass or large vessel territorial infarct is visualized. The ventricles are of normal size Vascular: Increased vascular density consistent with recent contrast administration. Increased density in the dural venous sinuses. Skull: No fracture Sinuses/Orbits: Mucous retention cyst in the right sphenoid sinus Other: None IMPRESSION: Slightly limited exam secondary to the presence of recently administered  intravenous contrast.  Allowing for this, no definite acute intracranial abnormality Electronically Signed   By: Jasmine Pang M.D.   On: 06/07/2018 21:22   Dg Chest Portable 1 View  Result Date: 06/07/2018 CLINICAL DATA:  Hypertensive crisis.  Abdominal pain. EXAM: PORTABLE CHEST 1 VIEW COMPARISON:  09/28/2016 FINDINGS: The heart size and mediastinal contours are within normal limits. Both lungs are clear. The visualized skeletal structures are unremarkable. IMPRESSION: No active disease. Electronically Signed   By: Paulina Fusi M.D.   On: 06/07/2018 19:49   Ct Angio Chest/abd/pel For Dissection W And/or Wo Contrast  Result Date: 06/07/2018 CLINICAL DATA:  Hypertensive crisis. Chest and abdominal pain. Vomiting. EXAM: CT ANGIOGRAPHY CHEST, ABDOMEN AND PELVIS TECHNIQUE: Multidetector CT imaging through the chest, abdomen and pelvis was performed using the standard protocol during bolus administration of intravenous contrast. Multiplanar reconstructed images and MIPs were obtained and reviewed to evaluate the vascular anatomy. CONTRAST:  ISOVUE-370 IOPAMIDOL (ISOVUE-370) INJECTION 76% COMPARISON:  Chest radiography same day. Multiple previous CT examinations most recently 06/07/2017 FINDINGS: CTA CHEST FINDINGS Cardiovascular: Heart size is normal. No pericardial fluid. The aorta is normal. No aneurysm or dissection. No atherosclerotic change. No coronary artery calcification is visible. Pulmonary artery branches appear normal. Mediastinum/Nodes: Normal Lungs/Pleura: Normal Musculoskeletal: Normal Review of the MIP images confirms the above findings. CTA ABDOMEN AND PELVIS FINDINGS VASCULAR Aorta: Normal.  No dissection.  No atherosclerosis. Celiac: Normal SMA: Normal Renals: Single on each side.  Normal. IMA: Normal Inflow: Normal Veins: Normal Review of the MIP images confirms the above findings. NON-VASCULAR Hepatobiliary: Normal Pancreas: Normal Spleen: Normal Adrenals/Urinary Tract: Normal  Stomach/Bowel: Normal Lymphatic: No adenopathy. Reproductive: Normal Other: No free fluid or air. Musculoskeletal: Normal except for a Schmorl's node within the L5 vertebral body. Review of the MIP images confirms the above findings. IMPRESSION: No vascular abnormality. No aortic dissection. No evidence of atherosclerotic disease. Lungs are clear.  No abdominal or pelvic organ abnormality. No cause of the presenting symptoms is identified. Electronically Signed   By: Paulina Fusi M.D.   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  1.  Hypertensive urgency-secondary to medical noncompliance.  Patient apparently was not taking his medications prior to admission. - Presently having significant nausea vomiting.  Clonidine patch was added, will add some Nitropaste and also Vasotec IV scheduled.  Once his nausea vomiting improves can resume his oral meds.  2.  Abdominal pain/nausea secondary to cyclical vomiting syndrome.  Would avoid narcotic's -Continue supportive care with IV fluids, IV Pepcid.   -I will place some IV Toradol for pain.  3.  Elevated troponin-likely in the setting of supply demand ischemia from significant hypertension. - Cycle troponins, keep on telemetry, will check echocardiogram.     All the records are reviewed and case discussed with Care Management/Social Worker. Management plans discussed with the patient, family and they are in agreement.  CODE STATUS: Full code  DVT Prophylaxis: Lovenox  TOTAL TIME TAKING CARE OF THIS PATIENT: 35 minutes.   POSSIBLE D/C IN 2-3 DAYS, DEPENDING ON CLINICAL CONDITION.   Houston Siren M.D on 06/08/2018 at 4:48 PM  Between 7am to 6pm - Pager - [phone]  After 6pm go to  [email or web address] - Therapist, nutritional Hospitalists  Office  [phone]  CC: Primary care physician; Patient, No Pcp Per

## 2018-06-09 LAB — BASIC METABOLIC PANEL
Anion gap: 6 (ref 5–15)
BUN: 12 mg/dL (ref 6–20)
CHLORIDE: 109 mmol/L (ref 98–111)
CO2: 25 mmol/L (ref 22–32)
CREATININE: 1.05 mg/dL (ref 0.61–1.24)
Calcium: 9 mg/dL (ref 8.9–10.3)
Glucose, Bld: 108 mg/dL — ABNORMAL HIGH (ref 70–99)
Potassium: 3.6 mmol/L (ref 3.5–5.1)
SODIUM: 140 mmol/L (ref 135–145)

## 2018-06-09 LAB — HIV ANTIBODY (ROUTINE TESTING W REFLEX): HIV Screen 4th Generation wRfx: NONREACTIVE

## 2018-06-09 MED ORDER — METOPROLOL TARTRATE 25 MG PO TABS: 12.5000 mg | ORAL_TABLET | Freq: Two times a day (BID) | ORAL | 1 refills | Status: AC

## 2018-06-09 MED ORDER — CLONIDINE HCL 0.3 MG PO TABS: 0.3000 mg | ORAL_TABLET | Freq: Two times a day (BID) | ORAL | 1 refills | Status: AC

## 2018-06-09 MED ORDER — CLONIDINE HCL 0.1 MG PO TABS: 0.3000 mg | ORAL_TABLET | Freq: Two times a day (BID) | ORAL | Status: DC

## 2018-06-09 MED ORDER — METOPROLOL TARTRATE 25 MG PO TABS: 12.5000 mg | ORAL_TABLET | Freq: Two times a day (BID) | ORAL | Status: DC

## 2018-06-09 MED ORDER — METOPROLOL TARTRATE 25 MG PO TABS: 25.0000 mg | ORAL_TABLET | Freq: Two times a day (BID) | ORAL | Status: DC

## 2018-06-09 NOTE — Plan of Care (Signed)

## 2018-06-09 NOTE — Discharge Summary (Signed)
[facility] - [place] at [facility]   PATIENT NAME: Brandon Allen    MR#:  [id number]  DATE OF BIRTH:  Dec 25, 1980  DATE OF ADMISSION:  06/07/2018 ADMITTING PHYSICIAN: Oralia Manis, MD  DATE OF DISCHARGE: 06/09/2018 11:24 AM  PRIMARY CARE PHYSICIAN: [facility]    ADMISSION DIAGNOSIS:  Hypertensive urgency [I16.0] Intractable nausea and vomiting [R11.2]  DISCHARGE DIAGNOSIS:  Principal Problem:   Hypertensive urgency Active Problems:   Cyclic vomiting syndrome   GERD (gastroesophageal reflux disease)   SECONDARY DIAGNOSIS:   Past Medical History:  Diagnosis Date  . Atrial fibrillation (HCC)    Dr Mariah Milling  . 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 Deafness in right ear   . HTN (hypertension) 12/20/2014  . Kidney stones   . 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  1.  Hypertensive urgency-secondary to medical noncompliance.  Patient apparently was not taking his medications prior to admission. -Patient was admitted to the hospital and placed on IV labetalol, IV hydralazine.  Patient was then started on a clonidine patch also given some Nitropaste and IV Vasotec.  Patient's blood pressures have significantly improved now.  His nausea vomiting has resolved and therefore he has been started back on his oral clonidine and metoprolol which is why he is being discharged on.  He should follow-up at the [facility] in the next 1 to 2 weeks.  2.  Abdominal pain/nausea secondary to cyclical vomiting syndrome/dyspepsia -Patient was treated supportively with IV Toradol, Pepcid and fluids.  His abdominal pain and nausea has now resolved and he is tolerating p.o. well.  3.  Elevated  troponin-likely in the setting of supply demand ischemia from significant hypertension. -Patient's troponins did not trend significantly upwards.  He had an echocardiogram done which showed normal ejection fraction with no wall motion abnormalities.  DISCHARGE CONDITIONS:   Stable  CONSULTS OBTAINED:    DRUG ALLERGIES:   Allergies  Allergen Reactions  . Morphine And Related Nausea And Vomiting  . Zofran [Ondansetron Hcl] Nausea And Vomiting    DISCHARGE MEDICATIONS:   Allergies as of 06/09/2018      Reactions   Morphine And Related Nausea And Vomiting   Zofran [ondansetron Hcl] Nausea And Vomiting      Medication List    STOP taking these medications   cloNIDine 0.3 mg/24hr patch Commonly known as:  CATAPRES - Dosed in mg/24 hr   ibuprofen 600 MG tablet Commonly known as:  ADVIL,MOTRIN   nicotine 14 mg/24hr patch Commonly known as:  NICODERM CQ - dosed in mg/24 hours   pantoprazole 40 MG tablet Commonly known as:  PROTONIX   promethazine 12.5 MG tablet Commonly known as:  PHENERGAN     TAKE these medications   acetaminophen 325 MG tablet Commonly known as:  TYLENOL Take 2 tablets (650 mg total) by mouth every 6 (six) hours as needed for mild pain.   cloNIDine 0.3 MG tablet Commonly known as:  CATAPRES Take 1 tablet (0.3 mg total) by mouth 2 (two) times daily.   metoprolol tartrate 25 MG tablet Commonly known as:  LOPRESSOR Take 0.5 tablets (12.5 mg total) by mouth 2 (two) times daily.         DISCHARGE INSTRUCTIONS:  DIET:  Cardiac diet  DISCHARGE CONDITION:  Stable  ACTIVITY:  Activity as tolerated  OXYGEN:  Home Oxygen: No.   Oxygen Delivery: room air  DISCHARGE LOCATION:  home   If you experience worsening of your admission symptoms, develop shortness of breath, life threatening emergency, suicidal or homicidal thoughts you must seek medical attention immediately by calling 911 or calling your MD immediately  if symptoms less  severe.  You Must read complete instructions/literature along with all the possible adverse reactions/side effects for all the Medicines you take and that have been prescribed to you. Take any new Medicines after you have completely understood and accpet all the possible adverse reactions/side effects.   Please note  You were cared for by a hospitalist during your hospital stay. If you have any questions about your discharge medications or the care you received while you were in the hospital after you are discharged, you can call the unit and asked to speak with the hospitalist on call if the hospitalist that took care of you is not available. Once you are discharged, your primary care physician will handle any further medical issues. Please note that NO REFILLS for any discharge medications will be authorized once you are discharged, as it is imperative that you return to your primary care physician (or establish a relationship with a primary care physician if you do not have one) for your aftercare needs so that they can reassess your need for medications and monitor your lab values.     Today   No acute events overnight.  Abdominal pain resolved, patient denies any chest pain or shortness of breath.  Blood pressure significantly improved.  Will discharge home later today.  VITAL SIGNS:  Blood pressure 109/69, pulse 78, temperature 99.3 F (37.4 C), temperature source Oral, resp. rate 17, height 6' (1.829 m), weight 81.6 kg, SpO2 100 %.  I/O:    Intake/Output Summary (Last 24 hours) at 06/09/2018 1537 Last data filed at 06/09/2018 0200 Gross per 24 hour  Intake 518 ml  Output 375 ml  Net 143 ml    PHYSICAL EXAMINATION:  GENERAL:  37 y.o.-year-old patient lying in the bed with no acute distress.  EYES: Pupils equal, round, reactive to light and accommodation. No scleral icterus. Extraocular muscles intact.  HEENT: Head atraumatic, normocephalic. Oropharynx and nasopharynx clear.   NECK:  Supple, no jugular venous distention. No thyroid enlargement, no tenderness.  LUNGS: Normal breath sounds bilaterally, no wheezing, rales,rhonchi. No use of accessory muscles of respiration.  CARDIOVASCULAR: S1, S2 normal. No murmurs, rubs, or gallops.  ABDOMEN: Soft, non-tender, non-distended. Bowel sounds present. No organomegaly or mass.  EXTREMITIES: No pedal edema, cyanosis, or clubbing.  NEUROLOGIC: Cranial nerves II through XII are intact. No focal motor or sensory defecits b/l.  PSYCHIATRIC: The patient is alert and oriented x 3. Good affect.  SKIN: No obvious rash, lesion, or ulcer.   DATA REVIEW:   CBC Recent Labs  Lab 06/08/18 0253  WBC 15.0*  HGB 15.7  HCT 45.9  PLT 257    Chemistries  Recent Labs  Lab 06/07/18 1920  06/09/18 0616  NA 144   < > 140  K 4.1   < > 3.6  CL 110   < > 109  CO2 23   < > 25  GLUCOSE 155*   < > 108*  BUN 7   < > 12  CREATININE 1.01   < > 1.05  CALCIUM 10.6*   < > 9.0  AST 32  --   --   ALT 18  --   --   ALKPHOS 66  --   --   BILITOT 1.2  --   --    < > = values in this interval not displayed.    Cardiac Enzymes Recent Labs  Lab 06/08/18 1414  TROPONINI 0.23*    Microbiology Results  No results found for this or any previous visit.  RADIOLOGY:  Ct Head Wo Contrast  Result Date: 06/07/2018 CLINICAL DATA:  Altered LOC EXAM: CT HEAD WITHOUT CONTRAST TECHNIQUE: Contiguous axial images were obtained from the base of the skull through the vertex without intravenous contrast. COMPARISON:  05/26/2010 head CT FINDINGS: Brain: Study is limited by recent administration of intravascular contrast. This limits evaluation for acute intracranial hemorrhage. No mass or large vessel territorial infarct is visualized. The ventricles are of normal size Vascular: Increased vascular density consistent with recent contrast administration. Increased density in the dural venous sinuses. Skull: No fracture Sinuses/Orbits: Mucous retention cyst  in the right sphenoid sinus Other: None IMPRESSION: Slightly limited exam secondary to the presence of recently administered intravenous contrast. Allowing for this, no definite acute intracranial abnormality Electronically Signed   By: Jasmine Pang M.D.   On: 06/07/2018 21:22   Dg Chest Portable 1 View  Result Date: 06/07/2018 CLINICAL DATA:  Hypertensive crisis.  Abdominal pain. EXAM: PORTABLE CHEST 1 VIEW COMPARISON:  09/28/2016 FINDINGS: The heart size and mediastinal contours are within normal limits. Both lungs are clear. The visualized skeletal structures are unremarkable. IMPRESSION: No active disease. Electronically Signed   By: Paulina Fusi M.D.   On: 06/07/2018 19:49   Ct Angio Chest/abd/pel For Dissection W And/or Wo Contrast  Result Date: 06/07/2018 CLINICAL DATA:  Hypertensive crisis. Chest and abdominal pain. Vomiting. EXAM: CT ANGIOGRAPHY CHEST, ABDOMEN AND PELVIS TECHNIQUE: Multidetector CT imaging through the chest, abdomen and pelvis was performed using the standard protocol during bolus administration of intravenous contrast. Multiplanar reconstructed images and MIPs were obtained and reviewed to evaluate the vascular anatomy. CONTRAST:  ISOVUE-370 IOPAMIDOL (ISOVUE-370) INJECTION 76% COMPARISON:  Chest radiography same day. Multiple previous CT examinations most recently 06/07/2017 FINDINGS: CTA CHEST FINDINGS Cardiovascular: Heart size is normal. No pericardial fluid. The aorta is normal. No aneurysm or dissection. No atherosclerotic change. No coronary artery calcification is visible. Pulmonary artery branches appear normal. Mediastinum/Nodes: Normal Lungs/Pleura: Normal Musculoskeletal: Normal Review of the MIP images confirms the above findings. CTA ABDOMEN AND PELVIS FINDINGS VASCULAR Aorta: Normal.  No dissection.  No atherosclerosis. Celiac: Normal SMA: Normal Renals: Single on each side.  Normal. IMA: Normal Inflow: Normal Veins: Normal Review of the MIP images confirms  the above findings. NON-VASCULAR Hepatobiliary: Normal Pancreas: Normal Spleen: Normal Adrenals/Urinary Tract: Normal Stomach/Bowel: Normal Lymphatic: No adenopathy. Reproductive: Normal Other: No free fluid or air. Musculoskeletal: Normal except for a Schmorl's node within the L5 vertebral body. Review of the MIP images confirms the above findings. IMPRESSION: No vascular abnormality. No aortic dissection. No evidence of atherosclerotic disease. Lungs are clear.  No abdominal or pelvic organ abnormality. No cause of the presenting symptoms is identified. Electronically Signed   By: Paulina Fusi M.D.   On: 06/07/2018 20:52      Management plans discussed with the patient, family and they are in agreement.  CODE STATUS:  Code Status History    Date Active Date Inactive Code Status Order ID Comments User Context   06/08/2018 0159 06/09/2018 1429 Full Code [id number]  Anne Hahn,  Onalee Hua, MD ED    TOTAL TIME TAKING CARE OF THIS PATIENT: 40 minutes.    Houston Siren M.D on 06/09/2018 at 3:37 PM  Between 7am to 6pm - Pager - [phone]  After 6pm go to [email or web address] - [facility], [facility]  Office  [phone]  CC: Primary care physician; [facility]

## 2018-06-09 NOTE — Progress Notes (Signed)
[facility]    Brandon Allen was admitted to the Hospital on 06/07/2018 and Discharged  06/09/2018 and should be excused from work/school   for 5 days starting 06/07/2018 , may return to work/school without any restrictions.  Call Hilda Lias MD, [facility]  [phone] with questions.  Houston Siren M.D on 06/09/2018,at 11:54 AM

## 2018-06-09 NOTE — Progress Notes (Signed)
Patient pain free, BP improved -- Eager to be discharged.

## 2018-06-09 NOTE — Progress Notes (Signed)
Patient alert and oriented, nsr, on room air, no complaints of pain.  BP under better control.  PAtient discharged on BP medications and given instructions on how to take them.  Able to repeat back information.  Appt [facility].  D/C telemetry and PIV. Escorted out of hospital via wheelchair by volunteers.

## 2018-11-19 ENCOUNTER — Emergency Department
Admission: EM | Admit: 2018-11-19 | Discharge: 2018-11-19 | Disposition: A | Discharge status: Home / Self Care | Payer: Self-pay | Attending: Emergency Medicine | Admitting: Emergency Medicine

## 2018-11-19 ENCOUNTER — Other Ambulatory Visit: Payer: Self-pay

## 2018-11-19 ENCOUNTER — Emergency Department: Payer: Self-pay

## 2018-11-19 DIAGNOSIS — Z79899 Other long term (current) drug therapy: Secondary | ICD-10-CM | POA: Insufficient documentation

## 2018-11-19 DIAGNOSIS — F17210 Nicotine dependence, cigarettes, uncomplicated: Secondary | ICD-10-CM | POA: Insufficient documentation

## 2018-11-19 DIAGNOSIS — R112 Nausea with vomiting, unspecified: Secondary | ICD-10-CM | POA: Insufficient documentation

## 2018-11-19 DIAGNOSIS — R1084 Generalized abdominal pain: Secondary | ICD-10-CM | POA: Insufficient documentation

## 2018-11-19 DIAGNOSIS — I10 Essential (primary) hypertension: Secondary | ICD-10-CM | POA: Insufficient documentation

## 2018-11-19 LAB — CBC
HCT: 48.7 % (ref 39.0–52.0)
Hemoglobin: 16.5 g/dL (ref 13.0–17.0)
MCH: 31.3 pg (ref 26.0–34.0)
MCHC: 33.9 g/dL (ref 30.0–36.0)
MCV: 92.4 fL (ref 80.0–100.0)
Platelets: 243 10*3/uL (ref 150–400)
RBC: 5.27 MIL/uL (ref 4.22–5.81)
RDW: 11.6 % (ref 11.5–15.5)
WBC: 15.5 10*3/uL — ABNORMAL HIGH (ref 4.0–10.5)
nRBC: 0 % (ref 0.0–0.2)

## 2018-11-19 LAB — COMPREHENSIVE METABOLIC PANEL
ALT: 20 U/L (ref 0–44)
AST: 25 U/L (ref 15–41)
Albumin: 5.2 g/dL — ABNORMAL HIGH (ref 3.5–5.0)
Alkaline Phosphatase: 56 U/L (ref 38–126)
Anion gap: 11 (ref 5–15)
BUN: 17 mg/dL (ref 6–20)
CO2: 23 mmol/L (ref 22–32)
Calcium: 10.1 mg/dL (ref 8.9–10.3)
Chloride: 107 mmol/L (ref 98–111)
Creatinine, Ser: 1.05 mg/dL (ref 0.61–1.24)
GFR calc Af Amer: >60 mL/min (ref >60)
GFR calc non Af Amer: >60 mL/min (ref >60)
Glucose, Bld: 120 mg/dL — ABNORMAL HIGH (ref 70–99)
Potassium: 3.3 mmol/L — ABNORMAL LOW (ref 3.5–5.1)
Sodium: 141 mmol/L (ref 135–145)
Total Bilirubin: 0.9 mg/dL (ref 0.3–1.2)
Total Protein: 8.8 g/dL — ABNORMAL HIGH (ref 6.5–8.1)

## 2018-11-19 LAB — TROPONIN I: Troponin I: <0.03 ng/mL (ref <0.03)

## 2018-11-19 LAB — LIPASE, BLOOD: Lipase: 50 U/L (ref 11–51)

## 2018-11-19 LAB — MAGNESIUM: Magnesium: 2.1 mg/dL (ref 1.7–2.4)

## 2018-11-19 MED ORDER — DIPHENHYDRAMINE HCL 50 MG/ML IJ SOLN
INTRAMUSCULAR | Status: AC
  Administered 2018-11-19: 25 mg via INTRAVENOUS
  Filled 2018-11-19: qty 1

## 2018-11-19 MED ORDER — SODIUM CHLORIDE 0.9 % IV BOLUS
1000.0000 mL | Freq: Once | INTRAVENOUS | Status: AC
  Administered 2018-11-19: 05:00:00 1000 mL via INTRAVENOUS

## 2018-11-19 MED ORDER — DIPHENHYDRAMINE HCL 50 MG/ML IJ SOLN
25.0000 mg | INTRAMUSCULAR | Status: AC
  Administered 2018-11-19: 05:00:00 25 mg via INTRAVENOUS

## 2018-11-19 MED ORDER — PROMETHAZINE HCL 25 MG PO TABS: 25.0000 mg | ORAL_TABLET | Freq: Four times a day (QID) | ORAL | 0 refills | Status: DC | PRN

## 2018-11-19 MED ORDER — HALOPERIDOL LACTATE 5 MG/ML IJ SOLN
5.0000 mg | Freq: Once | INTRAMUSCULAR | Status: AC
  Administered 2018-11-19: 05:00:00 5 mg via INTRAVENOUS

## 2018-11-19 MED ORDER — DIPHENHYDRAMINE HCL 50 MG/ML IJ SOLN: 12.5000 mg | INTRAMUSCULAR | Status: DC

## 2018-11-19 MED ORDER — HALOPERIDOL LACTATE 5 MG/ML IJ SOLN
INTRAMUSCULAR | Status: AC
  Administered 2018-11-19: 05:00:00 5 mg via INTRAVENOUS
  Filled 2018-11-19: qty 1

## 2018-11-19 MED ORDER — ONDANSETRON HCL 4 MG/2ML IJ SOLN
INTRAMUSCULAR | Status: AC
  Filled 2018-11-19: qty 2

## 2018-11-19 MED ORDER — KETOROLAC TROMETHAMINE 30 MG/ML IJ SOLN
30.0000 mg | Freq: Once | INTRAMUSCULAR | Status: AC
  Administered 2018-11-19: 30 mg via INTRAVENOUS
  Filled 2018-11-19: qty 1

## 2018-11-19 MED ORDER — IOHEXOL 300 MG/ML  SOLN
100.0000 mL | Freq: Once | INTRAMUSCULAR | Status: AC | PRN
  Administered 2018-11-19: 100 mL via INTRAVENOUS

## 2018-11-19 MED ORDER — METOCLOPRAMIDE HCL 5 MG/ML IJ SOLN
10.0000 mg | INTRAMUSCULAR | Status: AC
  Administered 2018-11-19: 09:00:00 10 mg via INTRAVENOUS
  Filled 2018-11-19 (×2): qty 2

## 2018-11-19 MED ORDER — LORAZEPAM 2 MG/ML IJ SOLN
1.0000 mg | Freq: Once | INTRAMUSCULAR | Status: AC
  Administered 2018-11-19: 1 mg via INTRAVENOUS
  Filled 2018-11-19: qty 1

## 2018-11-19 NOTE — ED Provider Notes (Signed)
[facility] Emergency Department Provider Note  ____________________________________________   First MD Initiated Contact with Patient 11/19/18 0425     (approximate)  I have reviewed the triage vital signs and the nursing notes.   HISTORY  Chief Complaint Emesis    HPI Brandon Allen is a 38 y.o. male with medical history as listed below who presents for evaluation of acute onset and severe nausea, vomiting, abdominal pain, and diarrhea.  He states that he had a normal day yesterday at his job.  Before he went to bed he says that his stomach was starting to hurt but it was mild.  He woke up little over an hour ago with severe pain and has had repeated episodes of vomiting.  He says this is different from prior episodes of cyclic vomiting syndrome (versus cannabinoid hyperemesis syndrome) because usually he does not have diarrhea.  He is pale and diaphoretic and reports the pain is severe as well as the nausea.  Nothing particular makes it better and moving around makes it worse.  He says he still is smoking "a little bit of weed" but that it is not every day.  He denies eating anything unusual or concerning earlier today.  He denies fever/chills, chest pain, shortness of breath, cough, sore throat, and he has not been around anyone with COVID-19.  He claims that he is compliant with his blood pressure medicine including taking both his clonidine and metoprolol yesterday.         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 ear    HTN (hypertension) 12/20/2014   Kidney stones    NSAID induced gastritis     Patient Active Problem List   Diagnosis Date Noted   Nausea and vomiting 03/08/2018   Intractable nausea and vomiting 06/07/2017   Opiate abuse, episodic (HCC) 06/07/2017   Tobacco abuse 04/06/2017   Nausea & vomiting 03/21/2017    Hypertensive urgency 03/21/2017   GERD (gastroesophageal reflux disease) 11/24/2016   Leukocytosis 01/26/2016   Hyperglycemia 01/26/2016   Essential hypertension 01/26/2016   Cannabis abuse 01/26/2016   Cyclic vomiting syndrome 01/25/2016   Atrial fibrillation with rapid ventricular response (HCC) 12/20/2014   HTN (hypertension) 12/20/2014    Past Surgical History:  Procedure Laterality Date   CHOLECYSTECTOMY     ESOPHAGOGASTRODUODENOSCOPY     ESOPHAGOGASTRODUODENOSCOPY  11/2011   NSAID-induced gastritis (Dr Bluford Kaufmann)   ESOPHAGOGASTRODUODENOSCOPY N/A 06/24/2016   Procedure: ESOPHAGOGASTRODUODENOSCOPY (EGD);  Surgeon: Scot Jun, MD;  Location: [facility] ENDOSCOPY;  Service: Endoscopy;  Laterality: N/A;   INNER EAR SURGERY      Prior to Admission medications   Medication Sig Start Date End Date Taking? Authorizing Provider  acetaminophen (TYLENOL) 325 MG tablet Take 2 tablets (650 mg total) by mouth every 6 (six) hours as needed for mild pain. 03/10/18   Ramonita Lab, MD  cloNIDine (CATAPRES) 0.3 MG tablet Take 1 tablet (0.3 mg total) by mouth 2 (two) times daily. 06/09/18 08/08/18  Houston Siren, MD  metoprolol tartrate (LOPRESSOR) 25 MG tablet Take 0.5 tablets (12.5 mg total) by mouth 2 (two) times daily. 06/09/18 08/08/18  Houston Siren, MD    Allergies Morphine and related and Zofran [ondansetron hcl]  Family History  Problem Relation Age of Onset   Heart failure Other    Colon cancer Maternal Grandfather 18  Hypertension Paternal Grandfather     Social History Social History   Tobacco Use   Smoking status: Current Every Day Smoker    Packs/day: 0.50    Types: Cigarettes    Start date: 12/19/2009   Smokeless tobacco: Never Used  Substance Use Topics   Alcohol use: No    Alcohol/week: 0.0 standard drinks   Drug use: Yes    Types: Marijuana    Comment: every other day    Review of Systems Constitutional: No fever/chills Eyes: No visual  changes. ENT: No sore throat. Cardiovascular: Denies chest pain. Respiratory: Denies shortness of breath. Gastrointestinal: Abdominal pain, nausea, vomiting, and diarrhea, all as described above. Genitourinary: Negative for dysuria. Musculoskeletal: Negative for neck pain.  Negative for back pain. Integumentary: Negative for rash. Neurological: Negative for headaches, focal weakness or numbness.   ____________________________________________   PHYSICAL EXAM:  VITAL SIGNS: ED Triage Vitals [11/19/18 0414]  Enc Vitals Group     BP (!) 168/113     Pulse Rate 92     Resp (!) 22     Temp 98.5 F (36.9 C)     Temp Source Axillary     SpO2 98 %     Weight 86.2 kg (190 lb)     Height 1.829 m (6')     Head Circumference      Peak Flow      Pain Score      Pain Loc      Pain Edu?      Excl. in GC?     Constitutional: Alert and oriented.  Moderate distress, ill-appearing, pale and diaphoretic. Eyes: Conjunctivae are normal.  Head: Atraumatic. Nose: No congestion/rhinnorhea. Mouth/Throat: Mucous membranes are moist. Neck: No stridor.  No meningeal signs.   Cardiovascular: Normal rate, regular rhythm. Good peripheral circulation. Grossly normal heart sounds. Respiratory: Normal respiratory effort.  No retractions. No audible wheezing. Gastrointestinal: Thin body habitus.  Nondistended abdomen.  Soft with diffuse severe tenderness all throughout the abdomen but without any focal tenderness.  Guarding before I even touch his abdomen for the exam. Musculoskeletal: No lower extremity tenderness nor edema. No gross deformities of extremities. Neurologic:  Normal speech and language. No gross focal neurologic deficits are appreciated.  Skin:  Skin is highly diaphoretic but warm.  Pale.   ____________________________________________   LABS (all labs ordered are listed, but only abnormal results are displayed)  Labs Reviewed  COMPREHENSIVE METABOLIC PANEL - Abnormal; Notable for  the following components:      Result Value   Potassium 3.3 (*)    Glucose, Bld 120 (*)    Total Protein 8.8 (*)    Albumin 5.2 (*)    All other components within normal limits  CBC - Abnormal; Notable for the following components:   WBC 15.5 (*)    All other components within normal limits  LIPASE, BLOOD  MAGNESIUM  TROPONIN I  URINALYSIS, COMPLETE (UACMP) WITH MICROSCOPIC  URINE DRUG SCREEN, QUALITATIVE ([facility] ONLY)   ____________________________________________  EKG  ED ECG REPORT I, Loleta Roseory Klayten Jolliff, the attending physician, personally viewed and interpreted this ECG.  Date: 11/19/2018 EKG Time: 4:24 AM Rate: 72 Rhythm: normal sinus rhythm QRS Axis: normal Intervals: normal with some ventricular hypertrophy. ST/T Wave abnormalities: normal Narrative Interpretation: no evidence of acute ischemia  ____________________________________________  RADIOLOGY   ED MD interpretation:  No acute abnormalities on CT scan.  Official radiology report(s): Ct Abdomen Pelvis W Contrast  Result Date: 11/19/2018 CLINICAL DATA:  Nausea and vomiting.  Actively vomiting in triage. Diaphoretic. EXAM: CT ABDOMEN AND PELVIS WITH CONTRAST TECHNIQUE: Multidetector CT imaging of the abdomen and pelvis was performed using the standard protocol following bolus administration of intravenous contrast. CONTRAST:  OMNIPAQUE IOHEXOL 300 MG/ML  SOLN COMPARISON:  CT 06/07/2017 FINDINGS: Lower chest: Lung bases are clear. Hepatobiliary: No focal hepatic lesion. Postcholecystectomy. No biliary dilatation. Pancreas: Pancreas is normal. No ductal dilatation. No pancreatic inflammation. Spleen: Normal spleen Adrenals/urinary tract: Adrenal glands and kidneys are normal. The ureters and bladder normal. Stomach/Bowel: Stomach, small bowel, appendix, and cecum are normal. The colon and rectosigmoid colon are normal. Vascular/Lymphatic: Abdominal aorta is normal caliber. No periportal or retroperitoneal adenopathy.  No pelvic adenopathy. Reproductive: Prostate normal Other: No free fluid. Musculoskeletal: No aggressive osseous lesion. IMPRESSION: 1. No acute abdominopelvic findings. 2. Normal appendix. 3. Postcholecystectomy. Electronically Signed   By: Genevive Bi M.D.   On: 11/19/2018 06:24    ____________________________________________   PROCEDURES   Procedure(s) performed (including Critical Care):  Procedures   ____________________________________________   INITIAL IMPRESSION / MDM / ASSESSMENT AND PLAN / ED COURSE  As part of my medical decision making, I reviewed the following data within the electronic MEDICAL RECORD NUMBER Nursing notes reviewed and incorporated, Labs reviewed , Old chart reviewed, Patient signed out to Dr. Darnelle Catalan, Notes from prior ED visits and [place] Controlled Substance Database  Brandon Allen was evaluated in Emergency Department on 11/19/2018 for the symptoms described in the history of present illness. He was evaluated in the context of the global COVID-19 pandemic, which necessitated consideration that the patient might be at risk for infection with the SARS-CoV-2 virus that causes COVID-19. Institutional protocols and algorithms that pertain to the evaluation of patients at risk for COVID-19 are in a state of rapid change based on information released by regulatory bodies including the CDC and federal and state organizations. These policies and algorithms were followed during the patient's care in the ED.      Differential diagnosis includes, but is not limited to, cyclic vomiting syndrome, cannabinoid hyperemesis syndrome, gastroparesis, food poisoning, nonspecific viral gastroenteritis, acute intra-abdominal bacterial infection, SBO/ileus.  The patient appears very uncomfortable and is pale and diaphoretic.  He has a well-documented history of cyclic vomiting syndrome versus cannabinoid hyperemesis syndrome although he claims he only smokes marijuana occasionally in small  amounts but his presentation is consistent with cannabinoid hyperemesis syndrome or a nonspecific vomiting syndrome.  I am treating him with haloperidol 5 mg IV, Benadryl 25 mg IV, and 1 L normal saline.  All follow-up with nonnarcotics as much as possible such as Toradol and possibly a dose of Reglan.  Given the acute onset and amount of pain he is endorsing I will check a CT scan of his abdomen and pelvis with IV contrast only once his labs are back.  Pancreatitis is also possible although I think less likely.  Lab work is pending including a urine drug screen and a troponin.  I looked back through his medical record and he has had multiple admissions in the past for hypertensive urgency and intractable nausea and vomiting.  During the last 1 he had a significantly elevated troponin which was thought secondary to his hypertensive urgency.  He is hypertensive tonight although it is soft to say whether this is the result of poorly controlled hypertension or his acute symptoms.  I will continue to monitor as I treat his GI symptoms.  Clinical Course as of Nov 18 708  Sat Nov 19, 2018  0458 WBC(!): 15.5 [CF]  0537 Patient's PIV somehow came out.  Nurses attempting to re-establish IV access.  Patient reports feeling no better.  Ordered Toradol 30 mg IV and Ativan 2 mg IV.  Will continue to try and avoid narcotics as per UpToDate recommendations for cyclic vomiting syndrome; ketorolac and lorazepam are both recommended so will give them a try for symptomatic relief.     [CF]  [phone] Lab work is reassuring with a slightly decreased potassium, normal creatinine, normal anion gap.  If patient needs more fluids I will consider D5NS or D5 1/2 NS.   [CF]  0547 Troponin I: <0.03 [CF]  4076 Normal CT scan of the abd/pelvis.  Patient still vomiting in spite of extensive medical therapy. Will try Reglan 10 mg IV.  If unsuccessful, the patient may require admission for intractable N/V  CT ABDOMEN PELVIS W CONTRAST [CF]    0706 Transferring ED care to Dr. Darnelle Catalan to reassess after medication and determine if patient will be able to go home or will require admission.   [CF]    Clinical Course User Index [CF] Loleta Rose, MD    ____________________________________________  FINAL CLINICAL IMPRESSION(S) / ED DIAGNOSES  Final diagnoses:  Intractable vomiting with nausea, unspecified vomiting type  Generalized abdominal pain  Essential hypertension     MEDICATIONS GIVEN DURING THIS VISIT:  Medications  metoCLOPramide (REGLAN) injection 10 mg (has no administration in time range)  haloperidol lactate (HALDOL) injection 5 mg (5 mg Intravenous Given 11/19/18 0430)  sodium chloride 0.9 % bolus 1,000 mL (0 mLs Intravenous Stopped 11/19/18 0600)  diphenhydrAMINE (BENADRYL) injection 25 mg (25 mg Intravenous Given 11/19/18 0430)  iohexol (OMNIPAQUE) 300 MG/ML solution 100 mL (100 mLs Intravenous Contrast Given 11/19/18 0526)  ketorolac (TORADOL) 30 MG/ML injection 30 mg (30 mg Intravenous Given 11/19/18 0606)  LORazepam (ATIVAN) injection 1 mg (1 mg Intravenous Given 11/19/18 0607)     ED Discharge Orders    None       Note:  This document was prepared using Dragon voice recognition software and may include unintentional dictation errors.   Loleta Rose, MD 11/19/18 [phone]

## 2018-11-19 NOTE — ED Triage Notes (Signed)
Patient c/o N/V beginning at 0300. Patient actively vomiting in triage. Patient c/o generalized abdominal pain. Patient extremely diaphoretic.

## 2018-11-19 NOTE — ED Provider Notes (Signed)
-----------------------------------------   8:58 AM on 11/19/2018 -----------------------------------------  Patient had been sleeping up till now.  He woke up and began vomiting.  Therefore he got the Reglan just now.  However he does not want to stay.  He says he wants Phenergan and since we are not giving him any Phenergan he wants to go home.  He is insisting on going home.  I will let him go home.  I have already written him a prescription for Phenergan to go home with.  I have asked him in writing to return if he is worse or if he cannot keep down fluids.   Arnaldo Natal, MD 11/19/18 [phone]

## 2018-11-19 NOTE — ED Notes (Signed)
When giving IV medication pt stated "I want to go home". MD informed.

## 2018-11-19 NOTE — Discharge Instructions (Addendum)
Use the Phenergan 1 or even 2 pills 4 times a day as needed for nausea and vomiting.  Return here if you are unable to keep down any fluids.  I would start by sipping small amounts of clear liquid frequently.  This is just a teaspoon or 2 at a time.  Clear liquids include flat soda, weak tea, fruit juice that is not citrus, chicken broth and Jell-O.  If you can keep down a little bit of fluid for a few hours then I would start trying the brat diet bananas rice applesauce toast and crackers.  Nibble on that for 3 or 4 hours and then resume your regular food.  Please note that this cyclic vomiting is frequently caused by too much marijuana.  If that is the case for you I would avoid marijuana.

## 2018-11-19 NOTE — ED Notes (Signed)
EDP at bedside. Pt calling for ride.

## 2019-04-25 ENCOUNTER — Telehealth: Payer: Self-pay

## 2019-04-25 ENCOUNTER — Other Ambulatory Visit: Payer: Self-pay

## 2019-04-25 DIAGNOSIS — Z20822 Contact with and (suspected) exposure to covid-19: Secondary | ICD-10-CM

## 2019-04-25 NOTE — Telephone Encounter (Signed)
Received called from patient with concerns related to positive case in workplace; not identified as close contact but states he comes in direct contact for 34mins each day and they shake hands. Patient states that he does have diarrhea, chills at this time.

## 2019-04-25 NOTE — Progress Notes (Unsigned)
ab 

## 2019-04-26 ENCOUNTER — Telehealth: Payer: Self-pay

## 2019-04-26 LAB — NOVEL CORONAVIRUS, NAA: SARS-CoV-2, NAA: NOT DETECTED

## 2019-04-26 NOTE — Telephone Encounter (Signed)
Placed call to follow up to see how patient is feeling; waiting pending test results.

## 2019-04-28 ENCOUNTER — Telehealth: Payer: Self-pay

## 2019-04-28 NOTE — Telephone Encounter (Signed)
Received call from patient; COVID results negative, no further symptoms noted per patient. Cleared to return to work.

## 2020-04-14 IMAGING — CT CT HEAD W/O CM
3 series · 15 of 47 positions shown, 18 images · non-contrast
Comparison: 05/26/2010 head CT

CLINICAL DATA: Altered LOC

EXAM:
CT HEAD WITHOUT CONTRAST
TECHNIQUE: Contiguous axial images were obtained from the base of the skull
through the vertex without intravenous contrast.

[Series 3: head wo · axial · 0.46mm/px · z∈[+262,+387]mm · 9 of 31 slices shown, 12 images]
[im 3/31  brain]
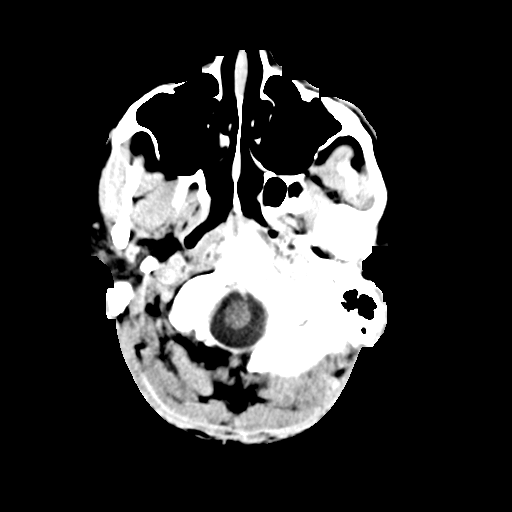
[im 3/31  bone]
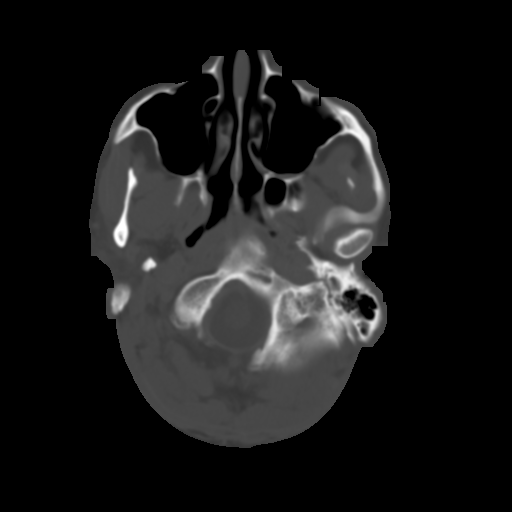
[im 6/31  brain]
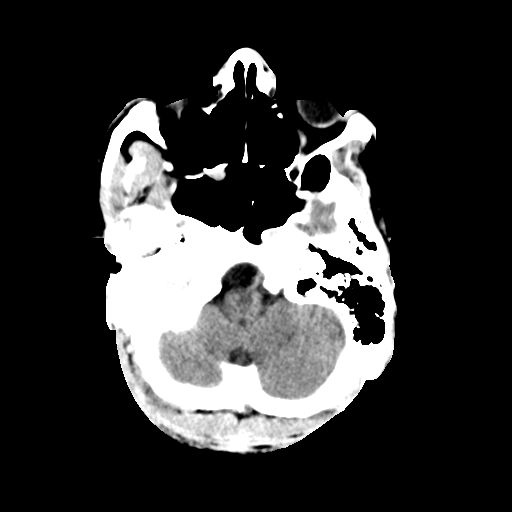
[im 9/31  brain]
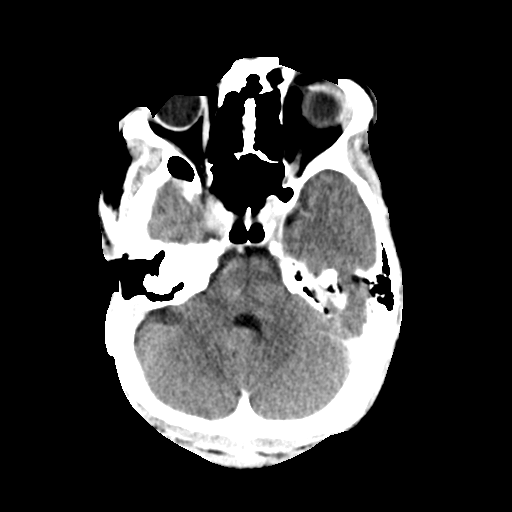
[im 12/31  brain]
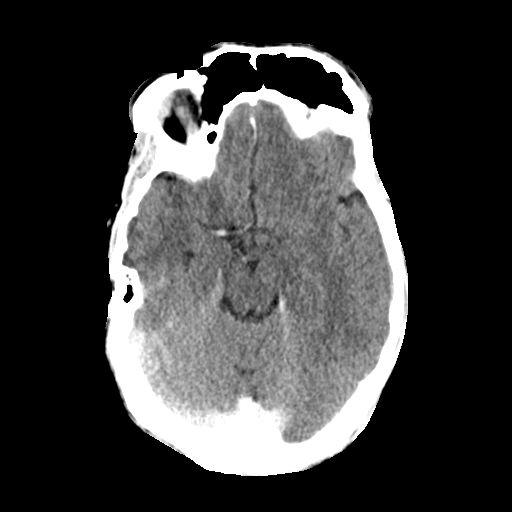
[im 16/31  brain]
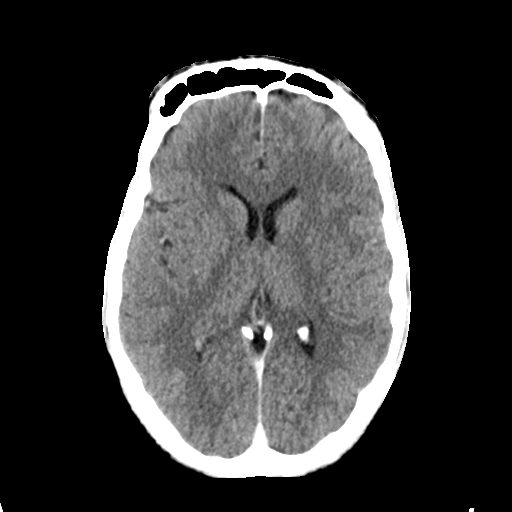
[im 16/31  bone]
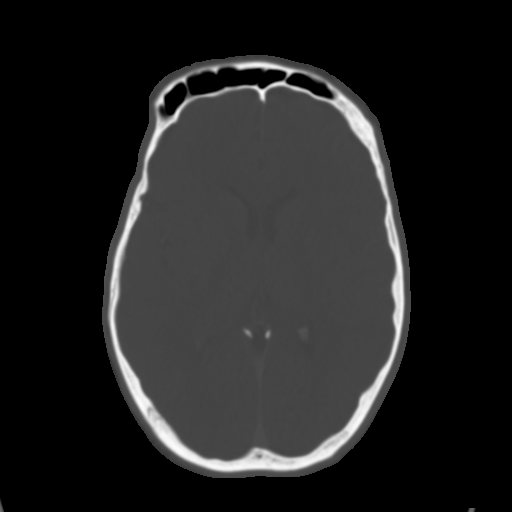
[im 19/31  brain]
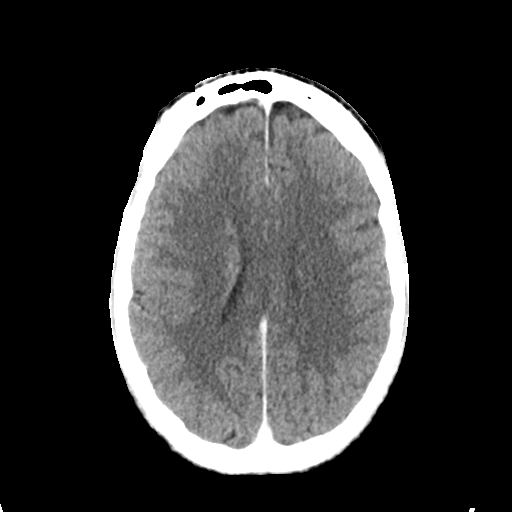
[im 22/31  brain]
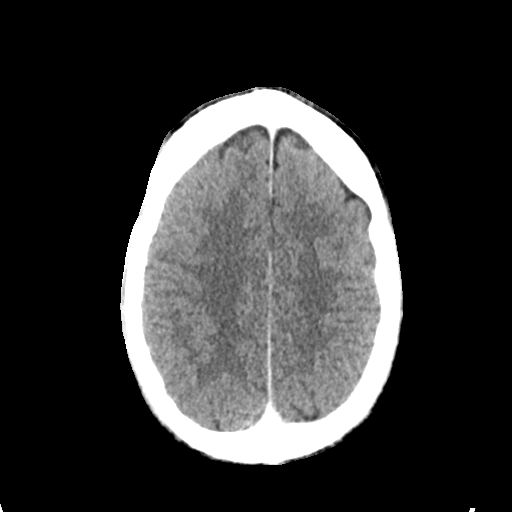
[im 25/31  brain]
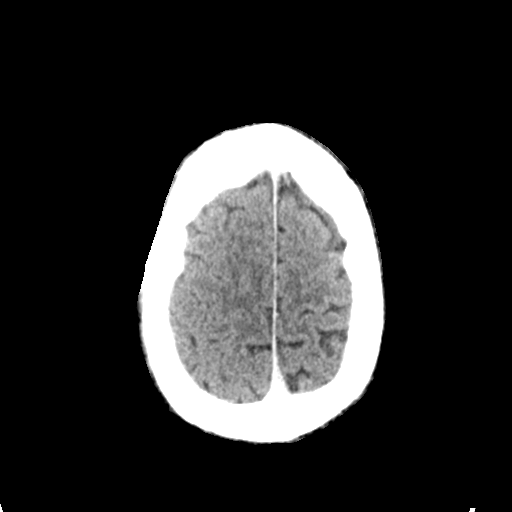
[im 28/31  brain]
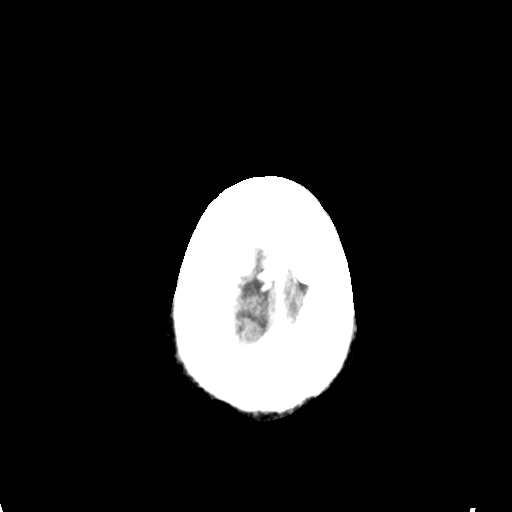
[im 28/31  bone]
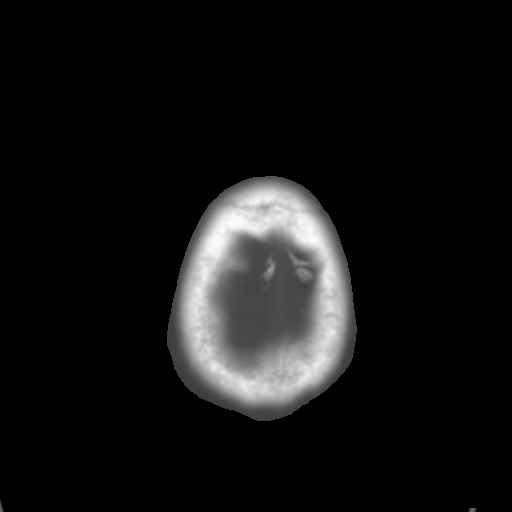

[Series 4: coronal soft tissue · coronal · 0.35mm/px · 3 of 75 slices shown]
[im 25/75  brain]
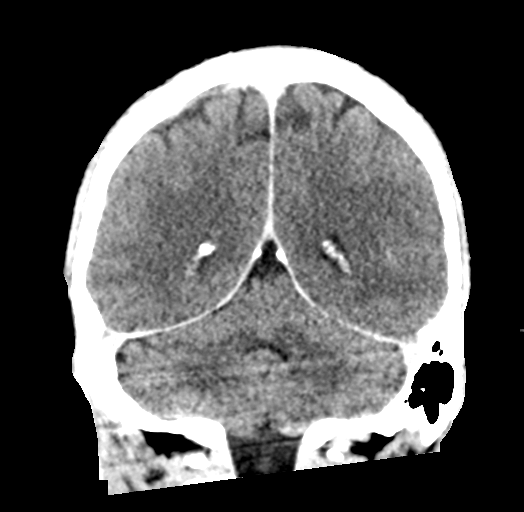
[im 33/75  brain]
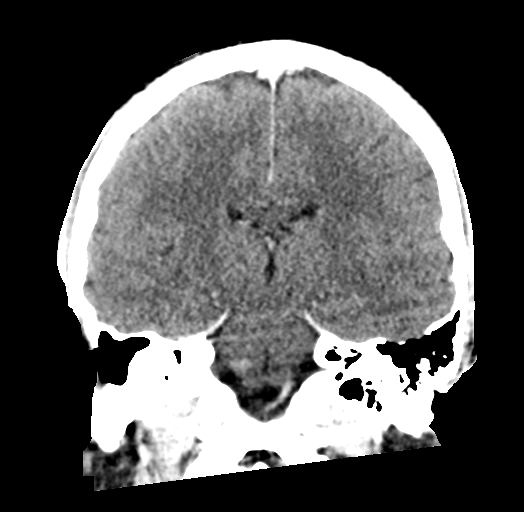
[im 42/75  brain]
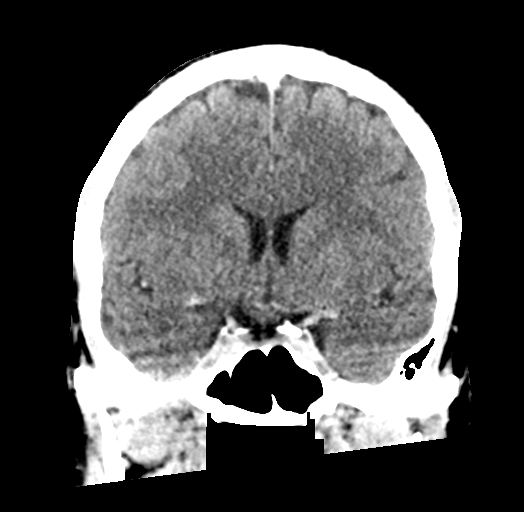

[Series 5: sagittal soft tissue · sagittal · 0.34mm/px · 3 of 54 slices shown]
[im 18/54  brain]
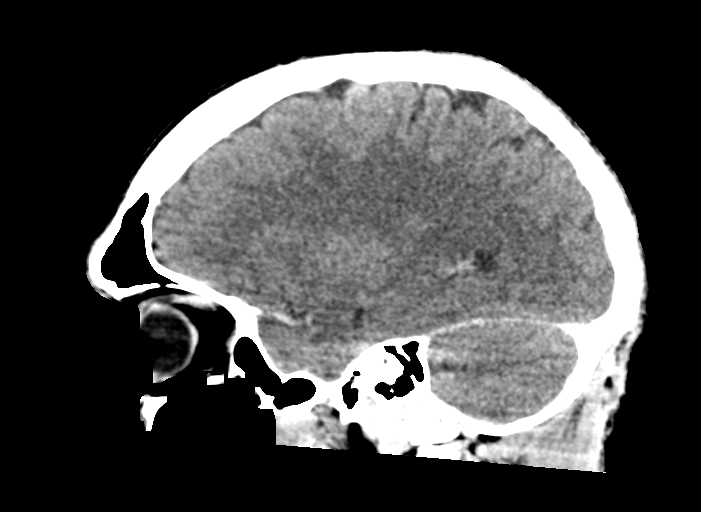
[im 27/54  brain]
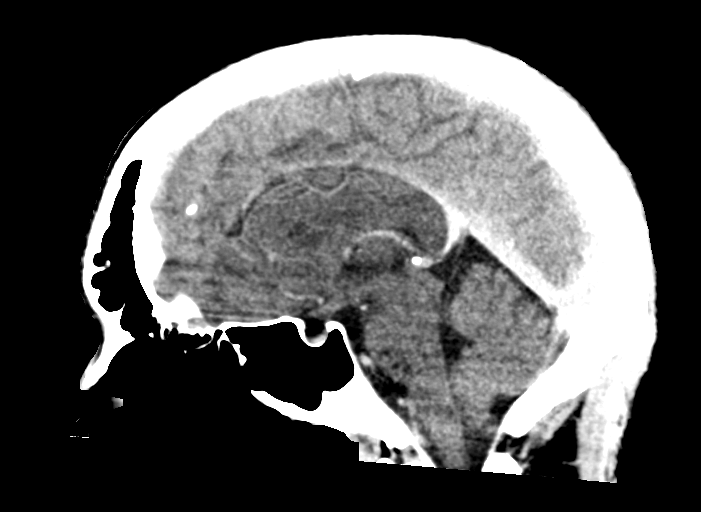
[im 36/54  brain]
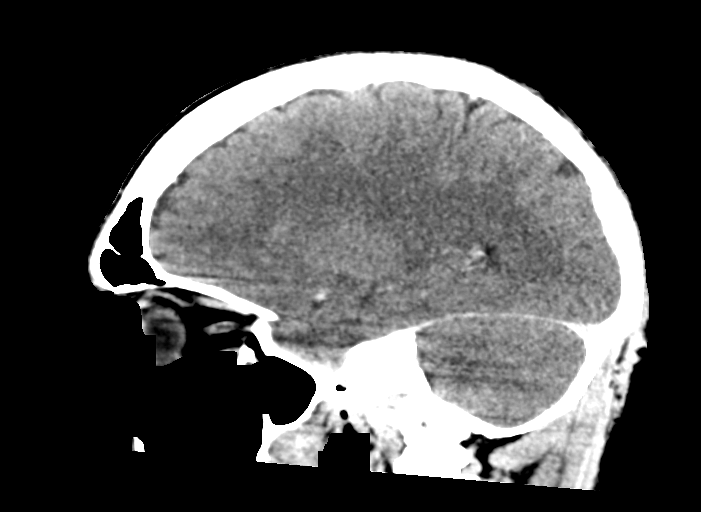

[15 of 47 positions shown; findings below may reference images not displayed]

FINDINGS: Brain: Study is limited by recent administration of intravascular
contrast. This limits evaluation for acute intracranial hemorrhage.
No mass or large vessel territorial infarct is visualized. The
ventricles are of normal size

Vascular: Increased vascular density consistent with recent contrast
administration. Increased density in the dural venous sinuses.

Skull: No fracture

Sinuses/Orbits: Mucous retention cyst in the right sphenoid sinus

Other: None
IMPRESSION: Slightly limited exam secondary to the presence of recently
administered intravenous contrast. Allowing for this, no definite
acute intracranial abnormality

## 2021-09-16 ENCOUNTER — Emergency Department
Admission: EM | Admit: 2021-09-16 | Discharge: 2021-09-16 | Disposition: A | Discharge status: Home / Self Care | Payer: Self-pay | Attending: Emergency Medicine | Admitting: Emergency Medicine

## 2021-09-16 ENCOUNTER — Other Ambulatory Visit: Payer: Self-pay

## 2021-09-16 ENCOUNTER — Encounter: Payer: Self-pay | Admitting: Emergency Medicine

## 2021-09-16 ENCOUNTER — Emergency Department: Payer: Self-pay

## 2021-09-16 DIAGNOSIS — Z20822 Contact with and (suspected) exposure to covid-19: Secondary | ICD-10-CM | POA: Insufficient documentation

## 2021-09-16 DIAGNOSIS — R1013 Epigastric pain: Secondary | ICD-10-CM | POA: Insufficient documentation

## 2021-09-16 DIAGNOSIS — R6883 Chills (without fever): Secondary | ICD-10-CM | POA: Insufficient documentation

## 2021-09-16 DIAGNOSIS — R112 Nausea with vomiting, unspecified: Secondary | ICD-10-CM | POA: Insufficient documentation

## 2021-09-16 DIAGNOSIS — R000 Tachycardia, unspecified: Secondary | ICD-10-CM | POA: Insufficient documentation

## 2021-09-16 LAB — URINALYSIS, ROUTINE W REFLEX MICROSCOPIC
Bilirubin Urine: NEGATIVE
Glucose, UA: NEGATIVE mg/dL
Ketones, ur: 5 mg/dL — AB
Leukocytes,Ua: NEGATIVE
Nitrite: NEGATIVE
Protein, ur: 100 mg/dL — AB
Specific Gravity, Urine: 1.026 (ref 1.005–1.030)
Squamous Epithelial / HPF: NONE SEEN (ref 0–5)
pH: 9 — ABNORMAL HIGH (ref 5.0–8.0)

## 2021-09-16 LAB — LACTIC ACID, PLASMA
Lactic Acid, Venous: 2.8 mmol/L (ref 0.5–1.9)
Lactic Acid, Venous: 2.5 mmol/L (ref 0.5–1.9)

## 2021-09-16 LAB — CBC WITH DIFFERENTIAL/PLATELET
Abs Immature Granulocytes: 0.1 10*3/uL — ABNORMAL HIGH (ref 0.00–0.07)
Basophils Absolute: 0.1 10*3/uL (ref 0.0–0.1)
Basophils Relative: 0 %
Eosinophils Absolute: 0 10*3/uL (ref 0.0–0.5)
Eosinophils Relative: 0 %
HCT: 51.5 % (ref 39.0–52.0)
Hemoglobin: 18.1 g/dL — ABNORMAL HIGH (ref 13.0–17.0)
Immature Granulocytes: 1 %
Lymphocytes Relative: 11 %
Lymphs Abs: 2.3 10*3/uL (ref 0.7–4.0)
MCH: 31.5 pg (ref 26.0–34.0)
MCHC: 35.1 g/dL (ref 30.0–36.0)
MCV: 89.7 fL (ref 80.0–100.0)
Monocytes Absolute: 1.7 10*3/uL — ABNORMAL HIGH (ref 0.1–1.0)
Monocytes Relative: 9 %
Neutro Abs: 16.1 10*3/uL — ABNORMAL HIGH (ref 1.7–7.7)
Neutrophils Relative %: 79 %
Platelets: 302 10*3/uL (ref 150–400)
RBC: 5.74 MIL/uL (ref 4.22–5.81)
RDW: 11.9 % (ref 11.5–15.5)
WBC: 20.3 10*3/uL — ABNORMAL HIGH (ref 4.0–10.5)
nRBC: 0 % (ref 0.0–0.2)

## 2021-09-16 LAB — COMPREHENSIVE METABOLIC PANEL
ALT: 19 U/L (ref 0–44)
AST: 36 U/L (ref 15–41)
Albumin: 5.2 g/dL — ABNORMAL HIGH (ref 3.5–5.0)
Alkaline Phosphatase: 73 U/L (ref 38–126)
Anion gap: 12 (ref 5–15)
BUN: 12 mg/dL (ref 6–20)
CO2: 24 mmol/L (ref 22–32)
Calcium: 10.6 mg/dL — ABNORMAL HIGH (ref 8.9–10.3)
Chloride: 104 mmol/L (ref 98–111)
Creatinine, Ser: 1.17 mg/dL (ref 0.61–1.24)
GFR, Estimated: >60 mL/min (ref >60)
Glucose, Bld: 154 mg/dL — ABNORMAL HIGH (ref 70–99)
Potassium: 4 mmol/L (ref 3.5–5.1)
Sodium: 140 mmol/L (ref 135–145)
Total Bilirubin: 1.7 mg/dL — ABNORMAL HIGH (ref 0.3–1.2)
Total Protein: 9.6 g/dL — ABNORMAL HIGH (ref 6.5–8.1)

## 2021-09-16 LAB — LIPASE, BLOOD: Lipase: 24 U/L (ref 11–51)

## 2021-09-16 LAB — RESP PANEL BY RT-PCR (FLU A&B, COVID) ARPGX2
Influenza A by PCR: NEGATIVE
Influenza B by PCR: NEGATIVE
SARS Coronavirus 2 by RT PCR: NEGATIVE

## 2021-09-16 MED ORDER — SODIUM CHLORIDE 0.9 % IV SOLN
12.5000 mg | Freq: Once | INTRAVENOUS | Status: AC
  Administered 2021-09-16: 12.5 mg via INTRAVENOUS
  Filled 2021-09-16: qty 12.5

## 2021-09-16 MED ORDER — SODIUM CHLORIDE 0.9 % IV BOLUS
2000.0000 mL | Freq: Once | INTRAVENOUS | Status: AC
Start: 2021-09-16 — End: 2021-09-16
  Administered 2021-09-16: 2000 mL via INTRAVENOUS

## 2021-09-16 MED ORDER — PROMETHAZINE HCL 12.5 MG PO TABS: 12.5000 mg | ORAL_TABLET | Freq: Four times a day (QID) | ORAL | 0 refills | Status: AC | PRN

## 2021-09-16 MED ORDER — METOCLOPRAMIDE HCL 5 MG/ML IJ SOLN
10.0000 mg | Freq: Once | INTRAMUSCULAR | Status: AC
  Administered 2021-09-16: 10 mg via INTRAVENOUS
  Filled 2021-09-16: qty 2

## 2021-09-16 MED ORDER — SODIUM CHLORIDE 0.9 % IV SOLN
12.5000 mg | Freq: Four times a day (QID) | INTRAVENOUS | Status: DC | PRN
  Administered 2021-09-16: 12.5 mg via INTRAVENOUS
  Filled 2021-09-16 (×2): qty 0.5

## 2021-09-16 NOTE — ED Triage Notes (Signed)
C/O mid abdominal, lower back pain with N/V x 2 days.  Some diarrhea x 1 day.  Denies dysuria.

## 2021-09-16 NOTE — ED Provider Notes (Signed)
[facility] Provider Note   Event Date/Time   First MD Initiated Contact with Patient 09/16/21 [phone]     (approximate)  History   Emesis  HPI Brandon Allen is a 41 y.o. adult male with a past medical history of cyclic vomiting syndrome who presents for nausea/vomiting/abdominal pain over the last 2 days.  Patient describes 9/10 burning epigastric pain that radiates into the back of his throat and is worsened with emesis.  Patient denies any hematemesis, melena, hematochezia, diarrhea.  Patient denies any recent travel or sick contacts.  Patient denies any food out of the ordinary.  Patient endorses subjective chills without fever.    Physical Exam   Triage Vital Signs: ED Triage Vitals  Enc Vitals Group     BP 09/16/21 0741 (!) 197/131     Pulse Rate 09/16/21 0741 (!) 120     Resp 09/16/21 0741 14     Temp 09/16/21 0743 98.6 F (37 C)     Temp Source 09/16/21 0741 Oral     SpO2 09/16/21 0741 99 %     Weight 09/16/21 0736 205 lb (93 kg)     Height 09/16/21 0736 6' (1.829 m)     Head Circumference --      Peak Flow --      Pain Score 09/16/21 0736 9     Pain Loc --      Pain Edu? --      Excl. in GC? --     Most recent vital signs: Vitals:   09/16/21 1148 09/16/21 1200  BP:  (!) 169/97  Pulse: 89 (!) 107  Resp: (!) 21 15  Temp:    SpO2: 98% 97%    General: Awake, oriented x4. CV:  Good peripheral perfusion.  Resp:  Normal effort.  Abd:  No distention.  Other:  Healthy-appearing middle-aged African-American male laying in bed in no distress   ED Results / Procedures / Treatments   Labs (all labs ordered are listed, but only abnormal results are displayed) Labs Reviewed  COMPREHENSIVE METABOLIC PANEL - Abnormal; Notable for the following components:      Result Value   Glucose, Bld 154 (*)    Calcium 10.6 (*)    Total Protein 9.6 (*)    Albumin 5.2 (*)    Total Bilirubin 1.7 (*)    All other components within normal limits  LACTIC  ACID, PLASMA - Abnormal; Notable for the following components:   Lactic Acid, Venous 2.8 (*)    All other components within normal limits  LACTIC ACID, PLASMA - Abnormal; Notable for the following components:   Lactic Acid, Venous 2.5 (*)    All other components within normal limits  CBC WITH DIFFERENTIAL/PLATELET - Abnormal; Notable for the following components:   WBC 20.3 (*)    Hemoglobin 18.1 (*)    Neutro Abs 16.1 (*)    Monocytes Absolute 1.7 (*)    Abs Immature Granulocytes 0.10 (*)    All other components within normal limits  URINALYSIS, ROUTINE W REFLEX MICROSCOPIC - Abnormal; Notable for the following components:   Color, Urine YELLOW (*)    APPearance CLEAR (*)    pH 9.0 (*)    Hgb urine dipstick SMALL (*)    Ketones, ur 5 (*)    Protein, ur 100 (*)    Bacteria, UA RARE (*)    All other components within normal limits  RESP PANEL BY RT-PCR (FLU A&B, COVID) ARPGX2  LIPASE,  BLOOD     EKG ED ECG REPORT I, Merwyn Katos, the attending physician, personally viewed and interpreted this ECG.  Date: 09/16/2021 EKG Time: 0739 Rate: 125 Rhythm: Tachycardic sinus rhythm QRS Axis: normal Intervals: normal ST/T Wave abnormalities: normal Narrative Interpretation: Tachycardic sinus rhythm.  No evidence of acute ischemia   RADIOLOGY ED MD interpretation: Due to renal stone study shows no evidence of intra-abdominal process -Agree with radiology assessment Official radiology report(s): CT Renal Stone Study  Result Date: 09/16/2021 CLINICAL DATA:  Abdominal pain radiating to the back since Monday with nausea and vomiting. EXAM: CT ABDOMEN AND PELVIS WITHOUT CONTRAST TECHNIQUE: Multidetector CT imaging of the abdomen and pelvis was performed following the standard protocol without IV contrast. RADIATION DOSE REDUCTION: This exam was performed according to the departmental dose-optimization program which includes automated exposure control, adjustment of the mA and/or kV  according to patient size and/or use of iterative reconstruction technique. COMPARISON:  CT abdomen and pelvis dated November 19, 2018. FINDINGS: Lower chest: No acute abnormality. Hepatobiliary: No focal liver abnormality is seen. Status post cholecystectomy. No biliary dilatation. Pancreas: Unremarkable. No pancreatic ductal dilatation or surrounding inflammatory changes. Spleen: Normal in size without focal abnormality. Adrenals/Urinary Tract: Adrenal glands are unremarkable. Kidneys are normal, without renal calculi, focal lesion, or hydronephrosis. Bladder is unremarkable. Stomach/Bowel: Stomach is within normal limits. Appendix appears normal. No evidence of bowel wall thickening, distention, or inflammatory changes. Vascular/Lymphatic: No significant vascular findings are present. No enlarged abdominal or pelvic lymph nodes. Reproductive: Prostate is unremarkable. Other: No abdominal wall hernia or abnormality. No abdominopelvic ascites. No pneumoperitoneum. Musculoskeletal: No acute or significant osseous findings. IMPRESSION: 1. No acute intra-abdominal process. Electronically Signed   By: Obie Dredge M.D.   On: 09/16/2021 08:41      PROCEDURES:  Critical Care performed: No  .1-3 Lead EKG Interpretation Performed by: Merwyn Katos, MD Authorized by: Merwyn Katos, MD     Interpretation: abnormal     ECG rate:  107   ECG rate assessment: tachycardic     Rhythm: sinus tachycardia     Ectopy: none     Conduction: normal     MEDICATIONS ORDERED IN ED: Medications  promethazine (PHENERGAN) 12.5 mg in sodium chloride 0.9 % 50 mL IVPB (12.5 mg Intravenous New Bag/Given 09/16/21 1202)  sodium chloride 0.9 % bolus 2,000 mL (2,000 mLs Intravenous New Bag/Given 09/16/21 0751)  metoCLOPramide (REGLAN) injection 10 mg (10 mg Intravenous Given 09/16/21 0751)  promethazine (PHENERGAN) 12.5 mg in sodium chloride 0.9 % 50 mL IVPB (0 mg Intravenous Stopped 09/16/21 0848)     IMPRESSION / MDM /  ASSESSMENT AND PLAN / ED COURSE  I reviewed the triage vital signs and the nursing notes.                              The patient is on the cardiac monitor to evaluate for evidence of arrhythmia and/or significant heart rate changes. Patient presents for acute nausea/vomiting The cause of the patients symptoms is not clear, but the patient is overall well appearing and is suspected to have a transient course of illness.  Given History and Exam there does not appear to be an emergent cause of the symptoms such as small bowel obstruction, coronary syndrome, bowel ischemia, DKA, pancreatitis, appendicitis, other acute abdomen or other emergent problem.  Reassessment: After treatment, the patient is feeling much better, tolerating PO fluids, and shows no  signs of dehydration.   Disposition: Discharge home with prompt primary care physician follow up in the next 48 hours. Strict return precautions discussed.      FINAL CLINICAL IMPRESSION(S) / ED DIAGNOSES   Final diagnoses:  Nausea and vomiting, unspecified vomiting type     Rx / DC Orders   ED Discharge Orders          Ordered    promethazine (PHENERGAN) 12.5 MG tablet  Every 6 hours PRN        09/16/21 1301             Note:  This document was prepared using Dragon voice recognition software and may include unintentional dictation errors.   Merwyn Katos, MD 09/16/21 [phone]

## 2021-09-16 NOTE — ED Notes (Signed)
Continued to encourage the pt to give a urine speciment

## 2021-09-16 NOTE — ED Notes (Signed)
Patient transported to CT 

## 2021-09-16 NOTE — ED Notes (Signed)
Pt returned from CT, placed back on the monitor, pt is feeling better since medication administered, wife is at the bedside, pt repositioned for comfort

## 2021-09-16 NOTE — ED Notes (Signed)
Pt c/o mid/umbilical pain that radiates into the back since Monday morning with N/V and some episodes of diarrhea. Pt has a hx of hyperemesis with cannabis use, states he has not used since Sunday. Pt is hyperventilating on arrival, medication and fluids given as ordered, will continue to monitor the pt
